# Patient Record
Sex: Female | Born: 1940 | Race: Black or African American | Hispanic: No | Smoking: Never smoker
Health system: Southern US, Community
[De-identification: ages and names within clinical notes are randomized; demographics above are authoritative.]

## PROBLEM LIST (undated history)

## (undated) DIAGNOSIS — I10 Essential (primary) hypertension: Secondary | ICD-10-CM

## (undated) DIAGNOSIS — L309 Dermatitis, unspecified: Secondary | ICD-10-CM

## (undated) DIAGNOSIS — R7303 Prediabetes: Secondary | ICD-10-CM

## (undated) DIAGNOSIS — H409 Unspecified glaucoma: Secondary | ICD-10-CM

## (undated) DIAGNOSIS — R202 Paresthesia of skin: Secondary | ICD-10-CM

## (undated) DIAGNOSIS — Z9289 Personal history of other medical treatment: Secondary | ICD-10-CM

## (undated) DIAGNOSIS — T4145XA Adverse effect of unspecified anesthetic, initial encounter: Secondary | ICD-10-CM

## (undated) DIAGNOSIS — M1990 Unspecified osteoarthritis, unspecified site: Secondary | ICD-10-CM

## (undated) DIAGNOSIS — R413 Other amnesia: Secondary | ICD-10-CM

## (undated) DIAGNOSIS — Z9109 Other allergy status, other than to drugs and biological substances: Secondary | ICD-10-CM

## (undated) DIAGNOSIS — J45909 Unspecified asthma, uncomplicated: Secondary | ICD-10-CM

## (undated) DIAGNOSIS — E785 Hyperlipidemia, unspecified: Secondary | ICD-10-CM

## (undated) DIAGNOSIS — T8859XA Other complications of anesthesia, initial encounter: Secondary | ICD-10-CM

## (undated) DIAGNOSIS — N6320 Unspecified lump in the left breast, unspecified quadrant: Principal | ICD-10-CM

## (undated) HISTORY — PX: BREAST CYST ASPIRATION: SHX578

## (undated) HISTORY — DX: Essential (primary) hypertension: I10

## (undated) HISTORY — PX: GANGLION CYST EXCISION: SHX1691

## (undated) HISTORY — PX: DILATION AND CURETTAGE OF UTERUS: SHX78

## (undated) HISTORY — DX: Unspecified lump in the left breast, unspecified quadrant: N63.20

## (undated) HISTORY — PX: BREAST BIOPSY: SHX20

## (undated) HISTORY — DX: Other amnesia: R41.3

## (undated) HISTORY — PX: CERVICAL LAMINECTOMY: SHX94

---

## 2000-01-11 ENCOUNTER — Ambulatory Visit (HOSPITAL_BASED_OUTPATIENT_CLINIC_OR_DEPARTMENT_OTHER): Admission: RE | Admit: 2000-01-11 | Discharge: 2000-01-11 | Payer: Self-pay | Admitting: Orthopedic Surgery

## 2000-04-19 ENCOUNTER — Encounter: Payer: Self-pay | Admitting: Emergency Medicine

## 2000-04-19 ENCOUNTER — Emergency Department (HOSPITAL_COMMUNITY): Admission: EM | Admit: 2000-04-19 | Discharge: 2000-04-19 | Payer: Self-pay | Admitting: Emergency Medicine

## 2000-05-15 ENCOUNTER — Encounter: Admission: RE | Admit: 2000-05-15 | Discharge: 2000-06-02 | Payer: Self-pay | Admitting: Orthopaedic Surgery

## 2000-06-15 ENCOUNTER — Encounter: Admission: RE | Admit: 2000-06-15 | Discharge: 2000-07-13 | Payer: Self-pay | Admitting: Orthopaedic Surgery

## 2005-03-21 HISTORY — PX: JOINT REPLACEMENT: SHX530

## 2005-03-23 ENCOUNTER — Inpatient Hospital Stay (HOSPITAL_COMMUNITY): Admission: RE | Admit: 2005-03-23 | Discharge: 2005-03-28 | Payer: Self-pay | Admitting: Orthopedic Surgery

## 2005-03-23 ENCOUNTER — Ambulatory Visit: Payer: Self-pay | Admitting: Physical Medicine & Rehabilitation

## 2005-03-28 ENCOUNTER — Inpatient Hospital Stay
Admission: RE | Admit: 2005-03-28 | Discharge: 2005-04-02 | Payer: Self-pay | Admitting: Physical Medicine & Rehabilitation

## 2005-05-17 ENCOUNTER — Encounter: Admission: RE | Admit: 2005-05-17 | Discharge: 2005-07-06 | Payer: Self-pay | Admitting: Orthopedic Surgery

## 2012-06-08 ENCOUNTER — Ambulatory Visit
Admission: RE | Admit: 2012-06-08 | Discharge: 2012-06-08 | Disposition: A | Discharge status: Home / Self Care | Payer: Self-pay | Source: Ambulatory Visit | Attending: Internal Medicine | Admitting: Internal Medicine

## 2012-06-08 ENCOUNTER — Other Ambulatory Visit: Payer: Self-pay | Admitting: Internal Medicine

## 2012-06-08 DIAGNOSIS — R05 Cough: Secondary | ICD-10-CM

## 2012-06-08 DIAGNOSIS — R059 Cough, unspecified: Secondary | ICD-10-CM

## 2014-10-27 ENCOUNTER — Other Ambulatory Visit (HOSPITAL_COMMUNITY): Payer: Self-pay | Admitting: Orthopaedic Surgery

## 2014-11-11 NOTE — Patient Instructions (Addendum)
YOUR PROCEDURE IS SCHEDULED ON : 11/21/14  REPORT TO [facility] MAIN ENTRANCE FOLLOW SIGNS TO EAST ELEVATOR - GO TO 3rd FLOOR CHECK IN AT 3 EAST NURSES STATION (SHORT STAY) AT: 10:30 AM  CALL THIS NUMBER IF YOU HAVE PROBLEMS THE MORNING OF SURGERY [phone]  REMEMBER:ONLY 1 PER PERSON MAY GO TO SHORT STAY WITH YOU TO GET READY THE MORNING OF YOUR SURGERY  DO NOT EAT FOOD OR DRINK LIQUIDS AFTER MIDNIGHT  TAKE THESE MEDICINES THE MORNING OF SURGERY: NIFEDIPINE / USE EYE DROPS AS NORMAL  YOU MAY NOT HAVE ANY METAL ON YOUR BODY INCLUDING HAIR PINS AND PIERCING'S. DO NOT WEAR JEWELRY, MAKEUP, LOTIONS, POWDERS OR PERFUMES. DO NOT WEAR NAIL POLISH. DO NOT SHAVE 48 HRS PRIOR TO SURGERY. MEN MAY SHAVE FACE AND NECK.  DO NOT [place]. [facility] IS NOT RESPONSIBLE FOR VALUABLES.  CONTACTS, DENTURES OR PARTIALS MAY NOT BE WORN TO SURGERY. LEAVE SUITCASE IN CAR. CAN BE BROUGHT TO ROOM AFTER SURGERY.  PATIENTS DISCHARGED THE DAY OF SURGERY WILL NOT BE ALLOWED TO DRIVE HOME.  PLEASE READ OVER THE FOLLOWING INSTRUCTION SHEETS _________________________________________________________________________________                                          [place] - PREPARING FOR SURGERY  Before surgery, you can play an important role.  Because skin is not sterile, your skin needs to be as free of germs as possible.  You can reduce the number of germs on your skin by washing with CHG (chlorahexidine gluconate) soap before surgery.  CHG is an antiseptic cleaner which kills germs and bonds with the skin to continue killing germs even after washing. Please DO NOT use if you have an allergy to CHG or antibacterial soaps.  If your skin becomes reddened/irritated stop using the CHG and inform your nurse when you arrive at Short Stay. Do not shave (including legs and underarms) for at least 48 hours prior to the first CHG shower.  You may shave your face. Please follow  these instructions carefully:   1.  Shower with CHG Soap the night before surgery and the  morning of Surgery.   2.  If you choose to wash your hair, wash your hair first as usual with your  normal  Shampoo.   3.  After you shampoo, rinse your hair and body thoroughly to remove the  shampoo.                                         4.  Use CHG as you would any other liquid soap.  You can apply chg directly  to the skin and wash . Gently wash with scrungie or clean wascloth    5.  Apply the CHG Soap to your body ONLY FROM THE NECK DOWN.   Do not use on open                           Wound or open sores. Avoid contact with eyes, ears mouth and genitals (private parts).                        Genitals (private parts) with your  normal soap.              6.  Wash thoroughly, paying special attention to the area where your surgery  will be performed.   7.  Thoroughly rinse your body with warm water from the neck down.   8.  DO NOT shower/wash with your normal soap after using and rinsing off  the CHG Soap .                9.  Pat yourself dry with a clean towel.             10.  Wear clean night clothes to bed after shower             11.  Place clean sheets on your bed the night of your first shower and do not  sleep with pets.  Day of Surgery : Do not apply any lotions/deodorants the morning of surgery.  Please wear clean clothes to the hospital/surgery center.  FAILURE TO FOLLOW THESE INSTRUCTIONS MAY RESULT IN THE CANCELLATION OF YOUR SURGERY    PATIENT SIGNATURE_________________________________  ______________________________________________________________________

## 2014-11-12 ENCOUNTER — Encounter (HOSPITAL_COMMUNITY): Payer: Self-pay

## 2014-11-12 ENCOUNTER — Encounter (HOSPITAL_COMMUNITY)
Admission: RE | Admit: 2014-11-12 | Discharge: 2014-11-12 | Disposition: A | Discharge status: Home / Self Care | Payer: Medicare Other | Source: Ambulatory Visit | Attending: Orthopaedic Surgery | Admitting: Orthopaedic Surgery

## 2014-11-12 ENCOUNTER — Encounter (HOSPITAL_COMMUNITY): Payer: Self-pay | Admitting: *Deleted

## 2014-11-12 ENCOUNTER — Other Ambulatory Visit: Payer: Self-pay

## 2014-11-12 DIAGNOSIS — J45909 Unspecified asthma, uncomplicated: Secondary | ICD-10-CM | POA: Diagnosis not present

## 2014-11-12 DIAGNOSIS — H409 Unspecified glaucoma: Secondary | ICD-10-CM | POA: Insufficient documentation

## 2014-11-12 DIAGNOSIS — E119 Type 2 diabetes mellitus without complications: Secondary | ICD-10-CM | POA: Diagnosis not present

## 2014-11-12 DIAGNOSIS — Z01812 Encounter for preprocedural laboratory examination: Secondary | ICD-10-CM | POA: Diagnosis present

## 2014-11-12 DIAGNOSIS — I10 Essential (primary) hypertension: Secondary | ICD-10-CM | POA: Insufficient documentation

## 2014-11-12 DIAGNOSIS — Z01810 Encounter for preprocedural cardiovascular examination: Secondary | ICD-10-CM | POA: Insufficient documentation

## 2014-11-12 DIAGNOSIS — L309 Dermatitis, unspecified: Secondary | ICD-10-CM | POA: Diagnosis not present

## 2014-11-12 DIAGNOSIS — E785 Hyperlipidemia, unspecified: Secondary | ICD-10-CM | POA: Diagnosis not present

## 2014-11-12 HISTORY — DX: Other complications of anesthesia, initial encounter: T88.59XA

## 2014-11-12 HISTORY — DX: Unspecified asthma, uncomplicated: J45.909

## 2014-11-12 HISTORY — DX: Personal history of other medical treatment: Z92.89

## 2014-11-12 HISTORY — DX: Unspecified osteoarthritis, unspecified site: M19.90

## 2014-11-12 HISTORY — DX: Paresthesia of skin: R20.2

## 2014-11-12 HISTORY — DX: Dermatitis, unspecified: L30.9

## 2014-11-12 HISTORY — DX: Hyperlipidemia, unspecified: E78.5

## 2014-11-12 HISTORY — DX: Adverse effect of unspecified anesthetic, initial encounter: T41.45XA

## 2014-11-12 HISTORY — DX: Other allergy status, other than to drugs and biological substances: Z91.09

## 2014-11-12 HISTORY — DX: Prediabetes: R73.03

## 2014-11-12 HISTORY — DX: Essential (primary) hypertension: I10

## 2014-11-12 LAB — BASIC METABOLIC PANEL
ANION GAP: 10 (ref 5–15)
BUN: 10 mg/dL (ref 6–20)
CALCIUM: 8.8 mg/dL — AB (ref 8.9–10.3)
CO2: 28 mmol/L (ref 22–32)
CREATININE: 0.61 mg/dL (ref 0.44–1.00)
Chloride: 104 mmol/L (ref 101–111)
GFR calc Af Amer: >60 mL/min (ref >60)
GLUCOSE: 109 mg/dL — AB (ref 65–99)
Potassium: 3.8 mmol/L (ref 3.5–5.1)
Sodium: 142 mmol/L (ref 135–145)

## 2014-11-12 LAB — APTT: APTT: 32 s (ref 24–37)

## 2014-11-12 LAB — CBC
HCT: 40.4 % (ref 36.0–46.0)
HEMOGLOBIN: 13.1 g/dL (ref 12.0–15.0)
MCH: 28.6 pg (ref 26.0–34.0)
MCHC: 32.4 g/dL (ref 30.0–36.0)
MCV: 88.2 fL (ref 78.0–100.0)
Platelets: 227 10*3/uL (ref 150–400)
RBC: 4.58 MIL/uL (ref 3.87–5.11)
RDW: 14.2 % (ref 11.5–15.5)
WBC: 4.4 10*3/uL (ref 4.0–10.5)

## 2014-11-12 LAB — ABO/RH: ABO/RH(D): O POS

## 2014-11-12 LAB — PROTIME-INR
INR: 1.23 (ref 0.00–1.49)
Prothrombin Time: 15.7 seconds — ABNORMAL HIGH (ref 11.6–15.2)

## 2014-11-12 LAB — SURGICAL PCR SCREEN
MRSA, PCR: NEGATIVE
STAPHYLOCOCCUS AUREUS: NEGATIVE

## 2014-11-13 ENCOUNTER — Encounter (HOSPITAL_COMMUNITY): Payer: Self-pay

## 2014-11-13 NOTE — Progress Notes (Signed)
EKG reviewed by Dr.Turk - no further action needed at this time

## 2014-11-21 ENCOUNTER — Inpatient Hospital Stay (HOSPITAL_COMMUNITY): Payer: Medicare Other

## 2014-11-21 ENCOUNTER — Encounter (HOSPITAL_COMMUNITY)
Admission: AD | Disposition: A | Discharge status: Skilled Nursing Facility | Payer: Self-pay | Source: Ambulatory Visit | Attending: Orthopaedic Surgery

## 2014-11-21 ENCOUNTER — Inpatient Hospital Stay (HOSPITAL_COMMUNITY): Payer: Medicare Other | Admitting: Anesthesiology

## 2014-11-21 ENCOUNTER — Inpatient Hospital Stay (HOSPITAL_COMMUNITY)
Admission: AD | Admit: 2014-11-21 | Discharge: 2014-11-25 | DRG: 470 | Disposition: A | Discharge status: Skilled Nursing Facility | Payer: Medicare Other | Source: Ambulatory Visit | Attending: Orthopaedic Surgery | Admitting: Orthopaedic Surgery

## 2014-11-21 ENCOUNTER — Encounter (HOSPITAL_COMMUNITY): Payer: Self-pay | Admitting: *Deleted

## 2014-11-21 DIAGNOSIS — E785 Hyperlipidemia, unspecified: Secondary | ICD-10-CM | POA: Diagnosis present

## 2014-11-21 DIAGNOSIS — Z419 Encounter for procedure for purposes other than remedying health state, unspecified: Secondary | ICD-10-CM

## 2014-11-21 DIAGNOSIS — Z01812 Encounter for preprocedural laboratory examination: Secondary | ICD-10-CM

## 2014-11-21 DIAGNOSIS — I10 Essential (primary) hypertension: Secondary | ICD-10-CM | POA: Diagnosis present

## 2014-11-21 DIAGNOSIS — Z96641 Presence of right artificial hip joint: Secondary | ICD-10-CM

## 2014-11-21 DIAGNOSIS — D62 Acute posthemorrhagic anemia: Secondary | ICD-10-CM | POA: Diagnosis not present

## 2014-11-21 DIAGNOSIS — M1611 Unilateral primary osteoarthritis, right hip: Principal | ICD-10-CM | POA: Diagnosis present

## 2014-11-21 DIAGNOSIS — Z96642 Presence of left artificial hip joint: Secondary | ICD-10-CM | POA: Diagnosis present

## 2014-11-21 DIAGNOSIS — M25551 Pain in right hip: Secondary | ICD-10-CM | POA: Diagnosis present

## 2014-11-21 DIAGNOSIS — R52 Pain, unspecified: Secondary | ICD-10-CM

## 2014-11-21 HISTORY — DX: Unspecified glaucoma: H40.9

## 2014-11-21 HISTORY — PX: TOTAL HIP ARTHROPLASTY: SHX124

## 2014-11-21 LAB — PROTIME-INR
INR: 1.17 (ref 0.00–1.49)
PROTHROMBIN TIME: 15.1 s (ref 11.6–15.2)

## 2014-11-21 LAB — GLUCOSE, CAPILLARY: GLUCOSE-CAPILLARY: 101 mg/dL — AB (ref 65–99)

## 2014-11-21 LAB — TYPE AND SCREEN
ABO/RH(D): O POS
ANTIBODY SCREEN: NEGATIVE

## 2014-11-21 SURGERY — ARTHROPLASTY, HIP, TOTAL, ANTERIOR APPROACH
Anesthesia: Spinal | Site: Hip | Laterality: Right

## 2014-11-21 MED ORDER — METOCLOPRAMIDE HCL 10 MG PO TABS: 5.0000 mg | ORAL_TABLET | Freq: Three times a day (TID) | ORAL | Status: DC | PRN

## 2014-11-21 MED ORDER — ALUM & MAG HYDROXIDE-SIMETH 200-200-20 MG/5ML PO SUSP: 30.0000 mL | ORAL | Status: DC | PRN

## 2014-11-21 MED ORDER — DORZOLAMIDE HCL-TIMOLOL MAL 2-0.5 % OP SOLN
1.0000 [drp] | Freq: Two times a day (BID) | OPHTHALMIC | Status: DC
  Administered 2014-11-21 – 2014-11-25 (×8): 1 [drp] via OPHTHALMIC
  Filled 2014-11-21: qty 10

## 2014-11-21 MED ORDER — NIFEDIPINE ER 30 MG PO TB24
30.0000 mg | ORAL_TABLET | Freq: Every morning | ORAL | Status: DC
  Administered 2014-11-22 – 2014-11-25 (×4): 30 mg via ORAL
  Filled 2014-11-21 (×5): qty 1

## 2014-11-21 MED ORDER — MENTHOL 3 MG MT LOZG: 1.0000 | LOZENGE | OROMUCOSAL | Status: DC | PRN

## 2014-11-21 MED ORDER — LIDOCAINE HCL (CARDIAC) 20 MG/ML IV SOLN
INTRAVENOUS | Status: AC
  Filled 2014-11-21: qty 5

## 2014-11-21 MED ORDER — FENTANYL CITRATE (PF) 100 MCG/2ML IJ SOLN
25.0000 ug | INTRAMUSCULAR | Status: DC | PRN
  Administered 2014-11-21 (×2): 25 ug via INTRAVENOUS

## 2014-11-21 MED ORDER — MOMETASONE FURO-FORMOTEROL FUM 100-5 MCG/ACT IN AERO
2.0000 | INHALATION_SPRAY | Freq: Two times a day (BID) | RESPIRATORY_TRACT | Status: DC
  Administered 2014-11-21 – 2014-11-25 (×8): 2 via RESPIRATORY_TRACT
  Filled 2014-11-21: qty 8.8

## 2014-11-21 MED ORDER — LACTATED RINGERS IV SOLN: INTRAVENOUS | Status: DC

## 2014-11-21 MED ORDER — SUCCINYLCHOLINE CHLORIDE 20 MG/ML IJ SOLN
INTRAMUSCULAR | Status: DC | PRN
  Administered 2014-11-21: 100 mg via INTRAVENOUS

## 2014-11-21 MED ORDER — ONDANSETRON HCL 4 MG PO TABS: 4.0000 mg | ORAL_TABLET | Freq: Four times a day (QID) | ORAL | Status: DC | PRN

## 2014-11-21 MED ORDER — OXYCODONE HCL 5 MG PO TABS
5.0000 mg | ORAL_TABLET | ORAL | Status: DC | PRN
  Administered 2014-11-21: 5 mg via ORAL
  Administered 2014-11-21: 10 mg via ORAL
  Administered 2014-11-22 (×2): 5 mg via ORAL
  Filled 2014-11-21 (×2): qty 2
  Filled 2014-11-21 (×3): qty 1

## 2014-11-21 MED ORDER — PHENYLEPHRINE HCL 10 MG/ML IJ SOLN
INTRAMUSCULAR | Status: DC | PRN
  Administered 2014-11-21: 80 ug via INTRAVENOUS
  Administered 2014-11-21: 120 ug via INTRAVENOUS
  Administered 2014-11-21 (×2): 40 ug via INTRAVENOUS
  Administered 2014-11-21: 80 ug via INTRAVENOUS

## 2014-11-21 MED ORDER — DIPHENHYDRAMINE HCL 12.5 MG/5ML PO ELIX: 12.5000 mg | ORAL_SOLUTION | ORAL | Status: DC | PRN

## 2014-11-21 MED ORDER — SODIUM CHLORIDE 0.9 % IV SOLN
INTRAVENOUS | Status: DC
  Administered 2014-11-21 – 2014-11-22 (×2): via INTRAVENOUS

## 2014-11-21 MED ORDER — FENTANYL CITRATE (PF) 100 MCG/2ML IJ SOLN
INTRAMUSCULAR | Status: DC | PRN
  Administered 2014-11-21 (×2): 50 ug via INTRAVENOUS

## 2014-11-21 MED ORDER — ONDANSETRON HCL 4 MG/2ML IJ SOLN: 4.0000 mg | Freq: Four times a day (QID) | INTRAMUSCULAR | Status: DC | PRN

## 2014-11-21 MED ORDER — DOCUSATE SODIUM 100 MG PO CAPS
100.0000 mg | ORAL_CAPSULE | Freq: Two times a day (BID) | ORAL | Status: DC
  Administered 2014-11-21 – 2014-11-25 (×7): 100 mg via ORAL
  Filled 2014-11-21 (×5): qty 1

## 2014-11-21 MED ORDER — ONDANSETRON HCL 4 MG/2ML IJ SOLN
INTRAMUSCULAR | Status: DC | PRN
  Administered 2014-11-21: 4 mg via INTRAVENOUS

## 2014-11-21 MED ORDER — PROPOFOL 10 MG/ML IV BOLUS
INTRAVENOUS | Status: AC
  Filled 2014-11-21: qty 20

## 2014-11-21 MED ORDER — ACETAMINOPHEN 650 MG RE SUPP: 650.0000 mg | Freq: Four times a day (QID) | RECTAL | Status: DC | PRN

## 2014-11-21 MED ORDER — PHENYLEPHRINE 40 MCG/ML (10ML) SYRINGE FOR IV PUSH (FOR BLOOD PRESSURE SUPPORT)
PREFILLED_SYRINGE | INTRAVENOUS | Status: AC
  Filled 2014-11-21: qty 10

## 2014-11-21 MED ORDER — FENTANYL CITRATE (PF) 100 MCG/2ML IJ SOLN
INTRAMUSCULAR | Status: AC
  Filled 2014-11-21: qty 2

## 2014-11-21 MED ORDER — GLYCOPYRROLATE 0.2 MG/ML IJ SOLN
INTRAMUSCULAR | Status: AC
  Filled 2014-11-21: qty 3

## 2014-11-21 MED ORDER — NEOSTIGMINE METHYLSULFATE 10 MG/10ML IV SOLN
INTRAVENOUS | Status: DC | PRN
  Administered 2014-11-21: 3 mg via INTRAVENOUS

## 2014-11-21 MED ORDER — LIDOCAINE HCL (CARDIAC) 20 MG/ML IV SOLN
INTRAVENOUS | Status: DC | PRN
  Administered 2014-11-21: 50 mg via INTRAVENOUS

## 2014-11-21 MED ORDER — METHOCARBAMOL 500 MG PO TABS
500.0000 mg | ORAL_TABLET | Freq: Four times a day (QID) | ORAL | Status: DC | PRN
  Administered 2014-11-22 – 2014-11-25 (×6): 500 mg via ORAL
  Filled 2014-11-21 (×6): qty 1

## 2014-11-21 MED ORDER — POTASSIUM CHLORIDE CRYS ER 10 MEQ PO TBCR
10.0000 meq | EXTENDED_RELEASE_TABLET | Freq: Every day | ORAL | Status: DC
  Administered 2014-11-21 – 2014-11-25 (×5): 10 meq via ORAL
  Filled 2014-11-21 (×6): qty 1

## 2014-11-21 MED ORDER — LACTATED RINGERS IV SOLN
INTRAVENOUS | Status: DC
  Administered 2014-11-21 (×2): via INTRAVENOUS
  Administered 2014-11-21: 1000 mL via INTRAVENOUS

## 2014-11-21 MED ORDER — ROCURONIUM BROMIDE 100 MG/10ML IV SOLN
INTRAVENOUS | Status: DC | PRN
Start: 2014-11-21 — End: 2014-11-21
  Administered 2014-11-21 (×2): 10 mg via INTRAVENOUS
  Administered 2014-11-21: 20 mg via INTRAVENOUS

## 2014-11-21 MED ORDER — ONDANSETRON HCL 4 MG/2ML IJ SOLN
INTRAMUSCULAR | Status: AC
  Filled 2014-11-21: qty 2

## 2014-11-21 MED ORDER — METOPROLOL TARTRATE 1 MG/ML IV SOLN
INTRAVENOUS | Status: DC | PRN
  Administered 2014-11-21: 2 mg via INTRAVENOUS

## 2014-11-21 MED ORDER — GLYCOPYRROLATE 0.2 MG/ML IJ SOLN
INTRAMUSCULAR | Status: DC | PRN
  Administered 2014-11-21: 0.3 mg via INTRAVENOUS

## 2014-11-21 MED ORDER — HYDROMORPHONE HCL 1 MG/ML IJ SOLN
1.0000 mg | INTRAMUSCULAR | Status: DC | PRN
  Administered 2014-11-21: 1 mg via INTRAVENOUS
  Administered 2014-11-21: 0.25 mg via INTRAVENOUS
  Filled 2014-11-21 (×2): qty 1

## 2014-11-21 MED ORDER — ACETAMINOPHEN 325 MG PO TABS
650.0000 mg | ORAL_TABLET | Freq: Four times a day (QID) | ORAL | Status: DC | PRN
  Administered 2014-11-22 – 2014-11-25 (×9): 650 mg via ORAL
  Filled 2014-11-21 (×9): qty 2

## 2014-11-21 MED ORDER — PHENOL 1.4 % MT LIQD: 1.0000 | OROMUCOSAL | Status: DC | PRN

## 2014-11-21 MED ORDER — MEPERIDINE HCL 50 MG/ML IJ SOLN: 6.2500 mg | INTRAMUSCULAR | Status: DC | PRN

## 2014-11-21 MED ORDER — CEFAZOLIN SODIUM-DEXTROSE 2-3 GM-% IV SOLR
2.0000 g | INTRAVENOUS | Status: AC
  Administered 2014-11-21: 2 g via INTRAVENOUS

## 2014-11-21 MED ORDER — PROPOFOL 10 MG/ML IV BOLUS
INTRAVENOUS | Status: DC | PRN
  Administered 2014-11-21: 20 mg via INTRAVENOUS
  Administered 2014-11-21: 100 mg via INTRAVENOUS
  Administered 2014-11-21: 20 mg via INTRAVENOUS
  Administered 2014-11-21: 50 mg via INTRAVENOUS

## 2014-11-21 MED ORDER — ASPIRIN EC 325 MG PO TBEC
325.0000 mg | DELAYED_RELEASE_TABLET | Freq: Two times a day (BID) | ORAL | Status: DC
  Administered 2014-11-22 – 2014-11-25 (×7): 325 mg via ORAL
  Filled 2014-11-21 (×10): qty 1

## 2014-11-21 MED ORDER — BUPIVACAINE HCL (PF) 0.75 % IJ SOLN
INTRAMUSCULAR | Status: DC | PRN
  Administered 2014-11-21: 2 mL via INTRATHECAL

## 2014-11-21 MED ORDER — METHOCARBAMOL 1000 MG/10ML IJ SOLN
500.0000 mg | Freq: Four times a day (QID) | INTRAVENOUS | Status: DC | PRN
  Administered 2014-11-21 (×2): 500 mg via INTRAVENOUS
  Filled 2014-11-21 (×4): qty 5

## 2014-11-21 MED ORDER — METOCLOPRAMIDE HCL 5 MG/ML IJ SOLN: 5.0000 mg | Freq: Three times a day (TID) | INTRAMUSCULAR | Status: DC | PRN

## 2014-11-21 MED ORDER — LEFLUNOMIDE 20 MG PO TABS
20.0000 mg | ORAL_TABLET | Freq: Every day | ORAL | Status: DC
  Administered 2014-11-21 – 2014-11-25 (×5): 20 mg via ORAL
  Filled 2014-11-21 (×6): qty 1

## 2014-11-21 MED ORDER — ROSUVASTATIN CALCIUM 20 MG PO TABS
20.0000 mg | ORAL_TABLET | Freq: Every day | ORAL | Status: DC
  Administered 2014-11-21 – 2014-11-25 (×5): 20 mg via ORAL
  Filled 2014-11-21 (×6): qty 1

## 2014-11-21 MED ORDER — SODIUM CHLORIDE 0.9 % IR SOLN
Status: DC | PRN
  Administered 2014-11-21: 1000 mL

## 2014-11-21 MED ORDER — TRANEXAMIC ACID 1000 MG/10ML IV SOLN
1000.0000 mg | INTRAVENOUS | Status: AC
  Administered 2014-11-21: 1000 mg via INTRAVENOUS
  Filled 2014-11-21: qty 10

## 2014-11-21 MED ORDER — ZOLPIDEM TARTRATE 5 MG PO TABS: 5.0000 mg | ORAL_TABLET | Freq: Every evening | ORAL | Status: DC | PRN

## 2014-11-21 MED ORDER — NEOSTIGMINE METHYLSULFATE 10 MG/10ML IV SOLN
INTRAVENOUS | Status: AC
  Filled 2014-11-21: qty 1

## 2014-11-21 MED ORDER — CEFAZOLIN SODIUM-DEXTROSE 2-3 GM-% IV SOLR
INTRAVENOUS | Status: AC
  Filled 2014-11-21: qty 50

## 2014-11-21 MED ORDER — PHENYLEPHRINE HCL 10 MG/ML IJ SOLN
INTRAMUSCULAR | Status: AC
  Filled 2014-11-21: qty 1

## 2014-11-21 MED ORDER — PROMETHAZINE HCL 25 MG/ML IJ SOLN: 6.2500 mg | INTRAMUSCULAR | Status: DC | PRN

## 2014-11-21 MED ORDER — DEXTROSE 5 % IV SOLN
10.0000 mg | INTRAVENOUS | Status: DC | PRN
  Administered 2014-11-21: 50 ug/min via INTRAVENOUS

## 2014-11-21 MED ORDER — CEFAZOLIN SODIUM 1-5 GM-% IV SOLN
1.0000 g | Freq: Four times a day (QID) | INTRAVENOUS | Status: AC
  Administered 2014-11-21 (×2): 1 g via INTRAVENOUS
  Filled 2014-11-21 (×2): qty 50

## 2014-11-21 MED ORDER — FENTANYL CITRATE (PF) 100 MCG/2ML IJ SOLN
INTRAMUSCULAR | Status: AC
  Filled 2014-11-21: qty 4

## 2014-11-21 MED ORDER — HYDROCHLOROTHIAZIDE 25 MG PO TABS
25.0000 mg | ORAL_TABLET | Freq: Every morning | ORAL | Status: DC
  Administered 2014-11-23 – 2014-11-25 (×3): 25 mg via ORAL
  Filled 2014-11-21 (×5): qty 1

## 2014-11-21 MED ORDER — PROPOFOL INFUSION 10 MG/ML OPTIME
INTRAVENOUS | Status: DC | PRN
  Administered 2014-11-21: 25 ug/kg/min via INTRAVENOUS

## 2014-11-21 MED ORDER — ROCURONIUM BROMIDE 100 MG/10ML IV SOLN
INTRAVENOUS | Status: AC
  Filled 2014-11-21: qty 1

## 2014-11-21 MED ORDER — LATANOPROST 0.005 % OP SOLN
1.0000 [drp] | Freq: Every day | OPHTHALMIC | Status: DC
  Administered 2014-11-21 – 2014-11-24 (×4): 1 [drp] via OPHTHALMIC
  Filled 2014-11-21: qty 2.5

## 2014-11-21 SURGICAL SUPPLY — 43 items
BAG ZIPLOCK 12X15 (MISCELLANEOUS) IMPLANT
BENZOIN TINCTURE PRP APPL 2/3 (GAUZE/BANDAGES/DRESSINGS) ×3 IMPLANT
BLADE SAW SGTL 18X1.27X75 (BLADE) ×2 IMPLANT
BLADE SAW SGTL 18X1.27X75MM (BLADE) ×1
CAPT HIP TOTAL 2 ×3 IMPLANT
CELLS DAT CNTRL 66122 CELL SVR (MISCELLANEOUS) ×1 IMPLANT
CLOSURE WOUND 1/2 X4 (GAUZE/BANDAGES/DRESSINGS) ×1
COVER PERINEAL POST (MISCELLANEOUS) ×3 IMPLANT
DRAPE C-ARM 42X120 X-RAY (DRAPES) ×3 IMPLANT
DRAPE STERI IOBAN 125X83 (DRAPES) ×3 IMPLANT
DRAPE U-SHAPE 47X51 STRL (DRAPES) ×9 IMPLANT
DRSG AQUACEL AG ADV 3.5X 6 (GAUZE/BANDAGES/DRESSINGS) ×3 IMPLANT
DRSG AQUACEL AG ADV 3.5X10 (GAUZE/BANDAGES/DRESSINGS) ×3 IMPLANT
DURAPREP 26ML APPLICATOR (WOUND CARE) ×3 IMPLANT
ELECT BLADE TIP CTD 4 INCH (ELECTRODE) ×3 IMPLANT
ELECT REM PT RETURN 9FT ADLT (ELECTROSURGICAL) ×3
ELECTRODE REM PT RTRN 9FT ADLT (ELECTROSURGICAL) ×1 IMPLANT
FACESHIELD WRAPAROUND (MASK) ×12 IMPLANT
GAUZE XEROFORM 1X8 LF (GAUZE/BANDAGES/DRESSINGS) IMPLANT
GLOVE BIO SURGEON STRL SZ7.5 (GLOVE) ×3 IMPLANT
GLOVE BIOGEL PI IND STRL 8 (GLOVE) ×2 IMPLANT
GLOVE BIOGEL PI INDICATOR 8 (GLOVE) ×4
GLOVE ECLIPSE 8.0 STRL XLNG CF (GLOVE) ×3 IMPLANT
GOWN STRL REUS W/TWL XL LVL3 (GOWN DISPOSABLE) ×6 IMPLANT
HANDPIECE INTERPULSE COAX TIP (DISPOSABLE) ×2
KIT BASIN OR (CUSTOM PROCEDURE TRAY) ×3 IMPLANT
PACK TOTAL JOINT (CUSTOM PROCEDURE TRAY) ×3 IMPLANT
PEN SKIN MARKING BROAD (MISCELLANEOUS) ×3 IMPLANT
RTRCTR WOUND ALEXIS 18CM MED (MISCELLANEOUS) ×3
SET HNDPC FAN SPRY TIP SCT (DISPOSABLE) ×1 IMPLANT
STAPLER VISISTAT 35W (STAPLE) IMPLANT
STRIP CLOSURE SKIN 1/2X4 (GAUZE/BANDAGES/DRESSINGS) ×2 IMPLANT
SUT ETHIBOND NAB CT1 #1 30IN (SUTURE) ×3 IMPLANT
SUT MNCRL AB 4-0 PS2 18 (SUTURE) ×6 IMPLANT
SUT VIC AB 0 CT1 36 (SUTURE) ×3 IMPLANT
SUT VIC AB 1 CT1 36 (SUTURE) ×3 IMPLANT
SUT VIC AB 2-0 CT1 27 (SUTURE) ×4
SUT VIC AB 2-0 CT1 TAPERPNT 27 (SUTURE) ×2 IMPLANT
TOWEL OR 17X26 10 PK STRL BLUE (TOWEL DISPOSABLE) ×3 IMPLANT
TOWEL OR NON WOVEN STRL DISP B (DISPOSABLE) ×3 IMPLANT
TRAY FOLEY W/METER SILVER 14FR (SET/KITS/TRAYS/PACK) ×3 IMPLANT
TRAY FOLEY W/METER SILVER 16FR (SET/KITS/TRAYS/PACK) IMPLANT
YANKAUER SUCT BULB TIP 10FT TU (MISCELLANEOUS) ×3 IMPLANT

## 2014-11-21 NOTE — Anesthesia Procedure Notes (Addendum)
Spinal Patient location during procedure: OR Start time: 11/21/2014 12:25 PM End time: 11/21/2014 12:29 PM Staffing Anesthesiologist: Suella Broad D Performed by: anesthesiologist  Preanesthetic Checklist Completed: patient identified, site marked, surgical consent, pre-op evaluation, timeout performed, IV checked, risks and benefits discussed and monitors and equipment checked Spinal Block Patient position: sitting Prep: Betadine Patient monitoring: heart rate, continuous pulse ox, blood pressure and cardiac monitor Approach: midline Location: L4-5 Injection technique: single-shot Needle Needle type: Whitacre and Introducer  Needle gauge: 24 G Needle length: 9 cm Additional Notes Negative paresthesia. Negative blood return. Positive free-flowing CSF. Expiration date of kit checked and confirmed. Patient tolerated procedure well, without complications.    Procedure Name: Intubation Date/Time: 11/21/2014 12:46 PM Performed by: Deliah Boston Pre-anesthesia Checklist: Patient identified, Emergency Drugs available, Suction available and Patient being monitored Patient Re-evaluated:Patient Re-evaluated prior to inductionOxygen Delivery Method: Circle System Utilized Preoxygenation: Pre-oxygenation with 100% oxygen Intubation Type: IV induction Ventilation: Mask ventilation without difficulty Laryngoscope Size: Mac and 3 Grade View: Grade I Tube type: Oral Tube size: 7.0 mm Number of attempts: 1 Airway Equipment and Method: Stylet and Oral airway Placement Confirmation: ETT inserted through vocal cords under direct vision,  positive ETCO2 and breath sounds checked- equal and bilateral Secured at: 20 cm Tube secured with: Tape Dental Injury: Teeth and Oropharynx as per pre-operative assessment

## 2014-11-21 NOTE — Brief Op Note (Signed)
11/21/2014  2:00 PM  PATIENT:  Teresa French  74 y.o. female  PRE-OPERATIVE DIAGNOSIS:  Severe osteoarthritis right hip  POST-OPERATIVE DIAGNOSIS:  Severe osteoarthritis right hip  PROCEDURE:  Procedure(s): RIGHT TOTAL HIP ARTHROPLASTY ANTERIOR APPROACH (Right)  SURGEON:  Surgeon(s) and Role:    * Mcarthur Rossetti, MD - Primary  PHYSICIAN ASSISTANT: Benita Stabile, PA-C  ANESTHESIA:   spinal and general  EBL:  Total I/O In: 1000 [I.V.:1000] Out: 350 [Urine:250; Blood:100]  BLOOD ADMINISTERED:none  DRAINS: none   LOCAL MEDICATIONS USED:  NONE  SPECIMEN:  No Specimen  DISPOSITION OF SPECIMEN:  N/A  COUNTS:  YES  TOURNIQUET:  * No tourniquets in log *  DICTATION: .Other Dictation: Dictation Number [id number]  PLAN OF CARE: Admit to inpatient   PATIENT DISPOSITION:  PACU - hemodynamically stable.   Delay start of Pharmacological VTE agent (>24hrs) due to surgical blood loss or risk of bleeding: no

## 2014-11-21 NOTE — Transfer of Care (Signed)
Immediate Anesthesia Transfer of Care Note  Patient: Teresa French  Procedure(s) Performed: Procedure(s): RIGHT TOTAL HIP ARTHROPLASTY ANTERIOR APPROACH (Right)  Patient Location: PACU  Anesthesia Type:General and Spinal  Level of Consciousness: Patient easily awoken, sedated, comfortable, cooperative, following commands, responds to stimulation.   Airway & Oxygen Therapy: Patient spontaneously breathing, ventilating well, oxygen via simple oxygen mask.  Post-op Assessment: Report given to PACU RN, vital signs reviewed and stable, moving all extremities.   Post vital signs: Reviewed and stable.  Complications: No apparent anesthesia complications

## 2014-11-21 NOTE — Anesthesia Preprocedure Evaluation (Addendum)
Anesthesia Evaluation  Patient identified by MRN, date of birth, ID band Patient awake    Reviewed: Allergy & Precautions, NPO status , Patient's Chart, lab work & pertinent test results  Airway Mallampati: III  TM Distance: >3 FB Neck ROM: Full    Dental  (+) Upper Dentures, Partial Lower   Pulmonary asthma ,  breath sounds clear to auscultation        Cardiovascular hypertension, Pt. on medications Rhythm:Regular Rate:Normal     Neuro/Psych negative neurological ROS  negative psych ROS   GI/Hepatic negative GI ROS, Neg liver ROS,   Endo/Other  negative endocrine ROS  Renal/GU negative Renal ROS  negative genitourinary   Musculoskeletal  (+) Arthritis -, Osteoarthritis,    Abdominal   Peds negative pediatric ROS (+)  Hematology negative hematology ROS (+)   Anesthesia Other Findings   Reproductive/Obstetrics                            Lab Results  Component Value Date   WBC 4.4 11/12/2014   HGB 13.1 11/12/2014   HCT 40.4 11/12/2014   MCV 88.2 11/12/2014   PLT 227 11/12/2014   Lab Results  Component Value Date   CREATININE 0.61 11/12/2014   BUN 10 11/12/2014   NA 142 11/12/2014   K 3.8 11/12/2014   CL 104 11/12/2014   CO2 28 11/12/2014   Lab Results  Component Value Date   INR 1.23 11/12/2014   11/12/14: EKG: normal sinus rhythm, RBBB.   Anesthesia Physical Anesthesia Plan  ASA: II  Anesthesia Plan: Spinal   Post-op Pain Management:    Induction: Intravenous  Airway Management Planned: Natural Airway and Simple Face Mask  Additional Equipment:   Intra-op Plan:   Post-operative Plan:   Informed Consent: I have reviewed the patients History and Physical, chart, labs and discussed the procedure including the risks, benefits and alternatives for the proposed anesthesia with the patient or authorized representative who has indicated his/her understanding and  acceptance.   Dental advisory given  Plan Discussed with: CRNA  Anesthesia Plan Comments:         Anesthesia Quick Evaluation

## 2014-11-21 NOTE — H&P (Signed)
TOTAL HIP ADMISSION H&P  Patient is admitted for right total hip arthroplasty.  Subjective:  Chief Complaint: right hip pain  HPI: Teresa French, 74 y.o. female, has a history of pain and functional disability in the right hip(s) due to arthritis and patient has failed non-surgical conservative treatments for greater than 12 weeks to include NSAID's and/or analgesics, flexibility and strengthening excercises, use of assistive devices, weight reduction as appropriate and activity modification.  Onset of symptoms was gradual starting 3 years ago with gradually worsening course since that time.The patient noted no past surgery on the right hip(s).  Patient currently rates pain in the right hip at 9 out of 10 with activity. Patient has night pain, worsening of pain with activity and weight bearing, pain that interfers with activities of daily living, pain with passive range of motion and crepitus. Patient has evidence of subchondral cysts, subchondral sclerosis, periarticular osteophytes and joint space narrowing by imaging studies. This condition presents safety issues increasing the risk of falls.  There is no current active infection.  Patient Active Problem List   Diagnosis Date Noted  . Osteoarthritis of right hip 11/21/2014   Past Medical History  Diagnosis Date  . Complication of anesthesia     "I SLEEP A VERY VERY LONG TIME"  . Hypertension   . Hyperlipidemia   . Environmental allergies   . Arthritis   . Tingling     OF FEET  . Eczema   . History of transfusion   . Borderline diabetes   . Glaucoma   . Asthma     Past Surgical History  Procedure Laterality Date  . Ganglion cyst excision      X3  . Breast biopsy      3-5 EACH BREAST  . Dilation and curettage of uterus    . Cervical laminectomy    . Joint replacement  2007    left hip    No prescriptions prior to admission   No Known Allergies  Social History  Substance Use Topics  . Smoking status: Never Smoker   .  Smokeless tobacco: Not on file  . Alcohol Use: No    No family history on file.   Review of Systems  Musculoskeletal: Positive for joint pain.  All other systems reviewed and are negative.   Objective:  Physical Exam  Constitutional: She appears well-developed and well-nourished.  HENT:  Head: Normocephalic and atraumatic.  Eyes: EOM are normal. Pupils are equal, round, and reactive to light.  Neck: Normal range of motion. Neck supple.  Cardiovascular: Normal rate and regular rhythm.   Respiratory: Effort normal and breath sounds normal.  GI: Soft. Bowel sounds are normal.  Musculoskeletal:       Right hip: She exhibits decreased range of motion, decreased strength, tenderness, bony tenderness and crepitus.  Neurological: She is alert.  Skin: Skin is warm and dry.  Psychiatric: She has a normal mood and affect.    Vital signs in last 24 hours:    Labs:   There is no height or weight on file to calculate BMI.   Imaging Review Plain radiographs demonstrate severe degenerative joint disease of the right hip(s). The bone quality appears to be good for age and reported activity level.  Assessment/Plan:  End stage arthritis, right hip(s)  The patient history, physical examination, clinical judgement of the provider and imaging studies are consistent with end stage degenerative joint disease of the right hip(s) and total hip arthroplasty is deemed medically necessary. The  treatment options including medical management, injection therapy, arthroscopy and arthroplasty were discussed at length. The risks and benefits of total hip arthroplasty were presented and reviewed. The risks due to aseptic loosening, infection, stiffness, dislocation/subluxation,  thromboembolic complications and other imponderables were discussed.  The patient acknowledged the explanation, agreed to proceed with the plan and consent was signed. Patient is being admitted for inpatient treatment for surgery,  pain control, PT, OT, prophylactic antibiotics, VTE prophylaxis, progressive ambulation and ADL's and discharge planning.The patient is planning to be discharged home with home health services

## 2014-11-21 NOTE — Anesthesia Postprocedure Evaluation (Signed)
  Anesthesia Post-op Note  Patient: Teresa French  Procedure(s) Performed: Procedure(s): RIGHT TOTAL HIP ARTHROPLASTY ANTERIOR APPROACH (Right)  Patient Location: PACU  Anesthesia Type:General  Level of Consciousness: awake and alert   Airway and Oxygen Therapy: Patient Spontanous Breathing and Patient connected to nasal cannula oxygen  Post-op Pain: minimal  Post-op Assessment: Post-op Vital signs reviewed and Patient's Cardiovascular Status Stable LLE Motor Response: Purposeful movement LLE Sensation: Full sensation RLE Motor Response: Purposeful movement RLE Sensation: Full sensation L Sensory Level: S1-Sole of foot, small toes R Sensory Level: S1-Sole of foot, small toes  Post-op Vital Signs: Reviewed and stable  Last Vitals:  Filed Vitals:   11/21/14 1757  BP: 147/87  Pulse: 83  Temp: 36.7 C  Resp: 17    Complications: No apparent anesthesia complications

## 2014-11-22 LAB — BASIC METABOLIC PANEL
ANION GAP: 7 (ref 5–15)
BUN: 7 mg/dL (ref 6–20)
CALCIUM: 7.8 mg/dL — AB (ref 8.9–10.3)
CO2: 25 mmol/L (ref 22–32)
Chloride: 102 mmol/L (ref 101–111)
Creatinine, Ser: 0.61 mg/dL (ref 0.44–1.00)
GFR calc non Af Amer: >60 mL/min (ref >60)
Glucose, Bld: 135 mg/dL — ABNORMAL HIGH (ref 65–99)
POTASSIUM: 2.8 mmol/L — AB (ref 3.5–5.1)
Sodium: 134 mmol/L — ABNORMAL LOW (ref 135–145)

## 2014-11-22 LAB — CBC
HEMATOCRIT: 34.1 % — AB (ref 36.0–46.0)
HEMOGLOBIN: 10.8 g/dL — AB (ref 12.0–15.0)
MCH: 27.7 pg (ref 26.0–34.0)
MCHC: 31.7 g/dL (ref 30.0–36.0)
MCV: 87.4 fL (ref 78.0–100.0)
Platelets: 156 10*3/uL (ref 150–400)
RBC: 3.9 MIL/uL (ref 3.87–5.11)
RDW: 14.3 % (ref 11.5–15.5)
WBC: 7.8 10*3/uL (ref 4.0–10.5)

## 2014-11-22 MED ORDER — TRAMADOL HCL 50 MG PO TABS
50.0000 mg | ORAL_TABLET | Freq: Four times a day (QID) | ORAL | Status: DC | PRN
  Administered 2014-11-23: 50 mg via ORAL
  Filled 2014-11-22: qty 1

## 2014-11-22 MED ORDER — HYDROCODONE-ACETAMINOPHEN 5-325 MG PO TABS: 1.0000 | ORAL_TABLET | ORAL | Status: DC | PRN

## 2014-11-22 NOTE — Progress Notes (Signed)
Subjective: 1 Day Post-Op Procedure(s) (LRB): RIGHT TOTAL HIP ARTHROPLASTY ANTERIOR APPROACH (Right) Patient reports pain as moderate.  Lethargic from the pain meds.  Acute blood loss anemia from surgery, but vitals stable.  PT thus far recommending SNF psot hospital stay.  Objective: Vital signs in last 24 hours: Temp:  [97.7 F (36.5 C)-100.6 F (38.1 C)] 97.9 F (36.6 C) (09/03 1400) Pulse Rate:  [61-98] 97 (09/03 1400) Resp:  [14-28] 20 (09/03 1400) BP: (88-169)/(45-114) 154/114 mmHg (09/03 1400) SpO2:  [93 %-100 %] 95 % (09/03 1400)  Intake/Output from previous day: 09/02 0701 - 09/03 0700 In: 3985 [P.O.:1000; I.V.:2770; IV Piggyback:215] Out: 3000 [Urine:2900; Blood:100] Intake/Output this shift: Total I/O In: 240 [P.O.:240] Out: 100 [Urine:100]   Recent Labs  11/22/14 0445  HGB 10.8*    Recent Labs  11/22/14 0445  WBC 7.8  RBC 3.90  HCT 34.1*  PLT 156    Recent Labs  11/22/14 0445  NA 134*  K 2.8*  CL 102  CO2 25  BUN 7  CREATININE 0.61  GLUCOSE 135*  CALCIUM 7.8*    Recent Labs  11/21/14 1105  INR 1.17    Sensation intact distally Intact pulses distally Dorsiflexion/Plantar flexion intact Incision: dressing C/D/I Compartment soft  Assessment/Plan: 1 Day Post-Op Procedure(s) (LRB): RIGHT TOTAL HIP ARTHROPLASTY ANTERIOR APPROACH (Right) Up with therapy  [place]ila [place] Y 11/22/2014, 2:29 PM

## 2014-11-22 NOTE — Op Note (Signed)
NAME:  Teresa French, Teresa French              ACCOUNT NO.:  [id number]  MEDICAL RECORD NO.:  [id number]  LOCATION:  62                         FACILITY:  [facility]  PHYSICIAN:  Lind Guest. Ninfa Linden, M.D.DATE OF BIRTH:  30-Sep-1940  DATE OF PROCEDURE:  11/21/2014 DATE OF DISCHARGE:                              OPERATIVE REPORT   PREOPERATIVE DIAGNOSIS:  Primary osteoarthritis and degenerative joint disease, right hip.  POSTOPERATIVE DIAGNOSIS:  Primary osteoarthritis and degenerative joint disease, right hip.  PROCEDURE:  Right total hip arthroplasty through direct anterior approach.  IMPLANTS:  DePuy Sector Gription acetabular component size 52, size 36+ 0 neutral polyethylene liner, size 11 Corail femoral component with standard offset, size 36+ 1.5 ceramic hip ball.  SURGEON:  Lind Guest. Ninfa Linden, MD.  ASSISTANT:  Teresa Emery, PA-C  ANESTHESIA: 1. Attempted spinal. 2. General.  ANTIBIOTICS:  2 g IV Ancef.  BLOOD LOSS:  100 mL.  COMPLICATIONS:  None.  INDICATIONS:  Teresa French is a 74 year old female with history of a right hip osteoarthritis.  She underwent a left total hip arthroplasty through a posterior approach several years ago by my partner, who then referred to me for an anterior hip on her right side.  She has severe osteoarthritis of her right hip.  She has x-rays that show medialized femoral head with sclerotic changes, periarticular osteophytes, and significant joint space narrowing.  Her pain is daily and her mobility is limited, it detrimentally impacts her quality of life.  At this point, she does wish to proceed with a total hip arthroplasty on the right side through direct anterior approach.  She understands our goals are decreased pain, improved mobility, and overall improved quality of life.  She understands also the risk of acute blood loss anemia, nerve and vessel injury, fracture, infection, dislocation, DVT.  PROCEDURE DESCRIPTION:  After informed  consent was obtained, appropriate right hip was marked.  She was brought to the operating room where spinal anesthesia was attempted while she was on her stretcher.  She was laid in the supine position, but she had a lot of problems with [place] getting a Foley in and we finally were able to get the Foley in but had a lot of problems still lifting her leg and feeling like she was having pain, so we had to convert her to general anesthesia.  Traction boots were then placed on both of her feet.  She was placed supine on the Hana fracture table with perineal post in place and both legs in inline skeletal traction devices, but no traction applied.  Her right operative hip was then prepped and draped with DuraPrep and sterile drapes.  Time- out was called.  She was identified as correct patient, correct right hip.  Of note, her left hip and leg were significantly longer than on the right side as well.  We then made an incision inferior and posterior to the anterosuperior iliac spine and carried this obliquely down the leg.  We dissected down to the tensor fascia lata muscle and the tensor fascia was then divided longitudinally, so we could proceed with direct anterior approach to the hip.  We identified and cauterized the lateral femoral circumflex vessels and then  opened up the hip capsule in L type format finding a large joint effusion.  We placed Cobra retractors within the hip capsule and made a femoral neck cut with the oscillating saw proximal to the lesser trochanter.  We completed this on osteotome. We placed a corkscrew guide in the femoral head and removed the femoral head in its entirety and found to be completely devoid of cartilage.  We removed remnants of acetabular labrum as well as osteophytes around the acetabulum, put a Hohmann medially and released the transverse acetabular ligament.  Then, began reaming under direct visualization from a size 42 up to a size 52 with all reamers  under direct visualization and the last 2 reamers under direct fluoroscopy, so we could obtain our depth of reaming, our inclination, and anteversion. Once we were pleased with this, we placed a real DePuy Sector Gription acetabular component size 52, and a 36+ 0 neutral polyethylene liner for that size acetabular component.  Attention was then turned to the femur. With the leg externally rotated to 100 degrees, extended and adducted, we placed a Mueller retractor medially and Hohmann retractor behind the greater trochanter.  I used a box cutting osteotome to enter femoral canal and a rongeur to lateralize and then began broaching from a size 8 broach up to a size 11, this actually corresponded with her femoral component on her opposite side.  With the size 11, we trialed a standard neck and a 36+ 1.5 hip ball and reduced this in the acetabulum.  We were pleased with range of motion, stability, offset, and leg length.  She was quite tight.  We then dislocated the hip and removed the trial components.  We placed the real size 11 Corail femoral component with standard offset and the real 36+ 1.5 ceramic hip ball and reduced this into the acetabulum.  We were pleased again with stability.  We then irrigated the soft tissue with normal saline solution using pulsatile lavage, closed the joint capsule with interrupted #1 Ethibond suture followed by running #1 Vicryl in the tensor fascia, 0 Vicryl in the deep tissue, 2-0 Vicryl in subcutaneous tissue, 4-0 Monocryl subcuticular stitch, and Steri-Strips on the skin, and an Aquacel dressing was applied.  She was then taken off the Hana table, awakened, extubated, taken to the recovery room in stable condition.  All final counts were correct.  There were no complications noted.  Of note, Teresa Emery, PA- C assisted in the entire case.  His assistance was crucial for facilitating all aspects of this case.     Lind Guest. Ninfa Linden,  M.D.     CYB/MEDQ  D:  11/21/2014  T:  11/21/2014  Job:  [id number]

## 2014-11-22 NOTE — Progress Notes (Signed)
Physical Therapy Treatment Patient Details Name: Teresa French MRN: [id number] DOB: Jul 15, 1940 Today's Date: 11/22/2014    History of Present Illness [name]    PT Comments    Remains lethargic, R hip and knee are tight in flexed position, very painful to attempt to extend the hip and knee.  RN aware of lethargy.  Follow Up Recommendations  SNF;Supervision/Assistance - 24 hour     Equipment Recommendations  Rolling walker with 5" wheels    Recommendations for Other Services       Precautions / Restrictions Precautions Precautions: Fall Precaution Comments: pt very drowsy today    Mobility  Bed Mobility   Bed Mobility: Supine to Sit     Supine to sit: Total assist;+2 for physical assistance;+2 for safety/equipment;HOB elevated Sit to supine: Total assist;+2 for physical assistance;+2 for safety/equipment   General bed mobility comments: assist wuith trunk and legs, bed pad used to turn around and get to bed edge and back into bed.  Transfers Overall transfer level: Needs assistance Equipment used: Rolling walker (2 wheeled) Transfers: Sit to/from Stand Sit to Stand: Max assist;+2 physical assistance;+2 safety/equipment Stand pivot transfers: Max assist;+2 physical assistance;+2 safety/equipment       General transfer comment: multimodal cues for technique, very slow to respond, stands with knees flexed, poorly able to take steps for pivot to recliner this session. Assisted to [facility] and back to bed.  Ambulation/Gait                 Stairs            Wheelchair Mobility    Modified Rankin (Stroke Patients Only)       Balance                                    Cognition Arousal/Alertness: Lethargic;Suspect due to medications Behavior During Therapy: Flat affect Overall Cognitive Status: Difficult to assess                      Exercises      General Comments        Pertinent Vitals/Pain Pain Assessment:  Faces Faces Pain Scale: Hurts even more Pain Location: R thigh Pain Descriptors / Indicators: Grimacing;Discomfort;Crying Pain Intervention(s): Limited activity within patient's tolerance;Monitored during session;Premedicated before session;Repositioned;Ice applied    Home Living                      Prior Function            PT Goals (current goals can now be found in the care plan section) Progress towards PT goals: Not progressing toward goals - comment    Frequency  7X/week    PT Plan Current plan remains appropriate    Co-evaluation             End of Session Equipment Utilized During Treatment: Gait belt Activity Tolerance: Patient limited by lethargy;Patient limited by pain Patient left: in bed;with call bell/phone within reach;with bed alarm set     Time: 5573-2202 PT Time Calculation (min) (ACUTE ONLY): 30 min  Charges:  $Therapeutic Activity: 23-37 mins                    G Codes:      Claretha Cooper 11/22/2014, 5:28 PM

## 2014-11-22 NOTE — Progress Notes (Signed)
OT Cancellation Note  Patient Details Name: Teresa French MRN: [id number] DOB: 07-Jul-1940   Cancelled Treatment:    Reason Eval/Treat Not Completed: Fatigue/lethargy limiting ability to participate Spoke with PT who felt pt not ready for OT this day.  Carlyon Shadow, [place] 11/22/2014, 9:33 AM

## 2014-11-22 NOTE — Progress Notes (Signed)
Physical Therapy Treatment Patient Details Name: Teresa French MRN: [id number] DOB: [birth year] Today's Date: 11/22/2014    History of Present Illness [name]    PT Comments    Remains too lethargic to participate. Appears to have lots of pain of R thigh.  Follow Up Recommendations  SNF;Supervision/Assistance - 24 hour     Equipment Recommendations  Rolling walker with 5" wheels    Recommendations for Other Services       Precautions / Restrictions Precautions Precautions: Fall Precaution Comments: pt very drowsy today    Mobility  Bed Mobility   Bed Mobility: Sit to Supine       Sit to supine: Total assist;+2 for physical assistance;+2 for safety/equipment   General bed mobility comments: assist wuith trunk and legs, bed pad used to turn around and get into bed, pt. did not assist.  Transfers Overall transfer level: Needs assistance Equipment used: Rolling walker (2 wheeled) Transfers: Sit to/from Stand Sit to Stand: Max assist;+2 safety/equipment;+2 physical assistance;From elevated surface Stand pivot transfers: +2 physical assistance;+2 safety/equipment;Mod assist       General transfer comment: multimodal cues for hand and R leg position, hand over hand cues to reach back, cues for sequence to take small steps  to turn and back up to bed..  Ambulation/Gait                 Stairs            Wheelchair Mobility    Modified Rankin (Stroke Patients Only)       Balance                                    Cognition Arousal/Alertness: Lethargic;Suspect due to medications Behavior During Therapy: Flat affect Overall Cognitive Status: Difficult to assess                      Exercises      General Comments        Pertinent Vitals/Pain Pain Assessment: Faces Faces Pain Scale: Hurts even more Pain Location: R thigh Pain Descriptors / Indicators: Grimacing;Discomfort;Crying Pain Intervention(s): Limited  activity within patient's tolerance;Monitored during session;Premedicated before session;Repositioned;Ice applied    Home Living                      Prior Function            PT Goals (current goals can now be found in the care plan section) Progress towards PT goals: Not progressing toward goals - comment (too lethargic)    Frequency  7X/week    PT Plan Current plan remains appropriate    Co-evaluation             End of Session Equipment Utilized During Treatment: Gait belt Activity Tolerance: Patient limited by lethargy;Patient limited by pain Patient left: with family/visitor present     Time: 1325-1350 PT Time Calculation (min) (ACUTE ONLY): 25 min  Charges:  $Therapeutic Activity: 23-37 mins                    G Codes:      Teresa French 11/22/2014, 5:25 PM

## 2014-11-22 NOTE — Discharge Instructions (Signed)

## 2014-11-22 NOTE — Evaluation (Signed)
Physical Therapy Evaluation Patient Details Name: Teresa French MRN: [id number] DOB: 04/25/1940 Today's Date: 11/22/2014   History of Present Illness  [name]  Clinical Impression  Patient is VERY lethargic, 2 persons to get OOB and aroused enough to  Take a few steps to recliner. Patient French benefit from PT to address problems listed in note below.    Follow Up Recommendations SNF;Supervision/Assistance - 24 hour (unless improves enough and has  24/7 caregivers.)    Equipment Recommendations  Rolling walker with 5" wheels    Recommendations for Other Services       Precautions / Restrictions Precautions Precautions: Fall Precaution Comments: pt very drowsy today Restrictions Weight Bearing Restrictions: No      Mobility  Bed Mobility Overal bed mobility: Needs Assistance;+ 2 for safety/equipment;+2 for physical assistance Bed Mobility: Supine to Sit     Supine to sit: Total assist;+2 for physical assistance;+2 for safety/equipment;HOB elevated     General bed mobility comments: bed pad used to slide patient  around and legs over, assist  for trunk to upright.  Transfers Overall transfer level: Needs assistance Equipment used: Rolling walker (2 wheeled) Transfers: Sit to/from [facility] Sit to Stand: Max assist;+2 safety/equipment;+2 physical assistance;From elevated surface Stand pivot transfers: +2 physical assistance;+2 safety/equipment;Mod assist       General transfer comment: multimodal cues for hand and R leg position, hand over hand cues to reach back, cues for sequence to take small steps  to turn and back up to recliner.  Ambulation/Gait             General Gait Details: only small steps to recliner due to lethargy.  Stairs            Wheelchair Mobility    Modified Rankin (Stroke Patients Only)       Balance Overall balance assessment: Needs assistance Sitting-balance support: Feet supported;Bilateral upper  extremity supported Sitting balance-Leahy Scale: Poor     Standing balance support: Bilateral upper extremity supported;During functional activity Standing balance-Leahy Scale: Poor Standing balance comment: due to lethargy                             Pertinent Vitals/Pain Pain Assessment: Faces Faces Pain Scale: Hurts even more Pain Location: R hip Pain Descriptors / Indicators: Grimacing;Guarding Pain Intervention(s): Limited activity within patient's tolerance;Monitored during session;Premedicated before session;Ice applied    Home Living Family/patient expects to be discharged to:: Private residence Living Arrangements: Children Available Help at Discharge: Family Type of Home: House Home Access: Stairs to enter Entrance Stairs-Rails: Right;Left;Can reach both Entrance Stairs-Number of Steps: 2 Home Layout: Two level;Able to live on main level with bedroom/bathroom Home Equipment: None Additional Comments: patient is so drowsy, unsure of reliable information    Prior Function Level of Independence: Independent               Hand Dominance        Extremity/Trunk Assessment               Lower Extremity Assessment: RLE deficits/detail RLE Deficits / Details: bares about 50% weight on the leg for transfer, did advance small steps    Cervical / Trunk Assessment: Normal  Communication   Communication:  (is so drowsy, barely audible speech)  Cognition Arousal/Alertness: Lethargic;Suspect due to medications Behavior During Therapy: Flat affect Overall Cognitive Status: Difficult to assess  General Comments      Exercises        Assessment/Plan    PT Assessment Patient needs continued PT services  PT Diagnosis Difficulty walking;Acute pain;Altered mental status   PT Problem List Decreased strength;Decreased activity tolerance;Decreased range of motion;Decreased balance;Decreased mobility;Decreased  cognition;Decreased knowledge of use of DME;Decreased safety awareness;Decreased knowledge of precautions;Pain  PT Treatment Interventions DME instruction;Gait training;Stair training;Functional mobility training;Therapeutic activities;Patient/family education   PT Goals (Current goals can be found in the Care Plan section) Acute Rehab PT Goals Patient Stated Goal: agreed to get OOB PT Goal Formulation: With patient Time For Goal Achievement: 11/29/14 Potential to Achieve Goals: Good    Frequency 7X/week   Barriers to discharge   uncertain of caregivers at home, states "daughter", no family present.    Co-evaluation               End of Session Equipment Utilized During Treatment: Gait belt Activity Tolerance: Patient limited by lethargy Patient left: in chair;with call bell/phone within reach;with chair alarm set;with nursing/sitter in room Nurse Communication: Mobility status         Time: 2924-4628 PT Time Calculation (min) (ACUTE ONLY): 23 min   Charges:   PT Evaluation $Initial PT Evaluation Tier I: 1 Procedure PT Treatments $Therapeutic Activity: 8-22 mins   PT G Codes:        Claretha Cooper 11/22/2014, 10:04 AM

## 2014-11-23 LAB — CBC
HCT: 32.3 % — ABNORMAL LOW (ref 36.0–46.0)
Hemoglobin: 10.6 g/dL — ABNORMAL LOW (ref 12.0–15.0)
MCH: 28.2 pg (ref 26.0–34.0)
MCHC: 32.8 g/dL (ref 30.0–36.0)
MCV: 85.9 fL (ref 78.0–100.0)
Platelets: 155 10*3/uL (ref 150–400)
RBC: 3.76 MIL/uL — ABNORMAL LOW (ref 3.87–5.11)
RDW: 14.2 % (ref 11.5–15.5)
WBC: 9.6 10*3/uL (ref 4.0–10.5)

## 2014-11-23 MED ORDER — POLYETHYLENE GLYCOL 3350 17 G PO PACK
17.0000 g | PACK | Freq: Every day | ORAL | Status: DC | PRN
  Administered 2014-11-23: 17 g via ORAL
  Filled 2014-11-23: qty 1

## 2014-11-23 NOTE — Progress Notes (Signed)
Utilization Review Completed.Fany Cavanaugh T9/06/2014  

## 2014-11-23 NOTE — Progress Notes (Signed)
Patient ID: Teresa French, female   DOB: 1940-06-27, 74 y.o.   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 Patient is progressing well anticipate discharge to home on Monday.

## 2014-11-23 NOTE — Progress Notes (Signed)
MD notified about patient's increasing temperature, unrelieved by tylenol.   Sharol Given, MD informed about last 3 temperatures, 101.8, 101.5, and 102.3. MD notified about lung sounds, as well as urinary frequency over night.  MD also informed that staff have been helping patient move around, as well as encouraging, and educating patient about incentive spirometer (IS), and coughing and deep breathing.  MD said to encourage patient moving around, and IS. No further orders given.

## 2014-11-23 NOTE — Evaluation (Signed)
Occupational Therapy Evaluation Patient Details Name: Teresa French MRN: [id number] DOB: 1940-05-22 Today's Date: 11/23/2014    History of Present Illness [name]   Clinical Impression   This 74 year old female was admitted for the above surgery. She will benefit from skilled OT to increase safety and independence with adls.  Pt currently needs max to total A for LB adls and up to mod A to stand.  Goals in acute are for min A level    Follow Up Recommendations  SNF (unless pt has 24/7 assistance)    Equipment Recommendations  3 in 1 bedside comode    Recommendations for Other Services       Precautions / Restrictions Precautions Precautions: Fall Restrictions Weight Bearing Restrictions: No      Mobility Bed Mobility         Supine to sit: Mod assist;+2 for physical assistance;HOB elevated     General bed mobility comments: assist for LEs and trunk; utilized bed pad to scoot forward in bed  Transfers   Equipment used: Rolling walker (2 wheeled) Transfers: Sit to/from Stand Sit to Stand: Min assist;Mod assist Stand pivot transfers: Min assist       General transfer comment: Mod A to stand from bed; min from 3:1; multimodal cues to stand up tall; cues for sequence and to turn RLE forward as she tended to externally rotate    Balance                                            ADL Overall ADL's : Needs assistance/impaired     Grooming: Wash/dry hands;Set up;Sitting   Upper Body Bathing: Supervision/ safety;Sitting   Lower Body Bathing: Maximal assistance;Sit to/from stand   Upper Body Dressing : Set up;Sitting   Lower Body Dressing: Maximal assistance;Sit to/from stand   Toilet Transfer: Minimal assistance;Moderate assistance;BSC;RW   Toileting- Clothing Manipulation and Hygiene: Total assistance;Sit to/from stand         General ADL Comments: SPT to [facility] then took a few steps to recliner.  Mod A to stand from elevated bed and  min A from 3:1 commode     Vision     Perception     Praxis      Pertinent Vitals/Pain Pain Assessment: 0-10 Pain Score: 5  Pain Location: R thigh Pain Descriptors / Indicators: Aching Pain Intervention(s): Limited activity within patient's tolerance;Monitored during session;Premedicated before session;Repositioned     Hand Dominance     Extremity/Trunk Assessment Upper Extremity Assessment Upper Extremity Assessment: Generalized weakness           Communication Communication Communication: No difficulties (very soft spoken)   Cognition Arousal/Alertness: Awake/alert Behavior During Therapy: WFL for tasks assessed/performed Overall Cognitive Status: No family/caregiver present to determine baseline cognitive functioning (processing slow initially; improved)                     General Comments       Exercises       Shoulder Instructions      Home Living Family/patient expects to be discharged to:: Unsure                       Bathroom Toilet: Standard     Home Equipment: None          Prior Functioning/Environment Level of Independence: Independent  OT Diagnosis: Generalized weakness   OT Problem List: Decreased strength;Decreased activity tolerance;Decreased knowledge of use of DME or AE;Pain   OT Treatment/Interventions: Self-care/ADL training;DME and/or AE instruction;Patient/family education    OT Goals(Current goals can be found in the care plan section) Acute Rehab OT Goals Patient Stated Goal: none stated OT Goal Formulation: With patient Time For Goal Achievement: 11/30/14 Potential to Achieve Goals: Good ADL Goals Pt Will Perform Lower Body Bathing: with min assist;sit to/from stand Pt Will Perform Lower Body Dressing: with adaptive equipment;sit to/from stand;with min assist (pants) Pt Will Transfer to Toilet: with min assist;ambulating;bedside commode Pt Will Perform Toileting - Clothing Manipulation  and hygiene: with min assist;sit to/from stand  OT Frequency: Min 2X/week   Barriers to D/C:            Co-evaluation              End of Session    Activity Tolerance: Patient tolerated treatment well Patient left: in chair;with call bell/phone within reach;with chair alarm set   Time: 1761-6073 OT Time Calculation (min): 22 min Charges:  OT General Charges $OT Visit: 1 Procedure OT Evaluation $Initial OT Evaluation Tier I: 1 Procedure G-Codes:    Brettany Sydney 2014/12/01, 9:16 AM  Lesle Chris, OTR/L [phone] 2014/12/01

## 2014-11-23 NOTE — Care Management Note (Signed)
Case Management Note  Patient Details  Name: Teresa French MRN: [id number] Date of Birth: 06/03/1940  Subjective/Objective:       right total hip arthroplasty             Action/Plan: Home Health preoperatively arranged with [facility]. Weekday NCM will follow up on 11/24/2014 for DME for home.   Expected Discharge Date:  11/24/2014               Expected Discharge Plan:  [place]  In-House Referral:     Discharge planning Services  CM Consult  Post Acute Care Choice:  Home Health Choice offered to:      [facility] Arranged:  PT HH Agency:  [facility]  Status of Service:     Medicare Important Message Given:    Date Medicare IM Given:    Medicare IM give by:    Date Additional Medicare IM Given:    Additional Medicare Important Message give by:     If discussed at [place] of Stay Meetings, dates discussed:    Additional Comments:  Erenest Rasher, RN 11/23/2014, 6:36 PM

## 2014-11-23 NOTE — Clinical Social Work Note (Signed)
If pt decides to go home with PT services she will be going to her daughter's house in [place].  The address is: [address] [place], [place] [address].  She stated that it is [employer].  Dede Query, LCSW [place] Worker - Weekend Coverage cell #: [phone]

## 2014-11-23 NOTE — Progress Notes (Signed)
Physical Therapy Treatment Patient Details Name: Teresa French MRN: [id number] DOB: 1940-11-20 Today's Date: 11/23/2014    History of Present Illness [name]    PT Comments    POD # 2 am session.  Pt OOB in recliner.  Required + 2 assist to stand.  Amb limited distance with much effort to advance gait distance.  Performed TE's while in recliner followed by ICE.   Follow Up Recommendations  SNF     Equipment Recommendations       Recommendations for Other Services       Precautions / Restrictions Precautions Precautions: Fall Restrictions Weight Bearing Restrictions: No    Mobility  Bed Mobility               General bed mobility comments: Pt OOB in recliner  Transfers Overall transfer level: Needs assistance Equipment used: Rolling walker (2 wheeled) Transfers: Sit to/from Stand Sit to Stand: Min assist;Mod assist;+2 safety/equipment         General transfer comment: Mod assist to raise with 75% VC's on proper hand placement and to increase self push up.  Hand over hand placement with stand to sit to assist to control decend.  slow moving  Ambulation/Gait Ambulation/Gait assistance: Min assist;+2 safety/equipment Ambulation Distance (Feet): 8 Feet Assistive device: Rolling walker (2 wheeled) Gait Pattern/deviations: Step-to pattern;Decreased stance time - right Gait velocity: decreased   General Gait Details: required + 2 assist and assist to advance R LE.  very slow gait.     Stairs            Wheelchair Mobility    Modified Rankin (Stroke Patients Only)       Balance                                    Cognition Arousal/Alertness: Awake/alert Behavior During Therapy: Flat affect                        Exercises   Total Hip Replacement TE's 10 reps ankle pumps 10 reps knee presses 10 reps heel slides 10 reps SAQ's 10 reps ABD Followed by ICE     General Comments        Pertinent Vitals/Pain Pain  Assessment: 0-10 Pain Score: 5  Pain Location: R hip Pain Descriptors / Indicators: Tender;Sore Pain Intervention(s): Monitored during session;Repositioned;Ice applied    Home Living                      Prior Function            PT Goals (current goals can now be found in the care plan section) Progress towards PT goals: Progressing toward goals    Frequency  7X/week    PT Plan Current plan remains appropriate    Co-evaluation             End of Session Equipment Utilized During Treatment: Gait belt Activity Tolerance: Patient limited by fatigue;No increased pain Patient left: with call bell/phone within reach     Time: 0945-1010 PT Time Calculation (min) (ACUTE ONLY): 25 min  Charges:  $Gait Training: 8-22 mins $Therapeutic Exercise: 8-22 mins                    G Codes:      Rica Koyanagi  PTA [facility]  Acute  Rehab Pager  [phone]  

## 2014-11-23 NOTE — Clinical Social Work Note (Signed)
Clinical Social Work Assessment  Patient Details  Name: Teresa French MRN: [id number] Date of Birth: May 16, 1940  Date of referral:  11/23/14               Reason for consult:  Facility Placement                Permission sought to share information with:  Chartered certified accountant granted to share information::  Yes, Verbal Permission Granted  Name::        Agency::     Relationship::     Contact Information:     Housing/Transportation Living arrangements for the past 2 months:  Single Family Home Source of Information:  Adult Children (daughter michelle) Patient Interpreter Needed:  None Criminal Activity/Legal Involvement Pertinent to Current Situation/Hospitalization:    Significant Relationships:  Adult Children Lives with:  Self Do you feel safe going back to the place where you live?    Need for family participation in patient care:  Yes (Comment)  Care giving concerns:  [name] is concerned with pt's pain medications causing her to sleep and not be able to work with PT   Social Worker assessment / plan:  CSW met with pt at bedside but she could not keep her eyes open to talk.  Pt did say yes to allowing CSW to call her daughter.  CSW called and spoke with pt's daughter who is a former NP.  CSW provided explanation of role and SNF protocol.  CSW encouraged pt's daughter to explore thoughts and feelings related to pt rehab.  CSW prompted pt's daughter to discuss pt history and needs. CSW will sent pt information to SNF's in [place] as pt's daughter lives in [place] and wants pt close if she goes to SNF  Employment status:  Retired Forensic scientist:  Managed Care PT Recommendations:  [address] [place] / Referral to community resources:     Patient/Family's Response to care:  Pt could not remain awake.  [name] discussed pt living alone in [place].  She discussed wanting pt to come to her home in [place] at discharge  if possible and have in home PT services.  Pt has not been able to stay awake while on her pain meds so pt's daughter asked MD to change them yesterday hoping pt will be able to participate in therapy more.  [name] ok to fax pt out in [place] because she lives in [place] and wants to be close.  [name] stated that if possible she will take pt home with PT services  Patient/Family's Understanding of and Emotional Response to Diagnosis, Current Treatment, and Prognosis:  Pt asleep.  [name] understands pt's diagnosis and needs. Stated that pt has a history with Dr. Ninfa Linden   Emotional Assessment Appearance:  Appears stated age Attitude/Demeanor/Rapport:  Sedated Affect (typically observed):  Unable to Assess (pt could not stay awakee) Orientation:  Oriented to Self, Oriented to Place, Oriented to  Time, Oriented to Situation Alcohol / Substance use:    Psych involvement (Current and /or in the community):     Discharge Needs  Concerns to be addressed:    Readmission within the last 30 days:    Current discharge risk:    Barriers to Discharge:  No Barriers Identified   Carlean Jews, LCSW 11/23/2014, 9:20 AM

## 2014-11-24 LAB — URINALYSIS, ROUTINE W REFLEX MICROSCOPIC
Bilirubin Urine: NEGATIVE
Glucose, UA: NEGATIVE mg/dL
Hgb urine dipstick: NEGATIVE
KETONES UR: NEGATIVE mg/dL
LEUKOCYTES UA: NEGATIVE
NITRITE: NEGATIVE
PH: 6.5 (ref 5.0–8.0)
Protein, ur: 30 mg/dL — AB
SPECIFIC GRAVITY, URINE: 1.021 (ref 1.005–1.030)
Urobilinogen, UA: 0.2 mg/dL (ref 0.0–1.0)

## 2014-11-24 LAB — CBC
HEMATOCRIT: 30.1 % — AB (ref 36.0–46.0)
HEMOGLOBIN: 10.2 g/dL — AB (ref 12.0–15.0)
MCH: 28.9 pg (ref 26.0–34.0)
MCHC: 33.9 g/dL (ref 30.0–36.0)
MCV: 85.3 fL (ref 78.0–100.0)
PLATELETS: 146 10*3/uL — AB (ref 150–400)
RBC: 3.53 MIL/uL — AB (ref 3.87–5.11)
RDW: 14.4 % (ref 11.5–15.5)
WBC: 9.3 10*3/uL (ref 4.0–10.5)

## 2014-11-24 LAB — URINE MICROSCOPIC-ADD ON

## 2014-11-24 MED ORDER — TRAMADOL HCL 50 MG PO TABS: 50.0000 mg | ORAL_TABLET | Freq: Four times a day (QID) | ORAL | Status: DC | PRN

## 2014-11-24 MED ORDER — ASPIRIN 325 MG PO TBEC: 325.0000 mg | DELAYED_RELEASE_TABLET | Freq: Two times a day (BID) | ORAL | Status: DC

## 2014-11-24 MED ORDER — TIZANIDINE HCL 4 MG PO TABS: 4.0000 mg | ORAL_TABLET | Freq: Four times a day (QID) | ORAL | Status: DC | PRN

## 2014-11-24 NOTE — Progress Notes (Addendum)
CSW continuing to follow.   CSW followed up with pt and pt daughter, [name] at bedside regarding disposition plan.   Per MD note, this morning pt declining SNF and wanted to return home, but needed to be confirmed with pt daughter.   CSW introduced self to pt and pt daughter upon arrival. CSW inquired about disposition plan. Pt daughter stated that she was hopeful that PT would return to pt room for afternoon treatment in order to make a decision regarding disposition.  CSW notified RN who paged PT and PT came to provide treatment to pt. After PT treatment, pt and pt daughter, [name] are agreeable to rehab at [facility] for one week. CSW discussed the SNF bed offers with pt and pt daughter. Pt and pt daughter choose bed at [facility] as long as facility has private room available.  CSW contacted [facility] and confirmed private room availability for tomorrow.   CSW notified pt and pt daughter at bedside and both are in agreement to plan for [facility] tomorrow.  CSW to continue to follow to provide support and assist with pt disposition needs.   Alison Murray, MSW, LCSW Clinical Social Work Coverage for [employer], [place]

## 2014-11-24 NOTE — Progress Notes (Signed)
Occupational Therapy Treatment Patient Details Name: Teresa French MRN: [id number] DOB: Nov 07, 1940 Today's Date: 11/24/2014    History of present illness [name]   OT comments  Pt needed less A this OT visit.  Will meet with daughter later in the day when she arrives as DC plan now home  Follow Up Recommendations  Home health OT;Supervision/Assistance - 24 hour    Equipment Recommendations  3 in 1 bedside comode    Recommendations for Other Services      Precautions / Restrictions Precautions Precautions: Fall Restrictions Weight Bearing Restrictions: No       Mobility Bed Mobility Overal bed mobility: Needs Assistance Bed Mobility: Supine to Sit     Supine to sit: Mod assist        Transfers Overall transfer level: Needs assistance Equipment used: Rolling walker (2 wheeled) Transfers: Sit to/from Stand Sit to Stand: Min assist Stand pivot transfers: Min assist            Balance                                   ADL                           Toilet Transfer: Minimal assistance;BSC;Comfort height toilet;Ambulation;RW;Cueing for safety;Cueing for sequencing   Toileting- Clothing Manipulation and Hygiene: Sit to/from stand;Moderate assistance;Cueing for safety;Cueing for sequencing                Vision                     Perception     Praxis      Cognition   Behavior During Therapy: Flat affect                         Extremity/Trunk Assessment               Exercises     Shoulder Instructions       General Comments      Pertinent Vitals/ Pain       Pain Score: 4  Pain Location: r hip Pain Descriptors / Indicators: Sore Pain Intervention(s): Limited activity within patient's tolerance;Monitored during session  Home Living                                          Prior Functioning/Environment              Frequency Min 2X/week     Progress Toward  Goals  OT Goals(current goals can now be found in the care plan section)  Progress towards OT goals: Progressing toward goals     Plan Discharge plan needs to be updated    Co-evaluation                 End of Session     Activity Tolerance Patient tolerated treatment well   Patient Left in chair   Nurse Communication Mobility status        Time: 1010-1024 OT Time Calculation (min): 14 min  Charges: OT General Charges $OT Visit: 1 Procedure OT Treatments $Self Care/Home Management : 8-22 mins  Reginae Wolfrey D 11/24/2014, 10:38 AM

## 2014-11-24 NOTE — Progress Notes (Signed)
Patient ID: Teresa French, female   DOB: 1940/07/19, 74 y.o.   MRN: [id number] Patient would like to go home instead of SNF.  Will have to make sure this is ok with her daughter.  If the plan is home, will wait another day until discharge for extra therapy prior to home.  Plan to discharge tomorrow.  U/A negative for UTI.

## 2014-11-24 NOTE — Progress Notes (Signed)
Physical Therapy Treatment Patient Details Name: Teresa French MRN: [id number] DOB: [birth year] Today's Date: 11/24/2014    History of Present Illness [name]    PT Comments    POD # 3 am session.  Pt progressing slowly. Assisted out of recliner to amb a greater distance.  Very slow gait but steady.  Performed THR TE's followed by ICE  Pt declines SNF rec and plans to D/C to her daughter's house [name]).  Pt stated she has no steps to enter and has a walker.    Follow Up Recommendations  Home health PT (pt declining SNF rec and plans to go to her daughter's house with 24/7 family assist)     Equipment Recommendations  Rolling walker with 5" wheels (pt stated she has one but will need to confirm with family)    Recommendations for Other Services       Precautions / Restrictions Precautions Precautions: Fall Restrictions Weight Bearing Restrictions: No    Mobility  Bed Mobility Overal bed mobility: Needs Assistance Bed Mobility: Supine to Sit     Supine to sit: Mod assist     General bed mobility comments: Pt OOB in recliner  Transfers Overall transfer level: Needs assistance Equipment used: Rolling walker (2 wheeled) Transfers: Sit to/from Stand Sit to Stand: Min assist Stand pivot transfers: Min assist       General transfer comment: Mod assist to raise with 75% VC's on proper hand placement and to increase self push up.  Hand over hand placement with stand to sit to assist to control decend.  slow moving  Ambulation/Gait Ambulation/Gait assistance: Min assist Ambulation Distance (Feet): 25 Feet Assistive device: Rolling walker (2 wheeled) Gait Pattern/deviations: Step-to pattern;Decreased stance time - right;Trunk flexed     General Gait Details: increased time.  Tolerated increased distance.     Stairs            Wheelchair Mobility    Modified Rankin (Stroke Patients Only)       Balance                                     Cognition Arousal/Alertness: Awake/alert Behavior During Therapy: Flat affect                        Exercises   Total Hip Replacement TE's 10 reps ankle pumps 10 reps knee presses 10 reps heel slides 10 reps SAQ's 10 reps ABD Followed by ICE     General Comments        Pertinent Vitals/Pain Pain Assessment: 0-10 Pain Score: 5  Pain Location: R hip Pain Descriptors / Indicators: Sore;Tightness Pain Intervention(s): Monitored during session;Premedicated before session;Repositioned;Ice applied    Home Living                      Prior Function            PT Goals (current goals can now be found in the care plan section) Progress towards PT goals: Progressing toward goals    Frequency  7X/week    PT Plan      Co-evaluation             End of Session Equipment Utilized During Treatment: Gait belt Activity Tolerance: Patient tolerated treatment well Patient left: in chair     Time: 1045-1110 PT Time Calculation (min) (ACUTE ONLY): 25 min  Charges:  $Gait Training: 8-22 mins $Therapeutic Activity: 8-22 mins                    G Codes:      Rica Koyanagi  PTA [facility]  Acute  Rehab Pager      [phone]

## 2014-11-24 NOTE — Clinical Social Work Placement (Signed)
   CLINICAL SOCIAL WORK PLACEMENT  NOTE  Date:  11/24/2014  Patient Details  Name: Teresa French MRN: [id number] Date of Birth: Jul 19, 1940  Clinical Social Work is seeking post-discharge placement for this patient at the [place] level of care (*CSW will initial, date and re-position this form in  chart as items are completed):  Yes   Patient/family provided with [place] Work Department's list of facilities offering this level of care within the geographic area requested by the patient (or if unable, by the patient's family).  Yes   Patient/family informed of their freedom to choose among providers that offer the needed level of care, that participate in Medicare, Medicaid or managed care program needed by the patient, have an available bed and are willing to accept the patient.  Yes   Patient/family informed of [facility]'s ownership interest in [facility] and [facility], as well as of the fact that they are under no obligation to receive care at these facilities.  PASRR submitted to EDS on 11/23/14     PASRR number received on 11/23/14     Existing PASRR number confirmed on       FL2 transmitted to all facilities in geographic area requested by pt/family on 11/23/14     FL2 transmitted to all facilities within larger geographic area on       Patient informed that his/her managed care company has contracts with or will negotiate with certain facilities, including the following:        Yes   Patient/family informed of bed offers received.  Patient chooses bed at [facility]     Physician recommends and patient chooses bed at      Patient to be transferred to   on  .  Patient to be transferred to facility by       Patient family notified on   of transfer.  Name of family member notified:        PHYSICIAN Please sign FL2     Additional Comment:    _______________________________________________ Ladell Pier,  LCSW 11/24/2014, 3:24 PM

## 2014-11-24 NOTE — Progress Notes (Signed)
Physical Therapy Treatment Patient Details Name: Teresa French MRN: [id number] DOB: 06-27-1940 Today's Date: 12/09/2014    History of Present Illness [name]    PT Comments    Spoke with dtr regarding pt progress, D/C plan etc; dtr very concerned about her mother's progress, incr lethargy and that she does not have 24hr assist at home; feel pt would benefit from  SNF post acute, SW present and discussed with dtr and pt, both agreeable at this time; will see in am  Follow Up Recommendations  Supervision/Assistance - 24 hour;SNF     Equipment Recommendations  Rolling walker with 5" wheels    Recommendations for Other Services       Precautions / Restrictions Precautions Precautions: Fall Restrictions Weight Bearing Restrictions: No    Mobility  Bed Mobility               General bed mobility comments: Pt OOB in recliner  Transfers Overall transfer level: Needs assistance Equipment used: Rolling walker (2 wheeled) Transfers: Sit to/from Stand Sit to Stand: Min assist         General transfer comment: multimodal cues for hand placement adn safe technique  Ambulation/Gait Ambulation/Gait assistance: Min assist Ambulation Distance (Feet): 28 Feet Assistive device: Rolling walker (2 wheeled) Gait Pattern/deviations: Step-to pattern;Antalgic Gait velocity: decreased   General Gait Details: assist for RW direction, intermittent assist to advance RLE d/t incr pain with activity, cues for sequence adn safety   Stairs            Wheelchair Mobility    Modified Rankin (Stroke Patients Only)       Balance                                    Cognition Arousal/Alertness: Awake/alert Behavior During Therapy: Flat affect Overall Cognitive Status: Within Functional Limits for tasks assessed                      Exercises Total Joint Exercises Ankle Circles/Pumps: AROM;Both;10 reps    General Comments        Pertinent  Vitals/Pain Pain Assessment: 0-10 Pain Score: 8  Pain Location: r hip Pain Descriptors / Indicators: Cramping;Tightness;Sore Pain Intervention(s): Limited activity within patient's tolerance;Monitored during session;Repositioned;Ice applied    Home Living                      Prior Function            PT Goals (current goals can now be found in the care plan section) Acute Rehab PT Goals Patient Stated Goal: none stated PT Goal Formulation: With patient Time For Goal Achievement: 11/29/14 Potential to Achieve Goals: Good Progress towards PT goals: Progressing toward goals    Frequency  7X/week    PT Plan Discharge plan needs to be updated    Co-evaluation             End of Session Equipment Utilized During Treatment: Gait belt Activity Tolerance: Patient tolerated treatment well Patient left: in chair;with call bell/phone within reach;with family/visitor present;with chair alarm set     Time: 1829-9371 PT Time Calculation (min) (ACUTE ONLY): 26 min  Charges:  $Gait Training: 8-22 mins $Therapeutic Activity: 8-22 mins                    G Codes:      Carri Spillers 12/09/14, 4:53  PM   

## 2014-11-25 ENCOUNTER — Encounter (HOSPITAL_COMMUNITY): Payer: Self-pay | Admitting: Orthopaedic Surgery

## 2014-11-25 MED ORDER — BISACODYL 10 MG RE SUPP
10.0000 mg | Freq: Once | RECTAL | Status: AC
  Administered 2014-11-25: 10 mg via RECTAL
  Filled 2014-11-25: qty 1

## 2014-11-25 NOTE — Clinical Social Work Placement (Signed)
   CLINICAL SOCIAL WORK PLACEMENT  NOTE  Date:  11/25/2014  Patient Details  Name: Teresa French MRN: [id number] Date of Birth: [birth year]  Clinical Social Work is seeking post-discharge placement for this patient at the [place] level of care (*CSW will initial, date and re-position this form in  chart as items are completed):  Yes   Patient/family provided with [place] Work Department's list of facilities offering this level of care within the geographic area requested by the patient (or if unable, by the patient's family).  Yes   Patient/family informed of their freedom to choose among providers that offer the needed level of care, that participate in Medicare, Medicaid or managed care program needed by the patient, have an available bed and are willing to accept the patient.  Yes   Patient/family informed of [place] [facility]'s ownership interest in [facility] and [facility], as well as of the fact that they are under no obligation to receive care at these facilities.  PASRR submitted to EDS on 11/23/14     PASRR number received on 11/23/14     Existing PASRR number confirmed on       FL2 transmitted to all facilities in geographic area requested by pt/family on 11/23/14     FL2 transmitted to all facilities within larger geographic area on       Patient informed that his/her managed care company has contracts with or will negotiate with certain facilities, including the following:        Yes   Patient/family informed of bed offers received.  Patient chooses bed at [facility]     Physician recommends and patient chooses bed at      Patient to be transferred to [facility] on 11/25/14.  Patient to be transferred to facility by family     Patient family notified on 11/25/14 of transfer.  Name of family member notified:  dtr     PHYSICIAN Please sign FL2     Additional Comment:     _______________________________________________ Boone Master, [place] 11/25/2014, 1:30 PM

## 2014-11-25 NOTE — Progress Notes (Signed)
Clinical Social Work  CSW faxed DC summary to [facility] who completed paperwork and is agreeable to accept. CSW prepared DC packet with FL2, DC summary and hard scripts included. RN to call report. Patient and dtr aware and agreeable to DC. Dtr to provide transportation. RN to give DC packet to patient when medically ready to leave.  CSW is signing off but available if needed.  [place], [place] [phone]

## 2014-11-25 NOTE — Progress Notes (Signed)
Occupational Therapy Treatment Patient Details Name: Garnetta Fedrick MRN: [id number] DOB: [birth year] Today's Date: 11/25/2014    History of present illness [name]   OT comments  DC plan now SNF as pt did not have 24/7 A at home  Follow Up Recommendations    SNF   Equipment Recommendations  None recommended by OT       Precautions / Restrictions Precautions Precautions: Fall       Mobility Bed Mobility   Bed Mobility: Supine to Sit     Supine to sit: Supervision;HOB elevated     General bed mobility comments: increased time  Transfers Overall transfer level: Needs assistance Equipment used: Rolling walker (2 wheeled) Transfers: Sit to/from Stand Sit to Stand: Min assist Stand pivot transfers: Min assist       General transfer comment: increased time and VC        ADL                           Toilet Transfer: Minimal assistance;Comfort height toilet;Ambulation;RW   Toileting- Clothing Manipulation and Hygiene: Minimal assistance;Sit to/from stand;Cueing for sequencing;Cueing for safety         General ADL Comments: increased time to perform all activity. DC plan now SNF as pt does not have 24/7 A      Vision                            Cognition   Behavior During Therapy: Flat affect Overall Cognitive Status: Within Functional Limits for tasks assessed                       Extremity/Trunk Assessment                     General Comments  increased time for all tasks    Pertinent Vitals/ Pain       Pain Score: 4  Pain Location: r hip Pain Descriptors / Indicators: Sore Pain Intervention(s): Monitored during session         Frequency Min 2X/week     Progress Toward Goals  OT Goals(current goals can now be found in the care plan section)  Progress towards OT goals: Progressing toward goals     Plan Discharge plan needs to be updated       End of Session Equipment Utilized During Treatment:  Rolling walker   Activity Tolerance Patient tolerated treatment well   Patient Left with call bell/phone within reach;in bed;with nursing/sitter in room   Nurse Communication          Time: 3212-2482 OT Time Calculation (min): 30 min  Charges: OT General Charges $OT Visit: 1 Procedure OT Treatments $Self Care/Home Management : 23-37 mins  Kendricks Reap, Thereasa Parkin 11/25/2014, 8:41 AM

## 2014-11-25 NOTE — Progress Notes (Signed)
Physical Therapy Treatment Patient Details Name: Teresa French MRN: [id number] DOB: 1940-04-17 Today's Date: 11/25/2014    History of Present Illness [name]    PT Comments    POD # 4 pt dressed and in recliner.  Assisted with amb a greater distance in hallway.  Returned to room and performed THR TE's followed by ICE.   Follow Up Recommendations  SNF     Equipment Recommendations       Recommendations for Other Services       Precautions / Restrictions Precautions Precautions: Fall Restrictions Weight Bearing Restrictions: No Other Position/Activity Restrictions: WBAT    Mobility  Bed Mobility               General bed mobility comments: Pt OOB in recliner  Transfers Overall transfer level: Needs assistance   Transfers: Sit to/from Stand Sit to Stand: Min assist         General transfer comment: increased time and VC  Ambulation/Gait Ambulation/Gait assistance: Min assist Ambulation Distance (Feet): 34 Feet Assistive device: Rolling walker (2 wheeled) Gait Pattern/deviations: Step-to pattern;Decreased stance time - right     General Gait Details: assist for RW direction, intermittent assist to advance RLE d/t incr pain with activity, cues for sequence adn safety   Stairs            Wheelchair Mobility    Modified Rankin (Stroke Patients Only)       Balance                                    Cognition Arousal/Alertness: Awake/alert Behavior During Therapy: WFL for tasks assessed/performed Overall Cognitive Status: Within Functional Limits for tasks assessed                      Exercises   Total Hip Replacement TE's 10 reps ankle pumps 10 reps knee presses 10 reps heel slides 10 reps SAQ's 10 reps ABD Followed by ICE     General Comments        Pertinent Vitals/Pain Pain Assessment: 0-10 Pain Score: 3  Pain Location: R hip Pain Descriptors / Indicators: Sore;Tightness Pain Intervention(s):  Monitored during session;Repositioned;Ice applied    Home Living                      Prior Function            PT Goals (current goals can now be found in the care plan section) Progress towards PT goals: Progressing toward goals    Frequency  7X/week    PT Plan      Co-evaluation             End of Session Equipment Utilized During Treatment: Gait belt Activity Tolerance: Patient tolerated treatment well Patient left: in chair;with call bell/phone within reach;with family/visitor present;with chair alarm set     Time: 1740-8144 PT Time Calculation (min) (ACUTE ONLY): 25 min  Charges:  $Gait Training: 8-22 mins $Therapeutic Exercise: 8-22 mins                    G Codes:      .Teresa French  PTA [facility]  Acute  Rehab

## 2014-11-25 NOTE — Progress Notes (Signed)
Patient ID: Teresa French, female   DOB: 10-30-1940, 74 y.o.   MRN: [id number] No acute changes.  Slow going with mobility and therapy.  Would benefits from short-term SNF placement.  Vitals stable.  Right hip stable.  Can discharge today.

## 2014-11-25 NOTE — Discharge Summary (Signed)
Patient ID: Teresa French MRN: [id number] DOB/AGE: Aug 05, 1940 74 y.o.  Admit date: 11/21/2014 Discharge date: 11/25/2014  Admission Diagnoses:  Principal Problem:   Osteoarthritis of right hip Active Problems:   Status post total replacement of right hip   Discharge Diagnoses:  Same  Past Medical History  Diagnosis Date  . Complication of anesthesia     "I SLEEP A VERY VERY LONG TIME"  . Hypertension   . Hyperlipidemia   . Environmental allergies   . Arthritis   . Tingling     OF FEET  . Eczema   . History of transfusion   . Borderline diabetes   . Glaucoma   . Asthma     Surgeries: Procedure(s): RIGHT TOTAL HIP ARTHROPLASTY ANTERIOR APPROACH on 11/21/2014   Consultants:    Discharged Condition: Improved  Hospital Course: Teresa French is an 74 y.o. female who was admitted 11/21/2014 for operative treatment ofOsteoarthritis of right hip. Patient has severe unremitting pain that affects sleep, daily activities, and work/hobbies. After pre-op clearance the patient was taken to the operating room on 11/21/2014 and underwent  Procedure(s): RIGHT TOTAL HIP ARTHROPLASTY ANTERIOR APPROACH.    Patient was given perioperative antibiotics: Anti-infectives    Start     Dose/Rate Route Frequency Ordered Stop   11/21/14 1800  ceFAZolin (ANCEF) IVPB 1 g/50 mL premix     1 g 100 mL/hr over 30 Minutes Intravenous Every 6 hours 11/21/14 1609 11/22/14 0025   11/21/14 1027  ceFAZolin (ANCEF) IVPB 2 g/50 mL premix     2 g 100 mL/hr over 30 Minutes Intravenous On call to O.R. 11/21/14 1027 11/21/14 1221       Patient was given sequential compression devices, early ambulation, and chemoprophylaxis to prevent DVT.  Patient benefited maximally from hospital stay and there were no complications.    Recent vital signs: Patient Vitals for the past 24 hrs:  BP Temp Temp src Pulse Resp SpO2  11/25/14 0521 (!) 145/57 mmHg 99.1 F (37.3 C) Oral 92 16 95 %  11/24/14 2105 (!) 111/49 mmHg 99.2  F (37.3 C) Oral (!) 105 16 97 %  11/24/14 2018 - - - - - 94 %  11/24/14 1400 (!) 144/58 mmHg 97.3 F (36.3 C) Oral 93 18 97 %  11/24/14 0913 - - - - - 94 %     Recent laboratory studies:  Recent Labs  11/23/14 0555 11/24/14 0453  WBC 9.6 9.3  HGB 10.6* 10.2*  HCT 32.3* 30.1*  PLT 155 146*     Discharge Medications:     Medication List    STOP taking these medications        celecoxib 200 MG capsule  Commonly known as:  CELEBREX      TAKE these medications        aspirin 325 MG EC tablet  Take 1 tablet (325 mg total) by mouth 2 (two) times daily after a meal.     CALCIUM + D PO  Take 1 tablet by mouth daily.     dorzolamide-timolol 22.3-6.8 MG/ML ophthalmic solution  Commonly known as:  COSOPT  Place 1 drop into both eyes 2 (two) times daily.     Fluticasone-Salmeterol 100-50 MCG/DOSE Aepb  Commonly known as:  ADVAIR  Inhale 1 puff into the lungs daily. ONCE DAILY     hydrochlorothiazide 25 MG tablet  Commonly known as:  HYDRODIURIL  Take 25 mg by mouth every morning.     ibandronate 150 MG tablet  Commonly  known as:  BONIVA  Take 150 mg by mouth every 30 (thirty) days. Take in the morning with a full glass of water, on an empty stomach, and do not take anything else by mouth or lie down for the next 30 min.     latanoprost 0.005 % ophthalmic solution  Commonly known as:  XALATAN  Place 1 drop into both eyes at bedtime.     leflunomide 20 MG tablet  Commonly known as:  ARAVA  Take 20 mg by mouth daily.     NIFEdipine 30 MG 24 hr tablet  Commonly known as:  PROCARDIA-XL/ADALAT-CC/NIFEDICAL-XL  Take 30 mg by mouth every morning.     potassium chloride 10 MEQ tablet  Commonly known as:  K-DUR,KLOR-CON  Take 10 mEq by mouth daily.     rosuvastatin 20 MG tablet  Commonly known as:  CRESTOR  Take 20 mg by mouth daily.     traMADol 50 MG tablet  Commonly known as:  ULTRAM  Take 1-2 tablets (50-100 mg total) by mouth every 6 (six) hours as needed  for moderate pain.        Diagnostic Studies: Dg C-arm 61-120 Min-no Report  11/21/2014   CLINICAL DATA: surgery   C-ARM 61-120 MINUTES  Fluoroscopy was utilized by the requesting physician.  No radiographic  interpretation.    Dg Hip Port Unilat With Pelvis 1v Right  11/21/2014   CLINICAL DATA:  Status post anterior approach right hip replacement today.  EXAM: DG HIP (WITH OR WITHOUT PELVIS) 1V PORT RIGHT  COMPARISON:  Intraoperative fluoroscopic spot views earlier today.  FINDINGS: Right total hip arthroplasty is in place. The device is located and no fracture is identified. Gas in the soft tissues from surgery noted. Old left hip replacement is also noted.  IMPRESSION: Right total hip without evidence of complication.   Electronically Signed   By: Inge Rise M.D.   On: 11/21/2014 15:14   Dg Hip Operative Unilat With Pelvis Right  11/21/2014   CLINICAL DATA:  Right total hip replacement.  EXAM: OPERATIVE RIGHT HIP (WITH PELVIS IF PERFORMED) 2 VIEWS  TECHNIQUE: Fluoroscopic spot image(s) were submitted for interpretation post-operatively.  COMPARISON:  None  FINDINGS: Reported fluoroscopy time 0.4 minutes.  2 images are submitted, showing bilateral hip arthroplasty. Views of the right hip are performed in frontal projection and show no dislocation. No acute fracture identified. Postoperative gas identified the soft tissues around the hip.  IMPRESSION: Status post bilateral hip arthroplasty. Recent postoperative changes on the right.   Electronically Signed   By: Nolon Nations M.D.   On: 11/21/2014 14:09    Disposition: to skilled nursing facility      Discharge Instructions    Discharge patient    Complete by:  As directed      Full weight bearing    Complete by:  As directed            Follow-up Information    Follow up with Teresa Rossetti, MD In 2 weeks.   Specialty:  Orthopedic Surgery   Contact information:   [place]  [address] [phone]       Follow up with [facility].   Why:  Home Health Physical Therapy   Contact information:   [address] [place] [address] [phone]       Follow up with Teresa Rossetti, MD In 2 weeks.   Specialty:  Orthopedic Surgery   Contact information:  [place] [address] [phone]        Signed: Mcarthur French 11/25/2014, 7:23 AM   !

## 2014-11-26 ENCOUNTER — Encounter: Payer: Self-pay | Admitting: Adult Health

## 2014-11-26 ENCOUNTER — Non-Acute Institutional Stay (SKILLED_NURSING_FACILITY): Payer: Medicare Other | Admitting: Adult Health

## 2014-11-26 DIAGNOSIS — H409 Unspecified glaucoma: Secondary | ICD-10-CM

## 2014-11-26 DIAGNOSIS — D62 Acute posthemorrhagic anemia: Secondary | ICD-10-CM

## 2014-11-26 DIAGNOSIS — M810 Age-related osteoporosis without current pathological fracture: Secondary | ICD-10-CM

## 2014-11-26 DIAGNOSIS — M1611 Unilateral primary osteoarthritis, right hip: Secondary | ICD-10-CM | POA: Diagnosis not present

## 2014-11-26 DIAGNOSIS — I10 Essential (primary) hypertension: Secondary | ICD-10-CM

## 2014-11-26 DIAGNOSIS — Z96649 Presence of unspecified artificial hip joint: Secondary | ICD-10-CM | POA: Diagnosis not present

## 2014-11-26 DIAGNOSIS — K5900 Constipation, unspecified: Secondary | ICD-10-CM | POA: Diagnosis not present

## 2014-11-26 DIAGNOSIS — J4520 Mild intermittent asthma, uncomplicated: Secondary | ICD-10-CM

## 2014-11-26 DIAGNOSIS — E876 Hypokalemia: Secondary | ICD-10-CM | POA: Diagnosis not present

## 2014-11-26 DIAGNOSIS — E785 Hyperlipidemia, unspecified: Secondary | ICD-10-CM

## 2014-11-26 DIAGNOSIS — M1990 Unspecified osteoarthritis, unspecified site: Secondary | ICD-10-CM | POA: Diagnosis not present

## 2014-11-26 DIAGNOSIS — Z96641 Presence of right artificial hip joint: Secondary | ICD-10-CM

## 2014-11-26 NOTE — Progress Notes (Addendum)
Patient ID: Teresa French, female   DOB: 1941/03/14, 74 y.o.   MRN: [id number]    DATE:  11/26/2014 MRN:  [id number]  BIRTHDAY: Aug 25, 1940  Facility:  Nursing Home Location:  [facility] Room Number: 1201-P  LEVEL OF CARE:  SNF (31)  Contact Information    Name Relation Home Work [place] Daughter [phone]  [phone]       Chief Complaint  Patient presents with  . Hospitalization Follow-up    Osteoarthritis S/P right total hip arthroplasty glaucoma, asthma, hypertension, osteoporosis, arthritis, hypokalemia, hyperlipidemia, anemia and constipation    HISTORY OF PRESENT ILLNESS:  This is a 74 year old female who was been admitted to [facility] on 11/25/14 from [facility]. She has PMH of hypertension, environmental allergies, borderline diabetes mellitus, glaucoma and asthma. She has osteoarthritis of right hip for which she had right total hip arthroplasty on 11/21/14.  She has verbalized that tramadol makes her confused and refuses to take them.  She has been admitted for a short-term rehabilitation.  PAST MEDICAL HISTORY:  Past Medical History  Diagnosis Date  . Complication of anesthesia     "I SLEEP A VERY VERY LONG TIME"  . Hypertension   . Hyperlipidemia   . Environmental allergies   . Arthritis   . Tingling     OF FEET  . Eczema   . History of transfusion   . Borderline diabetes   . Glaucoma   . Asthma      CURRENT MEDICATIONS: Reviewed  Patient's Medications  New Prescriptions   No medications on file  Previous Medications   ASPIRIN EC 325 MG EC TABLET    Take 1 tablet (325 mg total) by mouth 2 (two) times daily after a meal.   CALCIUM CITRATE-VITAMIN D (CALCIUM + D PO)    Take 1 tablet by mouth daily.   DORZOLAMIDE-TIMOLOL (COSOPT) 22.3-6.8 MG/ML OPHTHALMIC SOLUTION    Place 1 drop into both eyes 2 (two) times daily.   FLUTICASONE-SALMETEROL (ADVAIR) 100-50 MCG/DOSE AEPB    Inhale 1 puff  into the lungs daily. ONCE DAILY   HYDROCHLOROTHIAZIDE (HYDRODIURIL) 25 MG TABLET    Take 25 mg by mouth every morning.   IBANDRONATE (BONIVA) 150 MG TABLET    Take 150 mg by mouth every 30 (thirty) days. Take in the morning with a full glass of water, on an empty stomach, and do not take anything else by mouth or lie down for the next 30 min.   LATANOPROST (XALATAN) 0.005 % OPHTHALMIC SOLUTION    Place 1 drop into both eyes at bedtime.   LEFLUNOMIDE (ARAVA) 20 MG TABLET    Take 20 mg by mouth daily.   NIFEDIPINE (PROCARDIA-XL/ADALAT-CC/NIFEDICAL-XL) 30 MG 24 HR TABLET    Take 30 mg by mouth every morning.   POTASSIUM CHLORIDE (K-DUR,KLOR-CON) 10 MEQ TABLET    Take 10 mEq by mouth daily.   ROSUVASTATIN (CRESTOR) 20 MG TABLET    Take 20 mg by mouth daily.   SENNOSIDES-DOCUSATE SODIUM (SENOKOT-S) 8.6-50 MG TABLET    Take 1 tablet by mouth 2 (two) times daily.  Modified Medications   No medications on file  Discontinued Medications   TRAMADOL (ULTRAM) 50 MG TABLET    Take 1-2 tablets (50-100 mg total) by mouth every 6 (six) hours as needed for moderate pain.     No Known Allergies   REVIEW OF SYSTEMS:  GENERAL: no change in appetite, no fatigue, no weight  changes, no fever, chills or weakness EYES: Denies change in vision, dry eyes, eye pain, itching or discharge EARS: Denies change in hearing, ringing in ears, or earache NOSE: Denies nasal congestion or epistaxis MOUTH and THROAT: Denies oral discomfort, gingival pain or bleeding, pain from teeth or hoarseness   RESPIRATORY: no cough, SOB, DOE, wheezing, hemoptysis CARDIAC: no chest pain, edema or palpitations GI: no abdominal pain, diarrhea, constipation, heart burn, nausea or vomiting GU: Denies dysuria, frequency, hematuria, incontinence, or discharge  PSYCHIATRIC: Denies feeling of depression or anxiety. No report of hallucinations, insomnia, paranoia, or agitation ENDOCRINE: Denies polyphagia, polyuria, polydipsia, heat or cold  intolerance HEME/LYMPH: Denies excessive bruising, petechia, enlarged lymph nodes, or bleeding problems IMMUNOLOGIC: Denies history of frequent infections, AIDS, or use of immunosuppressive agents    PHYSICAL EXAMINATION  GENERAL APPEARANCE: Well nourished. In no acute distress. Normal body habitus SKIN:  Right hip surgical site has Aquacel dressing , dry and no erythema  HEAD: Normal in size and contour. No evidence of trauma EYES: Lids open and close normally. No blepharitis, entropion or ectropion. PERRL. Conjunctivae are clear and sclerae are white. Lenses are without opacity EARS: Pinnae are normal. Patient hears normal voice tunes of the examiner MOUTH and THROAT: Lips are without lesions. Oral mucosa is moist and without lesions. Tongue is normal in shape, size, and color and without lesions NECK: supple, trachea midline, no neck masses, no thyroid tenderness, no thyromegaly LYMPHATICS: no LAN in the neck, no supraclavicular LAN RESPIRATORY: breathing is even & unlabored, BS CTAB CARDIAC: RRR, no murmur,no extra heart sounds, no edema GI: abdomen soft, normal BS, no masses, no tenderness, no hepatomegaly, no splenomegaly EXTREMITIES:  Able to move 4 extremities PSYCHIATRIC: Alert and oriented X 3. Affect and behavior are appropriate  LABS/RADIOLOGY: Labs reviewed: Basic Metabolic Panel:  Recent Labs  11/12/14 1055 11/22/14 0445  NA 142 134*  K 3.8 2.8*  CL 104 102  CO2 28 25  GLUCOSE 109* 135*  BUN 10 7  CREATININE 0.61 0.61  CALCIUM 8.8* 7.8*   CBC:  Recent Labs  11/22/14 0445 11/23/14 0555 11/24/14 0453  WBC 7.8 9.6 9.3  HGB 10.8* 10.6* 10.2*  HCT 34.1* 32.3* 30.1*  MCV 87.4 85.9 85.3  PLT 156 155 146*   CBG:  Recent Labs  11/21/14 1109  GLUCAP 101*     Dg C-arm 61-120 Min-no Report  11/21/2014   CLINICAL DATA: surgery   C-ARM 61-120 MINUTES  Fluoroscopy was utilized by the requesting physician.  No radiographic  interpretation.    Dg Hip Port  Unilat With Pelvis 1v Right  11/21/2014   CLINICAL DATA:  Status post anterior approach right hip replacement today.  EXAM: DG HIP (WITH OR WITHOUT PELVIS) 1V PORT RIGHT  COMPARISON:  Intraoperative fluoroscopic spot views earlier today.  FINDINGS: Right total hip arthroplasty is in place. The device is located and no fracture is identified. Gas in the soft tissues from surgery noted. Old left hip replacement is also noted.  IMPRESSION: Right total hip without evidence of complication.   Electronically Signed   By: Inge Rise M.D.   On: 11/21/2014 15:14   Dg Hip Operative Unilat With Pelvis Right  11/21/2014   CLINICAL DATA:  Right total hip replacement.  EXAM: OPERATIVE RIGHT HIP (WITH PELVIS IF PERFORMED) 2 VIEWS  TECHNIQUE: Fluoroscopic spot image(s) were submitted for interpretation post-operatively.  COMPARISON:  None  FINDINGS: Reported fluoroscopy time 0.4 minutes.  2 images are submitted, showing bilateral hip  arthroplasty. Views of the right hip are performed in frontal projection and show no dislocation. No acute fracture identified. Postoperative gas identified the soft tissues around the hip.  IMPRESSION: Status post bilateral hip arthroplasty. Recent postoperative changes on the right.   Electronically Signed   By: Nolon Nations M.D.   On: 11/21/2014 14:09    ASSESSMENT/PLAN:  Osteoarthritis S/P right total hip arthroplasty - for rehabilitation; discontinue tramadol and start Tylenol 500 mg 2 tabs = 1000 mg by mouth every 4 hours when necessary for pain; RLE full  weightbearing; follow-up with Dr. Jean Rosenthal, orthopedic surgeon in 2 weeks  Glaucoma - continue Cosopt 22.3-6.8 mg/ML 1 drop to both eyes twice a day and Xalatan 0.005% 1 drop to both eyes at bedtime  Asthma - continue Advair 100-50 mcg/dose AEPB 1 puff into the lungs daily  Hypertension - well controlled; continue HCTZ 25 mg 1 tab by mouth every morning and nifedipine 30 mg 24 hour 1 tab by mouth every  morning  Osteoporosis - continue Boniva 150 mg 1 tab by mouth every 30 days  Arthritis - continue Arava 20 mg 1 tab by mouth daily  Hypokalemia - K 2.8; continue K-Dur 10 MEQ one tab by mouth daily; check CMP  Hyperlipidemia - continue Crestor 20 mg 1 tab by mouth daily  Anemia, acute blood loss - hemoglobin 10.2; check CBC  Constipation - start senna S1 tab by mouth twice a day    Goals of care:  Short-term rehabilitation    [facility], [facility] [phone]

## 2014-11-28 ENCOUNTER — Non-Acute Institutional Stay (SKILLED_NURSING_FACILITY): Payer: Medicare Other | Admitting: Internal Medicine

## 2014-11-28 DIAGNOSIS — J45901 Unspecified asthma with (acute) exacerbation: Secondary | ICD-10-CM | POA: Diagnosis not present

## 2014-11-28 DIAGNOSIS — M810 Age-related osteoporosis without current pathological fracture: Secondary | ICD-10-CM | POA: Diagnosis not present

## 2014-11-28 DIAGNOSIS — E785 Hyperlipidemia, unspecified: Secondary | ICD-10-CM | POA: Diagnosis not present

## 2014-11-28 DIAGNOSIS — I10 Essential (primary) hypertension: Secondary | ICD-10-CM

## 2014-11-28 DIAGNOSIS — M1611 Unilateral primary osteoarthritis, right hip: Secondary | ICD-10-CM

## 2014-11-28 DIAGNOSIS — D62 Acute posthemorrhagic anemia: Secondary | ICD-10-CM | POA: Diagnosis not present

## 2014-11-28 DIAGNOSIS — K5900 Constipation, unspecified: Secondary | ICD-10-CM

## 2014-11-28 DIAGNOSIS — M62838 Other muscle spasm: Secondary | ICD-10-CM

## 2014-11-28 DIAGNOSIS — E876 Hypokalemia: Secondary | ICD-10-CM | POA: Diagnosis not present

## 2014-11-28 NOTE — Progress Notes (Signed)
Patient ID: Teresa French, female   DOB: [birth year], 74 y.o.   MRN: [id number]      [facility]   PCP: AMAEFULE,CELESTINE I, MD  Code Status: full code  No Known Allergies  Chief Complaint  Patient presents with  . New Admit To SNF     HPI:  74 y.o. patient is here for short term rehabilitation post hospital admission from 11/21/14-11/25/14 with right hip OA. She underwent right total hip arthroplasty. She is seen in her room today. Her pain is under control. Her daughter is present in the room and would like her mother to have only tylenol for pain. She has muscle tightness.   Review of Systems:  Constitutional: Negative for fever, chills, diaphoresis.  HENT: Negative for headache, congestion, nasal discharge   Eyes: Negative for eye pain, blurred vision, double vision and discharge.  Respiratory: Negative for cough, shortness of breath and wheezing.   Cardiovascular: Negative for chest pain, palpitations, leg swelling.  Gastrointestinal: Negative for heartburn, nausea, vomiting, abdominal pain. Had bowel movement yesterday Genitourinary: Negative for dysuria  Musculoskeletal: Negative for back pain, falls Skin: Negative for itching, rash.  Neurological: Negative for dizziness, tingling, focal weakness Psychiatric/Behavioral: Negative for depression   Past Medical History  Diagnosis Date  . Complication of anesthesia     "I SLEEP A VERY VERY LONG TIME"  . Hypertension   . Hyperlipidemia   . Environmental allergies   . Arthritis   . Tingling     OF FEET  . Eczema   . History of transfusion   . Borderline diabetes   . Glaucoma   . Asthma    Past Surgical History  Procedure Laterality Date  . Ganglion cyst excision      X3  . Breast biopsy      3-5 EACH BREAST  . Dilation and curettage of uterus    . Cervical laminectomy    . Joint replacement  2007    left hip  . Total hip arthroplasty Right 11/21/2014    Procedure: RIGHT TOTAL  HIP ARTHROPLASTY ANTERIOR APPROACH;  Surgeon: Mcarthur Rossetti, MD;  Location: [facility];  Service: Orthopedics;  Laterality: Right;   Social History:   reports that she has never smoked. She does not have any smokeless tobacco history on file. She reports that she does not drink alcohol or use illicit drugs.  No family history on file.  Medications:   Medication List       This list is accurate as of: 11/28/14 11:56 AM.  Always use your most recent med list.               aspirin 325 MG EC tablet  Take 1 tablet (325 mg total) by mouth 2 (two) times daily after a meal.     CALCIUM + D PO  Take 1 tablet by mouth daily.     dorzolamide-timolol 22.3-6.8 MG/ML ophthalmic solution  Commonly known as:  COSOPT  Place 1 drop into both eyes 2 (two) times daily.     Fluticasone-Salmeterol 100-50 MCG/DOSE Aepb  Commonly known as:  ADVAIR  Inhale 1 puff into the lungs daily. ONCE DAILY     hydrochlorothiazide 25 MG tablet  Commonly known as:  HYDRODIURIL  Take 25 mg by mouth every morning.     ibandronate 150 MG tablet  Commonly known as:  BONIVA  Take 150 mg by mouth every 30 (thirty) days. Take in the morning with a full glass  of water, on an empty stomach, and do not take anything else by mouth or lie down for the next 30 min.     latanoprost 0.005 % ophthalmic solution  Commonly known as:  XALATAN  Place 1 drop into both eyes at bedtime.     leflunomide 20 MG tablet  Commonly known as:  ARAVA  Take 20 mg by mouth daily.     NIFEdipine 30 MG 24 hr tablet  Commonly known as:  PROCARDIA-XL/ADALAT-CC/NIFEDICAL-XL  Take 30 mg by mouth every morning.     potassium chloride 10 MEQ tablet  Commonly known as:  K-DUR,KLOR-CON  Take 10 mEq by mouth daily.     rosuvastatin 20 MG tablet  Commonly known as:  CRESTOR  Take 20 mg by mouth daily.     sennosides-docusate sodium 8.6-50 MG tablet  Commonly known as:  SENOKOT-S  Take 1 tablet by mouth 2 (two) times daily.          Physical Exam Filed Vitals:   11/28/14 1150  BP: 129/74  Pulse: 88  Temp: 98.5 F (36.9 C)  Resp: 16  SpO2: 96%    General- elderly female, well built, in no acute distress Head- normocephalic, atraumatic Throat- moist mucus membrane Eyes- PERRLA, EOMI, no pallor, no icterus, no discharge, normal conjunctiva, normal sclera Neck- no cervical lymphadenopathy Cardiovascular- normal s1,s2, no murmurs, palpable dorsalis pedis and radial pulses, trace right leg edema Respiratory- bilateral clear to auscultation, no wheeze, no rhonchi, no crackles, no use of accessory muscles Abdomen- bowel sounds present, soft, non tender Musculoskeletal- able to move all 4 extremities, right leg ROM limited and muscle spasm + Neurological- no focal deficit, alert and oriented to person, place and time Skin- warm and dry, right hip surgical incision with aquacel dressing Psychiatry- normal mood and affect    Labs reviewed: Basic Metabolic Panel:  Recent Labs  11/12/14 1055 11/22/14 0445  NA 142 134*  K 3.8 2.8*  CL 104 102  CO2 28 25  GLUCOSE 109* 135*  BUN 10 7  CREATININE 0.61 0.61  CALCIUM 8.8* 7.8*   CBC:  Recent Labs  11/22/14 0445 11/23/14 0555 11/24/14 0453  WBC 7.8 9.6 9.3  HGB 10.8* 10.6* 10.2*  HCT 34.1* 32.3* 30.1*  MCV 87.4 85.9 85.3  PLT 156 155 146*   CBG:  Recent Labs  11/21/14 1109  GLUCAP 101*     Assessment/Plan  Right hip Osteoarthritis  S/P right total hip arthroplasty. Will have her work with physical therapy and occupational therapy team to help with gait training and muscle strengthening exercises.fall precautions. Skin care. Encourage to be out of bed. continue tylenol 1000 mg q4h prn pain. Has f/u with orthopedics. Continue aspirin 325 mg bid for dvt prophylaxis. RLE WBAT.   Blood loss anemia Post op, monitor h&h  Muscle spasm Add robaxin 500 mg q8h prn spasm and monitor  Hypokalemia Continue kcl supplement, check  bmp  HTN Stable bo, continue hctz 25 mg qd with nifedipine 30 mg qd, monitor bp  Osteoporosis Continue boniva once a month. D/c po fosamax which is a formulary substitute  Asthma Stable. continue Advair and monitor  Hyperlipidemia  continue Crestor 20 mg daily  Constipation  Stable, wants her senna s changed to daily as needed from bid, changes made   Goals of care: short term rehabilitation   Labs/tests ordered: cbc, bmp  Family/ staff Communication: reviewed care plan with patient and nursing supervisor    Blanchie Serve, [place]  [facility] [phone] (Monday-Friday 8 am - 5 pm) [phone] (afterhours)

## 2014-12-11 ENCOUNTER — Non-Acute Institutional Stay (SKILLED_NURSING_FACILITY): Payer: Medicare Other | Admitting: Adult Health

## 2014-12-11 ENCOUNTER — Encounter: Payer: Self-pay | Admitting: Adult Health

## 2014-12-11 DIAGNOSIS — E785 Hyperlipidemia, unspecified: Secondary | ICD-10-CM

## 2014-12-11 DIAGNOSIS — K5900 Constipation, unspecified: Secondary | ICD-10-CM

## 2014-12-11 DIAGNOSIS — I10 Essential (primary) hypertension: Secondary | ICD-10-CM | POA: Diagnosis not present

## 2014-12-11 DIAGNOSIS — J45901 Unspecified asthma with (acute) exacerbation: Secondary | ICD-10-CM

## 2014-12-11 DIAGNOSIS — M1611 Unilateral primary osteoarthritis, right hip: Secondary | ICD-10-CM | POA: Diagnosis not present

## 2014-12-11 DIAGNOSIS — D62 Acute posthemorrhagic anemia: Secondary | ICD-10-CM | POA: Diagnosis not present

## 2014-12-11 DIAGNOSIS — E876 Hypokalemia: Secondary | ICD-10-CM | POA: Diagnosis not present

## 2014-12-11 DIAGNOSIS — M810 Age-related osteoporosis without current pathological fracture: Secondary | ICD-10-CM

## 2014-12-11 NOTE — Progress Notes (Signed)
Patient ID: Teresa French, female   DOB: 07-18-1940, 74 y.o.   MRN: [id number]    DATE:  12/11/2014 MRN:  [id number]  BIRTHDAY: 1941-02-11  Facility:  [facility]urs[facility] Home Location:  [place] Room Number: 1201-P  LEVEL OF CARE:  SNF (31)  Contact Information    Name Relation Home Work Abernathy Daughter [phone]  [phone]       Chief Complaint  Patient presents with  . Discharge Note    Osteoarthritis S/P right total hip arthroplasty glaucoma, asthma, hypertension, osteoporosis, arthritis, hypokalemia, hyperlipidemia, anemia and constipation    HISTORY OF PRESENT ILLNESS:  This is a 74 year old female who is for discharge home with Home health PT and OT. She has been admitted to [facility] on 11/25/14 from [facility]. She has PMH of hypertension, environmental allergies, borderline diabetes mellitus, glaucoma and asthma. She has osteoarthritis of right hip for which she had right total hip arthroplasty on 11/21/14.  Patient was admitted to this facility for short-term rehabilitation after the patient's recent hospitalization.  Patient has completed SNF rehabilitation and therapy has cleared the patient for discharge.  PAST MEDICAL HISTORY:  Past Medical History  Diagnosis Date  . Complication of anesthesia     "I SLEEP A VERY VERY LONG TIME"  . Hypertension   . Hyperlipidemia   . Environmental allergies   . Arthritis   . Tingling     OF FEET  . Eczema   . History of transfusion   . Borderline diabetes   . Glaucoma   . Asthma      CURRENT MEDICATIONS: Reviewed  Patient's Medications  New Prescriptions   No medications on file  Previous Medications   ACETAMINOPHEN (TYLENOL) 500 MG TABLET    Take by mouth every 6 (six) hours as needed. Take 2 tabs = 1,000 mg PO Q 6 H PRN   ASPIRIN EC 325 MG EC TABLET    Take 1 tablet (325 mg total) by mouth 2 (two) times daily after a meal.   CALCIUM CITRATE-VITAMIN D  (CALCIUM + D PO)    Take 1 tablet by mouth daily.   CELECOXIB (CELEBREX) 100 MG CAPSULE    Take 100 mg by mouth 2 (two) times daily.   DORZOLAMIDE-TIMOLOL (COSOPT) 22.3-6.8 MG/ML OPHTHALMIC SOLUTION    Place 1 drop into both eyes 2 (two) times daily.   FLUTICASONE-SALMETEROL (ADVAIR) 100-50 MCG/DOSE AEPB    Inhale 1 puff into the lungs daily. ONCE DAILY   HYDROCHLOROTHIAZIDE (HYDRODIURIL) 25 MG TABLET    Take 25 mg by mouth every morning.   IBANDRONATE (BONIVA) 150 MG TABLET    Take 150 mg by mouth every 30 (thirty) days. Take in the morning with a full glass of water, on an empty stomach, and do not take anything else by mouth or lie down for the next 30 min.   LATANOPROST (XALATAN) 0.005 % OPHTHALMIC SOLUTION    Place 1 drop into both eyes at bedtime.   LEFLUNOMIDE (ARAVA) 20 MG TABLET    Take 20 mg by mouth daily.   METHOCARBAMOL (ROBAXIN) 500 MG TABLET    Take 500 mg by mouth every 8 (eight) hours as needed for muscle spasms. And also Robaxin 500 mg 1 tab PO Q D, Robaxin 1/2 tab= 250mg  PO Q HS   NIFEDIPINE (PROCARDIA-XL/ADALAT-CC/NIFEDICAL-XL) 30 MG 24 HR TABLET    Take 30 mg by mouth every morning.   POTASSIUM CHLORIDE (KLOR-CON) 20 MEQ  PACKET    Take 20 mEq by mouth daily.   ROSUVASTATIN (CRESTOR) 20 MG TABLET    Take 20 mg by mouth daily.   SENNOSIDES-DOCUSATE SODIUM (SENOKOT-S) 8.6-50 MG TABLET    Take 1 tablet by mouth daily as needed for constipation.   TRAMADOL (ULTRAM) 50 MG TABLET    Take 50 mg by mouth every 6 (six) hours as needed. Take 1-2 tabs PO Q 6H PRN  Modified Medications   No medications on file  Discontinued Medications   POTASSIUM CHLORIDE (K-DUR,KLOR-CON) 10 MEQ TABLET    Take 10 mEq by mouth daily.   SENNOSIDES-DOCUSATE SODIUM (SENOKOT-S) 8.6-50 MG TABLET    Take 1 tablet by mouth 2 (two) times daily.     No Known Allergies   REVIEW OF SYSTEMS:  GENERAL: no change in appetite, no fatigue, no weight changes, no fever, chills or weakness EYES: Denies change in  vision, dry eyes, eye pain, itching or discharge EARS: Denies change in hearing, ringing in ears, or earache NOSE: Denies nasal congestion or epistaxis MOUTH and THROAT: Denies oral discomfort, gingival pain or bleeding, pain from teeth or hoarseness   RESPIRATORY: no cough, SOB, DOE, wheezing, hemoptysis CARDIAC: no chest pain, edema or palpitations GI: no abdominal pain, diarrhea, constipation, heart burn, nausea or vomiting GU: Denies dysuria, frequency, hematuria, incontinence, or discharge  PSYCHIATRIC: Denies feeling of depression or anxiety. No report of hallucinations, insomnia, paranoia, or agitation ENDOCRINE: Denies polyphagia, polyuria, polydipsia, heat or cold intolerance HEME/LYMPH: Denies excessive bruising, petechia, enlarged lymph nodes, or bleeding problems IMMUNOLOGIC: Denies history of frequent infections, AIDS, or use of immunosuppressive agents    PHYSICAL EXAMINATION  GENERAL APPEARANCE: Well nourished. In no acute distress. Normal body habitus SKIN:  Right hip surgical site is healed, dry and no redness HEAD: Normal in size and contour. No evidence of trauma EYES: Lids open and close normally. No blepharitis, entropion or ectropion. PERRL. Conjunctivae are clear and sclerae are white. Lenses are without opacity EARS: Pinnae are normal. Patient hears normal voice tunes of the examiner MOUTH and THROAT: Lips are without lesions. Oral mucosa is moist and without lesions. Tongue is normal in shape, size, and color and without lesions NECK: supple, trachea midline, no neck masses, no thyroid tenderness, no thyromegaly LYMPHATICS: no LAN in the neck, no supraclavicular LAN RESPIRATORY: breathing is even & unlabored, BS CTAB CARDIAC: RRR, no murmur,no extra heart sounds, no edema GI: abdomen soft, normal BS, no masses, no tenderness, no hepatomegaly, no splenomegaly EXTREMITIES:  Able to move 4 extremities PSYCHIATRIC: Alert and oriented X 3. Affect and behavior are  appropriate  LABS/RADIOLOGY: Labs reviewed:  11/28/14  WBC 5.0 hemoglobin 10.2 hematocrit 32.9 MCV 90.4 platelet 456 sodium 142 potassium 3.7  glucose  90 BUN 12 creatinine 0.60 calcium 8.8 11/27/14  WBC 4.6 hemoglobin 9.5 hematocrit 29.3 MCV 86.7 platelet 225 sodium 142 potassium 3.4 glucose 99 BUN 10 creatinine 0.57 calcium 8.4 Basic Metabolic Panel:  Recent Labs  11/12/14 1055 11/22/14 0445  NA 142 134*  K 3.8 2.8*  CL 104 102  CO2 28 25  GLUCOSE 109* 135*  BUN 10 7  CREATININE 0.61 0.61  CALCIUM 8.8* 7.8*   CBC:  Recent Labs  11/22/14 0445 11/23/14 0555 11/24/14 0453  WBC 7.8 9.6 9.3  HGB 10.8* 10.6* 10.2*  HCT 34.1* 32.3* 30.1*  MCV 87.4 85.9 85.3  PLT 156 155 146*   CBG:  Recent Labs  11/21/14 1109  GLUCAP 101*  Dg C-arm 61-120 Min-no Report  11/21/2014   CLINICAL DATA: surgery   C-ARM 61-120 MINUTES  Fluoroscopy was utilized by the requesting physician.  No radiographic  interpretation.    Dg Hip Port Unilat With Pelvis 1v Right  11/21/2014   CLINICAL DATA:  Status post anterior approach right hip replacement today.  EXAM: DG HIP (WITH OR WITHOUT PELVIS) 1V PORT RIGHT  COMPARISON:  Intraoperative fluoroscopic spot views earlier today.  FINDINGS: Right total hip arthroplasty is in place. The device is located and no fracture is identified. Gas in the soft tissues from surgery noted. Old left hip replacement is also noted.  IMPRESSION: Right total hip without evidence of complication.   Electronically Signed   By: Inge Rise M.D.   On: 11/21/2014 15:14   Dg Hip Operative Unilat With Pelvis Right  11/21/2014   CLINICAL DATA:  Right total hip replacement.  EXAM: OPERATIVE RIGHT HIP (WITH PELVIS IF PERFORMED) 2 VIEWS  TECHNIQUE: Fluoroscopic spot image(s) were submitted for interpretation post-operatively.  COMPARISON:  None  FINDINGS: Reported fluoroscopy time 0.4 minutes.  2 images are submitted, showing bilateral hip arthroplasty. Views of the right hip  are performed in frontal projection and show no dislocation. No acute fracture identified. Postoperative gas identified the soft tissues around the hip.  IMPRESSION: Status post bilateral hip arthroplasty. Recent postoperative changes on the right.   Electronically Signed   By: Nolon Nations M.D.   On: 11/21/2014 14:09    ASSESSMENT/PLAN:  Osteoarthritis S/P right total hip arthroplasty - for home health PT and OT; continue Robaxin 500 mg 1 tab by mouth every morning and 1/2 tab = 250 mg Q HS and 500 mg Q 6 hours PRN for pain; Tylenol 500 mg 2 tabs = 1000 mg by mouth every 6 hours when necessary and Tramadol 500 mg 1 -2 tabs PO Q 6 hours PRN for pain; RLE full  weightbearing; follow-up with Dr. Jean Rosenthal, orthopedic surgeon   Glaucoma - continue Cosopt 22.3-6.8 mg/ML 1 drop to both eyes twice a day and Xalatan 0.005% 1 drop to both eyes at bedtime  Asthma - continue Advair 100-50 mcg/dose AEPB 1 puff into the lungs daily  Hypertension - well controlled; continue HCTZ 25 mg 1 tab by mouth every morning and nifedipine 30 mg 24 hour 1 tab by mouth every morning  Osteoporosis - continue Boniva 150 mg 1 tab by mouth every 30 days  Arthritis - continue Arava 20 mg 1 tab by mouth daily and Celebrex 100 mg 1 capsule PO BID  Hypokalemia - K 3.7; continue K-Dur 20 MEQ 1 tab by mouth daily  Hyperlipidemia - continue Crestor 20 mg 1 tab by mouth daily  Anemia, acute blood loss - hemoglobin 10.2; improving  Constipation - start senna S1 tab by mouth daily PRN     I have filled out patient's discharge paperwork and written prescriptions.  Patient will receive home health PT and OT.  Total discharge time: Less than 30 minutes  Discharge time involved coordination of the discharge process with Education officer, museum, nursing staff and therapy department. Medical justification for home health services  verified.     [facility], NP [employer] [phone]

## 2015-10-29 ENCOUNTER — Ambulatory Visit (INDEPENDENT_AMBULATORY_CARE_PROVIDER_SITE_OTHER): Payer: Medicare Other | Admitting: Internal Medicine

## 2015-10-29 ENCOUNTER — Encounter: Payer: Self-pay | Admitting: Internal Medicine

## 2015-10-29 ENCOUNTER — Other Ambulatory Visit: Payer: Self-pay | Admitting: Internal Medicine

## 2015-10-29 DIAGNOSIS — M810 Age-related osteoporosis without current pathological fracture: Secondary | ICD-10-CM | POA: Diagnosis not present

## 2015-10-29 DIAGNOSIS — A319 Mycobacterial infection, unspecified: Secondary | ICD-10-CM

## 2015-10-29 DIAGNOSIS — M069 Rheumatoid arthritis, unspecified: Secondary | ICD-10-CM

## 2015-10-29 DIAGNOSIS — M1990 Unspecified osteoarthritis, unspecified site: Secondary | ICD-10-CM | POA: Insufficient documentation

## 2015-10-29 DIAGNOSIS — M1610 Unilateral primary osteoarthritis, unspecified hip: Secondary | ICD-10-CM | POA: Diagnosis not present

## 2015-10-29 DIAGNOSIS — Z8619 Personal history of other infectious and parasitic diseases: Secondary | ICD-10-CM | POA: Insufficient documentation

## 2015-10-29 DIAGNOSIS — A310 Pulmonary mycobacterial infection: Secondary | ICD-10-CM | POA: Insufficient documentation

## 2015-10-29 LAB — CBC
HEMATOCRIT: 42.3 % (ref 35.0–45.0)
Hemoglobin: 13.9 g/dL (ref 11.7–15.5)
MCH: 29 pg (ref 27.0–33.0)
MCHC: 32.9 g/dL (ref 32.0–36.0)
MCV: 88.3 fL (ref 80.0–100.0)
MPV: 10.6 fL (ref 7.5–12.5)
PLATELETS: 222 10*3/uL (ref 140–400)
RBC: 4.79 MIL/uL (ref 3.80–5.10)
RDW: 14.3 % (ref 11.0–15.0)
WBC: 4.9 10*3/uL (ref 3.8–10.8)

## 2015-10-29 LAB — COMPREHENSIVE METABOLIC PANEL
ALK PHOS: 64 U/L (ref 33–130)
ALT: 12 U/L (ref 6–29)
AST: 21 U/L (ref 10–35)
Albumin: 4.2 g/dL (ref 3.6–5.1)
BUN: 8 mg/dL (ref 7–25)
CO2: 29 mmol/L (ref 20–31)
Calcium: 9.5 mg/dL (ref 8.6–10.4)
Chloride: 102 mmol/L (ref 98–110)
Creat: 0.68 mg/dL (ref 0.60–0.93)
GLUCOSE: 91 mg/dL (ref 65–99)
POTASSIUM: 3.2 mmol/L — AB (ref 3.5–5.3)
Sodium: 140 mmol/L (ref 135–146)
TOTAL PROTEIN: 7.3 g/dL (ref 6.1–8.1)
Total Bilirubin: 0.6 mg/dL (ref 0.2–1.2)

## 2015-10-29 NOTE — Progress Notes (Signed)
[facility] for Infectious Disease  Reason for Consult: Smoldering mycobacterial pneumonia Referring Physician: [name]  Patient Active Problem List   Diagnosis Date Noted  . 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 Rheumatoid arthritis ([place]) 10/29/2015  . DJD (degenerative joint disease) 10/29/2015  . Osteoporosis 10/29/2015  . Osteoarthritis of right hip 11/21/2014  . 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    Patient's Medications  New Prescriptions   No medications on file  Previous Medications   ASPIRIN EC 81 MG TABLET    Take 81 mg by mouth daily.   BRIMONIDINE (ALPHAGAN) 0.2 % OPHTHALMIC SOLUTION       CALCIUM CITRATE-VITAMIN D (CALCIUM + D PO)    Take 1 tablet by mouth daily.   CELECOXIB (CELEBREX) 100 MG CAPSULE    Take 100 mg by mouth daily as needed (1-2 tablets per day as needed).    DORZOLAMIDE-TIMOLOL (COSOPT) 22.3-6.8 MG/ML OPHTHALMIC SOLUTION    Place 1 drop into both eyes 2 (two) times daily.   FLUTICASONE-SALMETEROL (ADVAIR) 100-50 MCG/DOSE AEPB    Inhale 1 puff into the lungs daily. ONCE DAILY   HYDROCHLOROTHIAZIDE (HYDRODIURIL) 25 MG TABLET    Take 25 mg by mouth every morning.   IBANDRONATE (BONIVA) 150 MG TABLET    Take 150 mg by mouth every 30 (thirty) days. Take in the morning with a full glass of water, on an empty stomach, and do not take anything else by mouth or lie down for the next 30 min.   LATANOPROST (XALATAN) 0.005 % OPHTHALMIC SOLUTION    Place 1 drop into both eyes at bedtime.   LEFLUNOMIDE (ARAVA) 20 MG TABLET    Take 20 mg by mouth daily.   NIFEDIPINE (PROCARDIA-XL/ADALAT-CC/NIFEDICAL-XL) 30 MG 24 HR TABLET    Take 30 mg by mouth every morning.   POTASSIUM CHLORIDE (KLOR-CON) 20 MEQ PACKET    Take 20 mEq by mouth daily.   ROSUVASTATIN (CRESTOR) 20 MG TABLET    Take 20 mg by mouth daily.  Modified Medications   No medications on file  Discontinued Medications   ACETAMINOPHEN (TYLENOL) 500 MG TABLET    Take by mouth every 6 (six) hours as needed. Take 2 tabs = 1,000 mg PO Q 6 H PRN   ASPIRIN EC 325 MG EC TABLET    Take 1 tablet (325 mg total) by mouth 2 (two) times daily after a meal.   METHOCARBAMOL (ROBAXIN) 500 MG TABLET    Take 500 mg by mouth every 8 (eight) hours as needed for muscle spasms. And also Robaxin 500 mg 1 tab PO Q D, Robaxin 1/2 tab= 250mg  PO Q HS   SENNOSIDES-DOCUSATE SODIUM (SENOKOT-S) 8.6-50 MG TABLET    Take 1 tablet by mouth daily as needed for constipation.   TRAMADOL (ULTRAM) 50 MG TABLET    Take 50 mg by mouth every 6 (six) hours as needed. Take 1-2 tabs PO Q 6H PRN    Recommendations: 1. Await final results of sputum AFB culture obtained on 10/14/2015 2. Obtain CD images of her CT scan done on 09/02/2015 3. Repeat sputum AFB stain and culture today 4. CBC and complete metabolic panel 5. Follow-up here in 3-4 weeks   Assessment: She has smoldering mycobacterial pneumonia. This is unlikely to be tuberculosis. 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 I will await final culture and antibiotic susceptibility results before starting antibiotic therapy.  I will need to review her CT scan to determine if she needs daily antibiotic therapy or can be treated with a Monday, Wednesday, Friday regimen. I did tell her that she is likely to need to take at least 3 antibiotics for at least 12 months to reliably cure this but that her infection should get better with treatment. She will follow-up with me in 3-4 weeks.   HPI: Teresa French is a 75 y.o. female  with rheumatoid arthritis who had the abrupt onset of cough productive of yellow sputum, wheezing, and dyspnea on exertion in April 2016. The problem has slowly been getting worse ever since. Her cough is most notable when she lays down at night and will sometimes keep her awake. She has been put on Advair which she takes every day but she continues to wheeze on a daily basis.  She has a rescue inhaler that she keeps in her purse but she rarely uses it. She recently tried walking 1 mile to a new senior center near where she lives. She states that she would start wheezing and gets short of breath as soon as she started walking. She would have to sit down and rest for 45 minutes before she was able to walk back home. She has had anorexia and an 18 pound unintentional weight loss. As a result of her persistent cough she has developed a very soft, hoarse voice. She had a CT scan of her chest on 09/02/2015 which showed "patchy nodular infiltrates bilaterally with small areas of cavitation."  He sputum AFB culture was obtained on 10/04/2015 and the smear revealed 3+ AFB. She has no known history of TB exposure. She used to work at [employer] and believe she probably had PPDs placed thereby cannot remember. A recent QuantiFERON gold TB assay was negative. She has not had any fever, chills or sweats.  Review of Systems: Review of Systems  Constitutional: Positive for malaise/fatigue and weight loss. Negative for chills, diaphoresis and fever.  HENT: Negative for sore throat.   Respiratory: Positive for cough, sputum production, shortness of breath and wheezing. Negative for hemoptysis.   Cardiovascular: Positive for chest pain.       She recently had some right-sided chest pain that lasted about 3 weeks then resolved spontaneously.  Gastrointestinal: Negative for abdominal pain, diarrhea, nausea and vomiting.  Genitourinary: Negative for dysuria and frequency.  Musculoskeletal: Positive for joint pain. Negative for myalgias.  Skin: Negative for rash.  Neurological: Negative for dizziness, focal weakness and headaches.      Past Medical History:  Diagnosis Date  . Arthritis   . Asthma   . Borderline diabetes   . Complication of anesthesia    "I SLEEP A VERY VERY LONG TIME"  . Eczema   . Environmental allergies   . Glaucoma   . History of transfusion   .  Hyperlipidemia   . Hypertension   . Tingling    OF FEET    Social History  Substance Use Topics  . Smoking status: Never Smoker  . Smokeless tobacco: Not on file  . Alcohol use No    No family history on file. No Known Allergies  OBJECTIVE: Vitals:   10/29/15 1451  BP: (!) 156/75  Pulse: 90  Temp: 98.1 F (36.7 C)  TempSrc: Oral  Weight: 151 lb (68.5 kg)  Height: 5\' 4"  (1.626 m)   Body mass index is 25.92 kg/m.   Physical Exam  Constitutional: She is oriented to person, place, and  time.  She is very pleasant and in no distress. She speaks in a very hoarse, whispered voice. She is accompanied by her daughter, Teresa French, who is a Designer, jewellery here in [place].  HENT:  Mouth/Throat: No oropharyngeal exudate.  Eyes: Conjunctivae are normal.  Cardiovascular: Normal rate and regular rhythm.   No murmur heard. Pulmonary/Chest: Effort normal. No respiratory distress. She has wheezes. She has no rales.  Abdominal: Soft. She exhibits no mass. There is no tenderness.  Neurological: She is alert and oriented to person, place, and time.  Skin: No rash noted.  Psychiatric: Mood and affect normal.    Microbiology: No results found for this or any previous visit (from the past 240 hour(s)).  Michel Bickers, MD [facility] [phone] pager   [phone] cell 10/29/2015, 3:40 PM

## 2015-10-29 NOTE — Assessment & Plan Note (Signed)
She has smoldering mycobacterial pneumonia. This is unlikely to be tuberculosis. 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 I will await final culture and antibiotic susceptibility results before starting antibiotic therapy. I will need to review her CT scan to determine if she needs daily antibiotic therapy or can be treated with a Monday, Wednesday, Friday regimen. I did tell her that she is likely to need to take at least 3 antibiotics for at least 12 months to reliably cure this but that her infection should get better with treatment. She will follow-up with me in 3-4 weeks.

## 2015-10-30 ENCOUNTER — Other Ambulatory Visit: Payer: Medicare Other

## 2015-10-30 ENCOUNTER — Telehealth: Payer: Self-pay | Admitting: *Deleted

## 2015-10-30 DIAGNOSIS — R739 Hyperglycemia, unspecified: Secondary | ICD-10-CM

## 2015-10-30 LAB — HEMOGLOBIN A1C
HEMOGLOBIN A1C: 5.6 % (ref <5.7)
MEAN PLASMA GLUCOSE: 114 mg/dL

## 2015-10-30 NOTE — Addendum Note (Signed)
Addended by: Laverle Patter on: 10/30/2015 12:52 PM   Modules accepted: Orders

## 2015-10-30 NOTE — Telephone Encounter (Signed)
Signed record release request faxed to [facility]. Requested that the CT report be faxed to [phone] and the CD of the CT be mailed to the office. Landis Gandy, RN

## 2015-11-02 ENCOUNTER — Other Ambulatory Visit: Payer: Medicare Other

## 2015-11-06 ENCOUNTER — Telehealth: Payer: Self-pay | Admitting: *Deleted

## 2015-11-06 LAB — AFB CULTURE WITH SMEAR (NOT AT ARMC)

## 2015-11-06 NOTE — Telephone Encounter (Signed)
Call from [facility] lab stating patient's sputum sample was lost in transit to [place]. Myrtis Hopping

## 2015-11-17 ENCOUNTER — Encounter: Payer: Self-pay | Admitting: *Deleted

## 2015-11-17 DIAGNOSIS — R7303 Prediabetes: Secondary | ICD-10-CM | POA: Insufficient documentation

## 2015-11-24 ENCOUNTER — Encounter: Payer: Self-pay | Admitting: Internal Medicine

## 2015-11-24 ENCOUNTER — Ambulatory Visit (INDEPENDENT_AMBULATORY_CARE_PROVIDER_SITE_OTHER): Payer: Medicare Other | Admitting: Internal Medicine

## 2015-11-24 DIAGNOSIS — A319 Mycobacterial infection, unspecified: Secondary | ICD-10-CM

## 2015-11-24 DIAGNOSIS — A310 Pulmonary mycobacterial infection: Secondary | ICD-10-CM

## 2015-11-24 DIAGNOSIS — Z23 Encounter for immunization: Secondary | ICD-10-CM

## 2015-11-24 NOTE — Assessment & Plan Note (Addendum)
We placed 2 phone calls today to Dr. Jason Fila office in [place]. The sputum specimen collected on 09/16/2015 was 3+ AFB positive on smear. The nurse could not locate an AFB culture. I did obtain a CD copy of her chest CT and it did show scattered nodular infiltrates, 1 with a small area of cavitation. 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 The sputum specimen that was collected here at the time of her first visit 1 month ago was lost in transit to [place]. I will collect another specimen today. Teresa French will follow-up with me in 4 weeks. There will be some potential drug drug interactions between antibiotic therapy and her current medications that will need to be monitored.

## 2015-11-24 NOTE — Progress Notes (Signed)
[facility] for Infectious Disease  Patient Active Problem List   Diagnosis Date Noted  . 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 Borderline diabetes   . Rheumatoid arthritis ([place]) 10/29/2015  . DJD (degenerative joint disease) 10/29/2015  . Osteoporosis 10/29/2015  . Osteoarthritis of right hip 11/21/2014  . 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   ASPIRIN EC 81 MG TABLET    Take 81 mg by mouth daily.   BRIMONIDINE (ALPHAGAN) 0.2 % OPHTHALMIC SOLUTION       CALCIUM CITRATE-VITAMIN D (CALCIUM + D PO)    Take 1 tablet by mouth daily.   CELECOXIB (CELEBREX) 100 MG CAPSULE    Take 100 mg by mouth daily as needed (1-2 tablets per day as needed).    DORZOLAMIDE-TIMOLOL (COSOPT) 22.3-6.8 MG/ML OPHTHALMIC SOLUTION    Place 1 drop into both eyes 2 (two) times daily.   FLUTICASONE-SALMETEROL (ADVAIR) 100-50 MCG/DOSE AEPB    Inhale 1 puff into the lungs daily. ONCE DAILY   HYDROCHLOROTHIAZIDE (HYDRODIURIL) 25 MG TABLET    Take 25 mg by mouth every morning.   IBANDRONATE (BONIVA) 150 MG TABLET    Take 150 mg by mouth every 30 (thirty) days. Take in the morning with a full glass of water, on an empty stomach, and do not take anything else by mouth or lie down for the next 30 min.   LATANOPROST (XALATAN) 0.005 % OPHTHALMIC SOLUTION    Place 1 drop into both eyes at bedtime.   LEFLUNOMIDE (ARAVA) 20 MG TABLET    Take 20 mg by mouth daily.   NIFEDIPINE (PROCARDIA-XL/ADALAT-CC/NIFEDICAL-XL) 30 MG 24 HR TABLET    Take 30 mg by mouth every morning.   POTASSIUM CHLORIDE (KLOR-CON) 20 MEQ PACKET    Take 20 mEq by mouth daily.   ROSUVASTATIN (CRESTOR) 20 MG TABLET    Take 20 mg by  mouth daily.  Modified Medications   No medications on file  Discontinued Medications   No medications on file    Subjective: Teresa French is in for her routine follow-up. She is accompanied by her daughter, Teresa French. She states that she is feeling a little bit worse since her last visit. She is still coughing quite a bit at night and bringing up clear sputum. She feels like her wheezing and shortness of breath are worse. She has stopped trying to walk to the senior center on [address] because of her symptoms. She has not had any fever, chills or sweats.  Review of Systems: Review of Systems  Constitutional: Positive for malaise/fatigue. Negative for chills, diaphoresis, fever and weight loss.  HENT: Negative for sore throat.   Respiratory: Positive for cough, sputum production, shortness of breath and wheezing.   Cardiovascular: Negative for chest pain.  Gastrointestinal: Negative for abdominal pain, diarrhea, nausea and vomiting.  Genitourinary: Negative for dysuria.  Musculoskeletal: Positive for joint pain. Negative for myalgias.  Skin: Negative for rash.  Neurological: Negative for headaches.    Past Medical History:  Diagnosis Date  . Arthritis   . Asthma   . Borderline diabetes   . Complication of anesthesia    "I  SLEEP A VERY VERY LONG TIME"  . Eczema   . Environmental allergies   . Glaucoma   . History of transfusion   . Hyperlipidemia   . Hypertension   . Tingling    OF FEET    Social History  Substance Use Topics  . Smoking status: Never Smoker  . Smokeless tobacco: Not on file  . Alcohol use No    No family history on file.  No Known Allergies  Objective: Vitals:   11/24/15 1007 11/24/15 1015  BP: (!) 179/100 (!) 163/93  Pulse: 92 88  Temp: 98.3 F (36.8 C)   TempSrc: Oral   SpO2: 96%   Weight: 157 lb 12 oz (71.6 kg)   Height: 5\' 4"  (1.626 m)    Body mass index is 27.08 kg/m.  Physical Exam  Constitutional: She is oriented to person, place,  and time.  She is in no distress. She speaks in a somewhat hoarse, whispered voice.  HENT:  Mouth/Throat: No oropharyngeal exudate.  Eyes: Conjunctivae are normal.  Cardiovascular: Normal rate and regular rhythm.   No murmur heard. Pulmonary/Chest: Effort normal. She has no wheezes. She has rales.  Abdominal: Soft. There is no tenderness.  Neurological: She is alert and oriented to person, place, and time.  Psychiatric: Mood and affect normal.    Lab Results    Problem List Items Addressed This Visit      High   Atypical mycobacterial infection of lung    We placed 2 phone calls today to Dr. Jason Fila office in [place]. The sputum specimen collected on 09/16/2015 was 3+ AFB positive on smear. The nurse could not locate an AFB culture result. I asked her to check with their lab to verify this. 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 The sputum specimen that was collected here at the time of her first visit 1 month ago was lost in transit to [place]. I will collect another specimen today. I will call her once I hear from Dr. Jason Fila office about the AFB culture. [name] will follow-up with me in 4 weeks. There will be some potential drug drug interactions between antibiotic therapy and her current medications that will need to be monitored.      Relevant Orders   AFB Culture & Smear    Other Visit Diagnoses   None.      Michel Bickers, MD [facility] for [facility] [phone] pager   [phone] cell 11/24/2015, 11:00 AM

## 2015-12-04 ENCOUNTER — Telehealth: Payer: Self-pay | Admitting: Internal Medicine

## 2015-12-04 ENCOUNTER — Other Ambulatory Visit: Payer: Self-pay | Admitting: Internal Medicine

## 2015-12-04 MED ORDER — AZITHROMYCIN 500 MG PO TABS: 500.0000 mg | ORAL_TABLET | ORAL | 11 refills | Status: DC

## 2015-12-04 MED ORDER — RIFAMPIN 300 MG PO CAPS: 300.0000 mg | ORAL_CAPSULE | ORAL | 11 refills | Status: DC

## 2015-12-04 MED ORDER — ETHAMBUTOL HCL 400 MG PO TABS: 1600.0000 mg | ORAL_TABLET | ORAL | 11 refills | Status: DC

## 2015-12-04 NOTE — Telephone Encounter (Signed)
Ms. Asel brought in a sputum sample and it is one plus positive for AFB on smear. Cultures are pending. The sputum sample done several months ago at home was also AFB positive on smear but no cultures were submitted. 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 I recommend starting:  Azithromycin 500 mg by mouth every Monday, Wednesday, Friday Ethambutol 1600 mg by mouth every Monday, Wednesday, Friday Rifampin 600 mg by mouth every Monday, Wednesday, Friday  I asked that she get an EKG done by her primary care provider at home before starting her medications to check her QT interval since azithromycin can impact this. This could be faxed to me at [phone]. I also warned her about letting me know if she has any changes in her hearing or vision.  I recommend that she stop leflunomide (Arava) for at least the next month. Theoretically it can suppress the immune system and make infections like Mycobacterium avium worse. Also it will interact with rifampin and accelerating conversion to the active metabolite which could give her increased side effects. I spoke to her rheumatologist, Dr. Reggy Eye, about this last week.  I have conveyed all of this information to her daughter, [name]. Ms. Llera has a follow-up appointment with me on 01/05/2016 at 3 PM.

## 2015-12-08 ENCOUNTER — Telehealth: Payer: Self-pay

## 2015-12-08 NOTE — Telephone Encounter (Signed)
[facility] lab calling report: Mycobacteria culture with flurochrome smear   Result:  Rare ( 1+) acid -fast bacilli seen using flurochrome method acid-fast bacilli present in liquid culture media .  Requested copy to be faxed to office.   Results called to Dr Megan Salon who was aware of result.   Dr Megan Salon  would like patient to have an EKG prior to starting medications.   Patient stated  her primary care physician has refused to perform EKG and suggested she have it done at our office.   I spoke with Dr Melodie Bouillon and he stated patient should have EKG done in [place] office since she has to come here for the medication.  I will contact patient's daughter for alternate facility to have EKG performed.   Laverle Patter, RN

## 2015-12-14 ENCOUNTER — Telehealth: Payer: Self-pay | Admitting: *Deleted

## 2015-12-14 NOTE — Telephone Encounter (Signed)
[facility] Sputum Culture, 1+ AFB culture, +MAC.  Dr. Megan Salon, please see preliminary report in EPIC from today, 12/14/15.  Telephone call attempted, MD on the phone.

## 2015-12-14 NOTE — Telephone Encounter (Signed)
Rare (1 +) acid-fast bacilli seen using the            fluorochrome method.   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

## 2015-12-17 ENCOUNTER — Telehealth: Payer: Self-pay | Admitting: *Deleted

## 2015-12-17 NOTE — Telephone Encounter (Signed)
[facility] calling to report critical lab value - culture smear positive for MAC. Smear sent for sensitivities per protocol. Landis Gandy, RN

## 2015-12-24 ENCOUNTER — Encounter: Payer: Self-pay | Admitting: Internal Medicine

## 2015-12-24 ENCOUNTER — Ambulatory Visit (INDEPENDENT_AMBULATORY_CARE_PROVIDER_SITE_OTHER): Payer: Medicare Other | Admitting: Internal Medicine

## 2015-12-24 DIAGNOSIS — A310 Pulmonary mycobacterial infection: Secondary | ICD-10-CM

## 2015-12-24 NOTE — Progress Notes (Signed)
[facility] for Infectious Disease  Patient Active Problem List   Diagnosis Date Noted  . 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 Borderline diabetes   . Rheumatoid arthritis ([place]) 10/29/2015  . DJD (degenerative joint disease) 10/29/2015  . Osteoporosis 10/29/2015  . Osteoarthritis of right hip 11/21/2014  . 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   ASPIRIN EC 81 MG TABLET    Take 81 mg by mouth daily.   AZITHROMYCIN (ZITHROMAX) 500 MG TABLET    Take 1 tablet (500 mg total) by mouth every Monday, Wednesday, and Friday.   BRIMONIDINE (ALPHAGAN) 0.2 % OPHTHALMIC SOLUTION       CALCIUM CITRATE-VITAMIN D (CALCIUM + D PO)    Take 1 tablet by mouth daily.   CELECOXIB (CELEBREX) 100 MG CAPSULE    Take 100 mg by mouth daily as needed (1-2 tablets per day as needed).    DORZOLAMIDE-TIMOLOL (COSOPT) 22.3-6.8 MG/ML OPHTHALMIC SOLUTION    Place 1 drop into both eyes 2 (two) times daily.   ETHAMBUTOL (MYAMBUTOL) 400 MG TABLET    Take 4 tablets (1,600 mg total) by mouth every Monday, Wednesday, and Friday.   FLUTICASONE-SALMETEROL (ADVAIR) 100-50 MCG/DOSE AEPB    Inhale 1 puff into the lungs daily. ONCE DAILY   HYDROCHLOROTHIAZIDE (HYDRODIURIL) 50 MG TABLET    Take 50 mg by mouth daily.   IBANDRONATE (BONIVA) 150 MG TABLET    Take 150 mg by mouth every 30 (thirty) days. Take in the morning with a full glass of water, on an empty stomach, and do not take anything else by mouth or lie down for the next 30 min.   LATANOPROST (XALATAN) 0.005 % OPHTHALMIC SOLUTION    Place 1 drop into both eyes at bedtime.   NIFEDIPINE (PROCARDIA-XL/ADALAT-CC/NIFEDICAL-XL) 30 MG 24 HR TABLET     Take 60 mg by mouth every morning.    POTASSIUM CHLORIDE (KLOR-CON) 20 MEQ PACKET    Take 20 mEq by mouth daily.   RIFAMPIN (RIFADIN) 300 MG CAPSULE    Take 1 capsule (300 mg total) by mouth every Monday, Wednesday, and Friday.   ROSUVASTATIN (CRESTOR) 20 MG TABLET    Take 20 mg by mouth daily.  Modified Medications   No medications on file  Discontinued Medications   HYDROCHLOROTHIAZIDE (HYDRODIURIL) 25 MG TABLET    Take 25 mg by mouth every morning.    Subjective: Ms. Edgeworth is in for her routine follow-up visit. She is accompanied by her daughter, [name]. Ms. Ruppenthal started on azithromycin and ethambutol and rifampin around 12/09/2015. Subsequently her sputum cultures did grow Mycobacterium avium. She is tolerating her antibiotics well and she is feeling much better. Her dyspnea on exertion is much better and she is wheezing much less. She is coughing much less and bringing up less sputum. She has not had any fever, chills or sweats. Her anorexia is unchanged. She is not having any nausea, vomiting or abdominal pain. She did have some diarrhea right after she started her antibiotics but that has gotten much better. She did stop her leflunomide after her last visit. She has not noticed any  flare of her rheumatoid arthritis symptoms.  Review of Systems: Review of Systems  Constitutional: Negative for chills, diaphoresis, fever, malaise/fatigue and weight loss.  HENT: Negative for sore throat.   Respiratory: Negative for cough, sputum production and shortness of breath.   Cardiovascular: Negative for chest pain.  Gastrointestinal: Positive for diarrhea. Negative for abdominal pain, heartburn, nausea and vomiting.  Musculoskeletal: Negative for joint pain and myalgias.  Skin: Negative for rash.  Neurological: Negative for headaches.    Past Medical History:  Diagnosis Date  . Arthritis   . Asthma   . Borderline diabetes   . Complication of anesthesia    "I SLEEP A VERY VERY  LONG TIME"  . Eczema   . Environmental allergies   . Glaucoma   . History of transfusion   . Hyperlipidemia   . Hypertension   . Tingling    OF FEET    Social History  Substance Use Topics  . Smoking status: Never Smoker  . Smokeless tobacco: Not on file  . Alcohol use No    No family history on file.  No Known Allergies  Objective: Vitals:   12/24/15 1546  BP: (!) 166/100  Pulse: (!) 105  Temp: 98.3 F (36.8 C)  TempSrc: Oral  SpO2: 98%  Weight: 159 lb 12 oz (72.5 kg)   Body mass index is 27.42 kg/m.  Physical Exam  Constitutional: She is oriented to person, place, and time.  She is smiling and in good spirits. She looks much improved. Her hoarseness has resolved.  HENT:  Mouth/Throat: No oropharyngeal exudate.  She has one small white spot on her hard palate just to the left of midline under her dentures. It is nontender.  Cardiovascular: Normal rate and regular rhythm.   No murmur heard. Pulmonary/Chest: Effort normal. She has no wheezes. She has rales.  Abdominal: Soft. There is no tenderness.  Musculoskeletal: Normal range of motion. She exhibits no edema or tenderness.  Neurological: She is alert and oriented to person, place, and time.  Skin: No rash noted.  Psychiatric: Mood and affect normal.      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 She is tolerating her antibiotic regimen well. So far she has had no evidence of a flare of her rheumatoid arthritis since stopping leflunomide. She will continue her current antibiotic regimen and follow-up with me in 2 months. Will obtain a repeat sputum culture at that time if she is able to produce sputum.      Relevant Orders   AFB Culture & Smear    Other Visit Diagnoses   None.      Michel Bickers, MD [facility] [phone] pager   [phone] cell 12/24/2015, 5:01 PM

## 2015-12-24 NOTE — Assessment & Plan Note (Signed)
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 She is tolerating her antibiotic regimen well. So far she has had no evidence of a flare of her rheumatoid arthritis since stopping leflunomide. She will continue her current antibiotic regimen and follow-up with me in 2 months. Will obtain a repeat sputum culture at that time if she is able to produce sputum.

## 2015-12-25 ENCOUNTER — Telehealth: Payer: Self-pay | Admitting: *Deleted

## 2015-12-25 NOTE — Telephone Encounter (Signed)
Shavone with [facility] called to advise that the AFB culture submitted 11/24/15 has new results. The AFB probe is positive for MAC and there is another mycobacterium organism that is unidentified and being sent for further testing. The report is in the system . I advised will send to the doctor so that he can review.

## 2015-12-30 ENCOUNTER — Encounter: Payer: Self-pay | Admitting: Internal Medicine

## 2016-01-05 ENCOUNTER — Ambulatory Visit: Payer: Medicare Other | Admitting: Internal Medicine

## 2016-01-07 LAB — AFB CULTURE WITH SMEAR (NOT AT ARMC)

## 2016-01-08 ENCOUNTER — Other Ambulatory Visit: Payer: Medicare Other

## 2016-01-08 DIAGNOSIS — A310 Pulmonary mycobacterial infection: Secondary | ICD-10-CM

## 2016-02-19 ENCOUNTER — Telehealth: Payer: Self-pay | Admitting: *Deleted

## 2016-02-19 NOTE — Telephone Encounter (Signed)
Call from [facility] lab with a critical sputum showing AFB and Micro Gordonae. Results given to [facility] pharmacist and note routed to Dr. Megan Salon and Rubin Payor. Myrtis Hopping CMA

## 2016-02-22 NOTE — Telephone Encounter (Signed)
She is improving on therapy for Mycobacterium avium. 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 I will not make any changes in therapy.

## 2016-03-08 ENCOUNTER — Ambulatory Visit (INDEPENDENT_AMBULATORY_CARE_PROVIDER_SITE_OTHER): Payer: Medicare Other | Admitting: Internal Medicine

## 2016-03-08 ENCOUNTER — Ambulatory Visit (INDEPENDENT_AMBULATORY_CARE_PROVIDER_SITE_OTHER): Payer: Medicare Other | Admitting: Physician Assistant

## 2016-03-08 DIAGNOSIS — A310 Pulmonary mycobacterial infection: Secondary | ICD-10-CM | POA: Diagnosis not present

## 2016-03-08 DIAGNOSIS — G629 Polyneuropathy, unspecified: Secondary | ICD-10-CM | POA: Insufficient documentation

## 2016-03-08 DIAGNOSIS — M069 Rheumatoid arthritis, unspecified: Secondary | ICD-10-CM | POA: Diagnosis not present

## 2016-03-08 DIAGNOSIS — Z96641 Presence of right artificial hip joint: Secondary | ICD-10-CM

## 2016-03-08 DIAGNOSIS — G6289 Other specified polyneuropathies: Secondary | ICD-10-CM | POA: Diagnosis not present

## 2016-03-08 LAB — CBC
HEMATOCRIT: 38.6 % (ref 35.0–45.0)
HEMOGLOBIN: 12.5 g/dL (ref 11.7–15.5)
MCH: 28.8 pg (ref 27.0–33.0)
MCHC: 32.4 g/dL (ref 32.0–36.0)
MCV: 88.9 fL (ref 80.0–100.0)
MPV: 9.9 fL (ref 7.5–12.5)
Platelets: 252 10*3/uL (ref 140–400)
RBC: 4.34 MIL/uL (ref 3.80–5.10)
RDW: 14.7 % (ref 11.0–15.0)
WBC: 4.1 10*3/uL (ref 3.8–10.8)

## 2016-03-08 MED ORDER — CELECOXIB 100 MG PO CAPS: 100.0000 mg | ORAL_CAPSULE | Freq: Every day | ORAL | 3 refills | Status: DC | PRN

## 2016-03-08 NOTE — Assessment & Plan Note (Signed)
She has had an excellent clinical response to 3 drug therapy for Mycobacterium avium pneumonia. She will stay on her current regimen. I will obtain blood work today and see her back in 2 months.

## 2016-03-08 NOTE — Assessment & Plan Note (Signed)
Her nocturnal numbness and burning new sound like peripheral neuropathy symptoms. She may get some relief with Celebrex. If not it may be worth trying pregabalin or amitriptyline at bedtime. I do not think this has any relationship to her Mycobacterium avium infection.

## 2016-03-08 NOTE — Progress Notes (Signed)
[facility] for Infectious Disease  Patient Active Problem List   Diagnosis Date Noted  . 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 Peripheral neuropathy ([place]) 03/08/2016  . Borderline diabetes   . Rheumatoid arthritis ([place]) 10/29/2015  . DJD (degenerative joint disease) 10/29/2015  . Osteoporosis 10/29/2015  . Osteoarthritis of right hip 11/21/2014  . 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   ASPIRIN EC 81 MG TABLET    Take 81 mg by mouth daily.   AZITHROMYCIN (ZITHROMAX) 500 MG TABLET    Take 1 tablet (500 mg total) by mouth every Monday, Wednesday, and Friday.   BRIMONIDINE (ALPHAGAN) 0.2 % OPHTHALMIC SOLUTION       CALCIUM CITRATE-VITAMIN D (CALCIUM + D PO)    Take 1 tablet by mouth daily.   CELECOXIB (CELEBREX) 100 MG CAPSULE    Take 1 capsule (100 mg total) by mouth daily as needed (1-2 tablets per day as needed).   DORZOLAMIDE-TIMOLOL (COSOPT) 22.3-6.8 MG/ML OPHTHALMIC SOLUTION    Place 1 drop into both eyes 2 (two) times daily.   ETHAMBUTOL (MYAMBUTOL) 400 MG TABLET    Take 4 tablets (1,600 mg total) by mouth every Monday, Wednesday, and Friday.   FLUTICASONE-SALMETEROL (ADVAIR) 100-50 MCG/DOSE AEPB    Inhale 1 puff into the lungs daily. ONCE DAILY   HYDROCHLOROTHIAZIDE (HYDRODIURIL) 50 MG TABLET    Take 50 mg by mouth daily.   IBANDRONATE (BONIVA) 150 MG TABLET    Take 150 mg by mouth every 30 (thirty) days. Take in the morning with a full glass of water, on an empty stomach, and do not take anything else by mouth or lie down for the next 30 min.   LATANOPROST (XALATAN) 0.005 % OPHTHALMIC SOLUTION    Place 1 drop into both eyes at bedtime.   NIFEDIPINE (PROCARDIA-XL/ADALAT-CC/NIFEDICAL-XL) 30 MG 24 HR TABLET    Take 60 mg by mouth every morning.    POTASSIUM CHLORIDE (KLOR-CON) 20 MEQ PACKET    Take 20 mEq by mouth daily.   RIFAMPIN (RIFADIN) 300 MG CAPSULE    Take 1 capsule (300 mg total) by mouth every Monday, Wednesday, and Friday.   ROSUVASTATIN (CRESTOR) 20 MG TABLET    Take 20 mg by mouth daily.  Modified Medications   No medications on file  Discontinued Medications   No medications on file    Subjective: Teresa French is in for her routine follow-up visit. She is now been on azithromycin, ethambutol and rifampin for 3 months for her Mycobacterium avium pneumonia. She will occasionally have mild nausea without vomiting and occasional diarrhea. This generally occurs when she takes her dose on Monday morning after being off over the weekend. Overall she feels that she is tolerating the treatment fairly well. She is feeling much better with regards to her respiratory symptoms. Her cough has improved greatly and she is not bringing up any phlegm. She is not having dyspnea on exertion. She has not had any wheezing recently and has not needed to use her inhalers.  She is having more problem with pain, stiffness and swelling of her joints, especially in her hands.  She has been off of Celebrex for several months but had a new prescription called in today and plans to restart it. She has been off of her [facility] since starting therapy for Mycobacterium avium.  She tells me that she has had problems with burning and numbness in her feet for the past year. She says that it began around the same time that she started having problems with coughing, shortness of breath and wheezing. She wonders if it is related to her Mycobacterium avium infection. The numbness and burning bothers her most at night when she is trying to sleep. She sleeps with socks on and occasionally will use a heating pad to help. She had tried gabapentin but could not tolerate the  side effects.  Review of Systems: Review of Systems  Constitutional: Positive for malaise/fatigue. Negative for chills, diaphoresis, fever and weight loss.  HENT: Negative for hearing loss and sore throat.   Eyes:       There has been no change in her vision  Respiratory: Positive for cough. Negative for sputum production, shortness of breath and wheezing.   Cardiovascular: Negative for chest pain.  Gastrointestinal: Positive for diarrhea and nausea. Negative for abdominal pain, heartburn and vomiting.  Genitourinary: Negative for dysuria and frequency.  Musculoskeletal: Positive for joint pain and myalgias.  Skin: Negative for rash.  Neurological: Positive for sensory change. Negative for dizziness and headaches.       As noted in history of present illness.  Psychiatric/Behavioral: Negative for depression. The patient is not nervous/anxious.     Past Medical History:  Diagnosis Date  . Arthritis   . Asthma   . Borderline diabetes   . Complication of anesthesia    "I SLEEP A VERY VERY LONG TIME"  . Eczema   . Environmental allergies   . Glaucoma   . History of transfusion   . Hyperlipidemia   . Hypertension   . Tingling    OF FEET    Social History  Substance Use Topics  . Smoking status: Never Smoker  . Smokeless tobacco: Not on file  . Alcohol use No    No family history on file.  No Known Allergies  Objective: Vitals:   03/08/16 1552  BP: 127/69  Pulse: (!) 107  Temp: 98.3 F (36.8 C)  TempSrc: Oral  Weight: 167 lb (75.8 kg)   Body mass index is 28.67 kg/m.  Physical Exam  Constitutional: She is oriented to person, place, and time.  She is smiling and in good spirits. She's gained 16 pounds in the past few months  HENT:  Mouth/Throat: No oropharyngeal exudate.  Eyes: Conjunctivae are normal.  Cardiovascular: Normal rate and regular rhythm.   No murmur heard. Pulmonary/Chest: Effort normal and breath sounds normal. She has no wheezes. She has no  rales.  Abdominal: Soft. She exhibits no mass. There is no tenderness.  Musculoskeletal:  She has some diffuse swelling of her PIP joints bilaterally and swelling and tenderness of her right wrist.  She has strong dorsalis pedis pulses bilaterally.  Neurological: She is alert and oriented to person, place, and time. Gait normal.  Skin: No rash noted.  Psychiatric: Mood and affect normal.    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 She will stay on her current regimen.  I will obtain blood work today and see her back in 2 months.      Relevant Orders   CBC   Comprehensive metabolic panel     Unprioritized   Peripheral neuropathy ([facility])    Her nocturnal numbness and burning new sound like peripheral neuropathy symptoms. She may get some relief with Celebrex. If not it may be worth trying pregabalin or amitriptyline at bedtime. I do not think this has any relationship to her Mycobacterium avium infection.      Rheumatoid arthritis ([place])    It appears that her rheumatoid arthritis is beginning to flare up after being off of Celebrex and [place] recently. Her Jolee Ewing was stopped when her Mycobacterium avium pneumonia was diagnosed. I believe it is safe and reasonable to restart it now that she has been on Mycobacterium avium therapy for 3 months with a good clinical response.          Michel Bickers, MD [facility] for Infectious [facility] [phone] pager   [phone] cell 03/08/2016, 4:40 PM

## 2016-03-08 NOTE — Progress Notes (Signed)
Office Visit Note   Patient: Teresa French           Date of Birth: [birth year]           MRN: [id number] Visit Date: 03/08/2016              Requested by: Louellen Molder, MD [place], [place] [address] PCP: AMAEFULE,CELESTINE I, MD   Assessment & Plan: Visit Diagnoses:  1. Status post total replacement of right hip     Plan: We will see her back on a when necessary basis if she has any questions or concerns  Follow-Up Instructions: Return if symptoms worsen or fail to improve.   Orders:  No orders of the defined types were placed in this encounter.  No orders of the defined types were placed in this encounter.     Procedures: No procedures performed   Clinical Data: No additional findings.   Subjective: Chief Complaint  Patient presents with  . Right Hip - Follow-up    [name] is here for 6 month follow up on Total right hip arthroplasty on 11/21/2014.  States that she feels like there is some arthritis in the hip and the area in sensative.    HPI Patient states that her rheumatoid arthritis is bothering her unfortunately she to come off of her rheumatologic complications due to an atypical Mycobacterium infection WHICH is treated for by Dr. Megan Salon. She said currently having daily multiple arthralgias and is requesting Celebrex to help with these Review of Systems   Objective: Vital Signs: There were no vitals taken for this visit.  Physical Exam  Constitutional: She is oriented to person, place, and time. She appears well-developed and well-nourished. No distress.  Pulmonary/Chest: Effort normal.  Neurological: She is alert and oriented to person, place, and time.  Skin: Skin is warm and dry.  Psychiatric: She has a normal mood and affect.    Ortho Exam Good range of motion with both hips without pain. Specialty Comments:  No specialty comments available.  Imaging: No results found.   PMFS History: Patient Active Problem  List   Diagnosis Date Noted  . Borderline diabetes   . Rheumatoid arthritis ([place]) 10/29/2015  . DJD (degenerative joint disease) 10/29/2015  . Osteoporosis 10/29/2015  . 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 Osteoarthritis of right hip 11/21/2014  . 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 Arthritis   . Asthma   . Borderline diabetes   . Complication of anesthesia    "I SLEEP A VERY VERY LONG TIME"  . Eczema   . Environmental allergies   . Glaucoma   . History of transfusion   . Hyperlipidemia   . Hypertension   . Tingling    OF FEET    No family history on file.  Past Surgical History:  Procedure Laterality Date  . BREAST BIOPSY     3-5 EACH BREAST  . CERVICAL LAMINECTOMY    . DILATION AND CURETTAGE OF UTERUS    . GANGLION CYST EXCISION     X3  . JOINT REPLACEMENT  2007   left hip  . TOTAL HIP ARTHROPLASTY Right 11/21/2014   Procedure: RIGHT TOTAL HIP ARTHROPLASTY ANTERIOR APPROACH;  Surgeon: Mcarthur Rossetti, MD;  Location: [facility];  Service: Orthopedics;  Laterality: Right;   Social History   Occupational History  . Not on file.   Social History Main Topics  .  Smoking status: Never Smoker  . Smokeless tobacco: Not on file  . Alcohol use No  . Drug use: No  . Sexual activity: Not on file

## 2016-03-08 NOTE — Assessment & Plan Note (Signed)
It appears that her rheumatoid arthritis is beginning to flare up after being off of Celebrex and [place] recently. Her Jolee Ewing was stopped when her Mycobacterium avium pneumonia was diagnosed. I believe it is safe and reasonable to restart it now that she has been on Mycobacterium avium therapy for 3 months with a good clinical response.

## 2016-03-09 LAB — COMPREHENSIVE METABOLIC PANEL
ALBUMIN: 3.9 g/dL (ref 3.6–5.1)
ALK PHOS: 55 U/L (ref 33–130)
ALT: 10 U/L (ref 6–29)
AST: 18 U/L (ref 10–35)
BILIRUBIN TOTAL: 0.3 mg/dL (ref 0.2–1.2)
BUN: 11 mg/dL (ref 7–25)
CALCIUM: 9.3 mg/dL (ref 8.6–10.4)
CO2: 27 mmol/L (ref 20–31)
Chloride: 103 mmol/L (ref 98–110)
Creat: 0.9 mg/dL (ref 0.60–0.93)
Glucose, Bld: 115 mg/dL — ABNORMAL HIGH (ref 65–99)
POTASSIUM: 3.7 mmol/L (ref 3.5–5.3)
Sodium: 140 mmol/L (ref 135–146)
TOTAL PROTEIN: 6.7 g/dL (ref 6.1–8.1)

## 2016-03-17 ENCOUNTER — Ambulatory Visit (INDEPENDENT_AMBULATORY_CARE_PROVIDER_SITE_OTHER): Payer: Medicare Other | Admitting: Orthopaedic Surgery

## 2016-03-22 LAB — AFB CULTURE WITH SMEAR (NOT AT ARMC)

## 2016-05-10 ENCOUNTER — Encounter: Payer: Self-pay | Admitting: Internal Medicine

## 2016-05-10 ENCOUNTER — Ambulatory Visit (INDEPENDENT_AMBULATORY_CARE_PROVIDER_SITE_OTHER): Payer: Medicare Other | Admitting: Internal Medicine

## 2016-05-10 DIAGNOSIS — A310 Pulmonary mycobacterial infection: Secondary | ICD-10-CM

## 2016-05-10 NOTE — Progress Notes (Signed)
[facility] for Infectious Disease  Patient Active Problem List   Diagnosis Date Noted  . 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 Peripheral neuropathy ([place]) 03/08/2016  . Borderline diabetes   . Rheumatoid arthritis ([place]) 10/29/2015  . DJD (degenerative joint disease) 10/29/2015  . Osteoporosis 10/29/2015  . Osteoarthritis of right hip 11/21/2014  . 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   ASPIRIN EC 81 MG TABLET    Take 81 mg by mouth daily.   AZITHROMYCIN (ZITHROMAX) 500 MG TABLET    Take 1 tablet (500 mg total) by mouth every Monday, Wednesday, and Friday.   BRIMONIDINE (ALPHAGAN) 0.2 % OPHTHALMIC SOLUTION       CALCIUM CITRATE-VITAMIN D (CALCIUM + D PO)    Take 1 tablet by mouth daily.   CELECOXIB (CELEBREX) 100 MG CAPSULE    Take 1 capsule (100 mg total) by mouth daily as needed (1-2 tablets per day as needed).   DORZOLAMIDE-TIMOLOL (COSOPT) 22.3-6.8 MG/ML OPHTHALMIC SOLUTION    Place 1 drop into both eyes 2 (two) times daily.   ETHAMBUTOL (MYAMBUTOL) 400 MG TABLET    Take 4 tablets (1,600 mg total) by mouth every Monday, Wednesday, and Friday.   FLUTICASONE-SALMETEROL (ADVAIR) 100-50 MCG/DOSE AEPB    Inhale 1 puff into the lungs daily. ONCE DAILY   HYDROCHLOROTHIAZIDE (HYDRODIURIL) 50 MG TABLET    Take 50 mg by mouth daily.   IBANDRONATE (BONIVA) 150 MG TABLET    Take 150 mg by mouth every 30 (thirty) days. Take in the morning with a full glass of water, on an empty stomach, and do not take anything else by mouth or lie down for the next 30 min.   LATANOPROST (XALATAN) 0.005 % OPHTHALMIC SOLUTION    Place 1 drop into both eyes at bedtime.   NIFEDIPINE (PROCARDIA-XL/ADALAT-CC/NIFEDICAL-XL) 30 MG 24 HR TABLET    Take 60 mg by mouth every morning.    POTASSIUM CHLORIDE (KLOR-CON) 20 MEQ PACKET    Take 20 mEq by mouth daily.   RIFAMPIN (RIFADIN) 300 MG CAPSULE    Take 1 capsule (300 mg total) by mouth every Monday, Wednesday, and Friday.   ROSUVASTATIN (CRESTOR) 20 MG TABLET    Take 20 mg by mouth daily.  Modified Medications   No medications on file  Discontinued Medications   No medications on file    Subjective: Teresa French is in for her routine follow-up visit. 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 She takes her antibiotics every Monday, Wednesday and Friday. She will occasionally note some very mild nausea but has not had any vomiting. She states that her appetite may be a little bit less but she is not losing any weight. She notes marked improvement in her dyspnea on exertion, wheezing and cough. She started back on her [place] about 3 weeks ago but has not noted any improvement in the pain and stiffness in her wrist yet. She takes her Celebrex as needed. Yesterday she noted some new onset of left-sided abdominal pain. She has had intermittent episodes since that time. She does  not know anything that causes the pain to come on or get worse. She has not tried taking anything for the pain. She has not had any nausea, vomiting, diarrhea or constipation recently.  Review of Systems: Review of Systems  Constitutional: Negative for chills, diaphoresis, fever, malaise/fatigue and weight loss.  HENT: Negative for sore throat.   Respiratory: Positive for shortness of breath. Negative for cough and sputum production.   Cardiovascular: Negative for chest pain.  Gastrointestinal: Positive for abdominal pain. Negative for diarrhea, heartburn, nausea and vomiting.  Genitourinary: Negative for dysuria and frequency.  Musculoskeletal: Positive for joint pain. Negative for  myalgias.  Skin: Negative for rash.  Neurological: Negative for dizziness and headaches.  Psychiatric/Behavioral: Negative for depression and substance abuse. The patient is not nervous/anxious.     Past Medical History:  Diagnosis Date  . Arthritis   . Asthma   . Borderline diabetes   . Complication of anesthesia    "I SLEEP A VERY VERY LONG TIME"  . Eczema   . Environmental allergies   . Glaucoma   . History of transfusion   . Hyperlipidemia   . Hypertension   . Tingling    OF FEET    Social History  Substance Use Topics  . Smoking status: Never Smoker  . Smokeless tobacco: Never Used  . Alcohol use No    No family history on file.  No Known Allergies  Objective: Vitals:   05/10/16 0946  BP: (!) 161/92  Pulse: 82  Temp: 97.8 F (36.6 C)  TempSrc: Oral  Weight: 167 lb (75.8 kg)  Height: 5\' 4"  (1.626 m)   Body mass index is 28.67 kg/m.  Physical Exam  Constitutional: She is oriented to person, place, and time.  She is in good spirits.  HENT:  Mouth/Throat: No oropharyngeal exudate.  Eyes: Conjunctivae are normal.  Cardiovascular: Normal rate and regular rhythm.   No murmur heard. Pulmonary/Chest: Effort normal and breath sounds normal. She has no wheezes. She has no rales.  Abdominal: Soft. Bowel sounds are normal. She exhibits no mass. There is tenderness. There is no rebound and no guarding.  Mild left-sided abdominal tenderness.  Musculoskeletal: Normal range of motion.  Mild swelling of her right wrist and knuckles bilaterally.  Neurological: She is alert and oriented to person, place, and time.  Skin: No rash noted.  Psychiatric: Mood and affect normal.    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 She is tolerating her antibiotics well. She had a  normal CBC on 04/20/2016. Her CMP was normal except for a slightly low potassium of 3.4. I doubt that her recent abdominal pain is due to her medication but her asked her to call me if she is not seeing improvement in the pain soon. She will follow-up in 3 months.          Teresa Bickers, MD [facility] [phone] pager   [phone] cell 05/10/2016, 10:09 AM

## 2016-05-10 NOTE — Assessment & Plan Note (Signed)
She has had marked improvement in her respiratory symptoms after 5 months of therapy for Mycobacterium avium pneumonia. She is tolerating her antibiotics well. She had a normal CBC on 04/20/2016. Her CMP was normal except for a slightly low potassium of 3.4. I doubt that her recent abdominal pain is due to her medication but her asked her to call me if she is not seeing improvement in the pain soon. She will follow-up in 3 months.

## 2016-08-09 ENCOUNTER — Ambulatory Visit: Payer: Medicare Other | Admitting: Internal Medicine

## 2016-08-18 ENCOUNTER — Encounter: Payer: Self-pay | Admitting: Internal Medicine

## 2016-08-18 ENCOUNTER — Ambulatory Visit (INDEPENDENT_AMBULATORY_CARE_PROVIDER_SITE_OTHER): Payer: Medicare Other | Admitting: Internal Medicine

## 2016-08-18 DIAGNOSIS — A310 Pulmonary mycobacterial infection: Secondary | ICD-10-CM

## 2016-08-18 DIAGNOSIS — R110 Nausea: Secondary | ICD-10-CM

## 2016-08-18 MED ORDER — ONDANSETRON HCL 4 MG PO TABS: 4.0000 mg | ORAL_TABLET | Freq: Three times a day (TID) | ORAL | 3 refills | Status: DC | PRN

## 2016-08-18 NOTE — Progress Notes (Signed)
[facility] for Infectious Disease  Patient Active Problem List   Diagnosis Date Noted  . 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 Nausea 08/18/2016  . Peripheral neuropathy 03/08/2016  . Borderline diabetes   . Rheumatoid arthritis ([place]) 10/29/2015  . DJD (degenerative joint disease) 10/29/2015  . Osteoporosis 10/29/2015  . Osteoarthritis of right hip 11/21/2014  . 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  Previous Medications   ALBUTEROL (PROVENTIL HFA;VENTOLIN HFA) 108 (90 BASE) MCG/ACT INHALER    Inhale 2 puffs into the lungs every 4 (four) hours as needed for wheezing or shortness of breath.   ASPIRIN EC 81 MG TABLET    Take 81 mg by mouth daily.   AZITHROMYCIN (ZITHROMAX) 500 MG TABLET    Take 1 tablet (500 mg total) by mouth every Monday, Wednesday, and Friday.   BRIMONIDINE (ALPHAGAN) 0.2 % OPHTHALMIC SOLUTION       CALCIUM CITRATE-VITAMIN D (CALCIUM + D PO)    Take 1 tablet by mouth daily.   CELECOXIB (CELEBREX) 100 MG CAPSULE    Take 1 capsule (100 mg total) by mouth daily as needed (1-2 tablets per day as needed).   DORZOLAMIDE-TIMOLOL (COSOPT) 22.3-6.8 MG/ML OPHTHALMIC SOLUTION    Place 1 drop into both eyes 2 (two) times daily.   ETHAMBUTOL (MYAMBUTOL) 400 MG TABLET    Take 4 tablets (1,600 mg total) by mouth every Monday, Wednesday, and Friday.   FLUTICASONE-SALMETEROL (ADVAIR) 100-50 MCG/DOSE AEPB    Inhale 1 puff into the lungs daily. ONCE DAILY   HYDROCHLOROTHIAZIDE (HYDRODIURIL) 50 MG TABLET    Take 50 mg by mouth daily.   IBANDRONATE (BONIVA) 150 MG TABLET    Take 150 mg by mouth every 30 (thirty) days. Take in the morning with a full glass of water, on an empty stomach, and do not take anything else by mouth or lie down for the  next 30 min.   LATANOPROST (XALATAN) 0.005 % OPHTHALMIC SOLUTION    Place 1 drop into both eyes at bedtime.   NIFEDIPINE (PROCARDIA-XL/ADALAT-CC/NIFEDICAL-XL) 30 MG 24 HR TABLET    Take 60 mg by mouth every morning.    POTASSIUM CHLORIDE (KLOR-CON) 20 MEQ PACKET    Take 20 mEq by mouth daily.   RIFAMPIN (RIFADIN) 300 MG CAPSULE    Take 1 capsule (300 mg total) by mouth every Monday, Wednesday, and Friday.   ROSUVASTATIN (CRESTOR) 20 MG TABLET    Take 20 mg by mouth daily.  Modified Medications   No medications on file  Discontinued Medications   No medications on file    Subjective: [name] is in for her routine follow-up visit. She is now completed a little over 8 months of azithromycin, ethambutol and rifampin every Monday, Wednesday and Friday for Mycobacterium avium pneumonia. She continues to have some malaise and nausea after taking her antibiotics. She takes them about midday over the course of about one hour. She has not had any vomiting. Overall her cough, shortness of breath and wheezing are much improved.  Several months ago she did start back on array value and her rheumatoid arthritis pain has improved. She still has some numbness and tingling in her feet. She was  started on some new medication recently and was supposed to slowly increase it from one a day to 3 times daily. It caused some stomach upset and she stopped taking it after about 2 weeks. Her sister had a stroke last fall and an older brother was killed in a motor vehicle accident in February.  Review of Systems: Review of Systems  Constitutional: Positive for malaise/fatigue. Negative for chills, diaphoresis, fever and weight loss.  HENT: Negative for sore throat.   Respiratory: Positive for cough and shortness of breath. Negative for sputum production and wheezing.   Cardiovascular: Negative for chest pain.  Gastrointestinal: Positive for nausea. Negative for abdominal pain, diarrhea, heartburn and vomiting.    Genitourinary: Negative for dysuria.  Musculoskeletal: Positive for joint pain.  Skin: Negative for rash.  Neurological: Positive for tingling and sensory change. Negative for headaches.  Psychiatric/Behavioral: Negative for depression.    Past Medical History:  Diagnosis Date  . Arthritis   . Asthma   . Borderline diabetes   . Complication of anesthesia    "I SLEEP A VERY VERY LONG TIME"  . Eczema   . Environmental allergies   . Glaucoma   . History of transfusion   . Hyperlipidemia   . Hypertension   . Tingling    OF FEET    Social History  Substance Use Topics  . Smoking status: Never Smoker  . Smokeless tobacco: Never Used  . Alcohol use No    No family history on file.  No Known Allergies  Objective: Vitals:   08/18/16 0849  BP: (!) 188/75  Pulse: 98  Temp: 97.5 F (36.4 C)  TempSrc: Oral  Weight: 164 lb (74.4 kg)   Body mass index is 28.15 kg/m.  Physical Exam  Constitutional: She is oriented to person, place, and time. No distress.  HENT:  Mouth/Throat: No oropharyngeal exudate.  Eyes: Conjunctivae are normal.  Cardiovascular: Normal rate and regular rhythm.   No murmur heard. Pulmonary/Chest: Effort normal and breath sounds normal. She has no wheezes. She has no rales.  Abdominal: Soft. There is no tenderness.  Musculoskeletal: Normal range of motion. She exhibits no edema or tenderness.  Neurological: She is alert and oriented to person, place, and time. Gait normal.  Skin: No rash noted.  Psychiatric: Mood and affect normal.    Lab Results    Problem List Items Addressed This Visit      Unprioritized   Nausea   Relevant Medications   ondansetron (ZOFRAN) 4 MG tablet       Michel Bickers, MD [facility] [phone] pager   [phone] cell 08/18/2016, 9:53 AM

## 2016-08-18 NOTE — Assessment & Plan Note (Signed)
Her Mycobacterium avium pneumonia is improving on 3 drug therapy. I instructed her to take ondansetron 30 minutes before her antibiotics and to change her antibiotics to just before bedtime to see if she tolerates it better. She will follow-up in 3 months.

## 2016-08-23 ENCOUNTER — Telehealth: Payer: Self-pay | Admitting: Internal Medicine

## 2016-08-23 ENCOUNTER — Other Ambulatory Visit: Payer: Self-pay | Admitting: Internal Medicine

## 2016-08-23 DIAGNOSIS — G603 Idiopathic progressive neuropathy: Secondary | ICD-10-CM

## 2016-08-23 MED ORDER — PREGABALIN 75 MG PO CAPS: 75.0000 mg | ORAL_CAPSULE | Freq: Two times a day (BID) | ORAL | 5 refills | Status: DC

## 2016-08-23 NOTE — Telephone Encounter (Signed)
Teresa French called me this morning to let me know that she had tried gabapentin a few months ago for her peripheral neuropathy but it caused some stomach upset. She would like to try pregabalin. I will start her on pregabalin 75 mg twice daily.

## 2016-10-13 ENCOUNTER — Ambulatory Visit (INDEPENDENT_AMBULATORY_CARE_PROVIDER_SITE_OTHER): Payer: Medicare Other | Admitting: Internal Medicine

## 2016-10-13 ENCOUNTER — Ambulatory Visit
Admission: RE | Admit: 2016-10-13 | Discharge: 2016-10-13 | Disposition: A | Discharge status: Home / Self Care | Payer: Medicare Other | Source: Ambulatory Visit | Attending: Internal Medicine | Admitting: Internal Medicine

## 2016-10-13 DIAGNOSIS — A310 Pulmonary mycobacterial infection: Secondary | ICD-10-CM

## 2016-10-13 NOTE — Progress Notes (Signed)
[facility] for Infectious Disease  Patient Active Problem List   Diagnosis Date Noted  . 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 Nausea 08/18/2016  . Peripheral neuropathy 03/08/2016  . Borderline diabetes   . Rheumatoid arthritis ([place]) 10/29/2015  . DJD (degenerative joint disease) 10/29/2015  . Osteoporosis 10/29/2015  . Osteoarthritis of right hip 11/21/2014  . 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   ASPIRIN EC 81 MG TABLET    Take 81 mg by mouth daily.   AZITHROMYCIN (ZITHROMAX) 500 MG TABLET    Take 1 tablet (500 mg total) by mouth every Monday, Wednesday, and Friday.   BRIMONIDINE (ALPHAGAN) 0.2 % OPHTHALMIC SOLUTION       CALCIUM CITRATE-VITAMIN D (CALCIUM + D PO)    Take 1 tablet by mouth daily.   CELECOXIB (CELEBREX) 100 MG CAPSULE    Take 1 capsule (100 mg total) by mouth daily as needed (1-2 tablets per day as needed).   DORZOLAMIDE-TIMOLOL (COSOPT) 22.3-6.8 MG/ML OPHTHALMIC SOLUTION    Place 1 drop into both eyes 2 (two) times daily.   ETHAMBUTOL (MYAMBUTOL) 400 MG TABLET    Take 4 tablets (1,600 mg total) by mouth every Monday, Wednesday, and Friday.   FLUTICASONE-SALMETEROL (ADVAIR) 100-50 MCG/DOSE AEPB    Inhale 1 puff into the lungs daily. ONCE DAILY   HYDROCHLOROTHIAZIDE (HYDRODIURIL) 50 MG TABLET    Take 50 mg by mouth daily.   IBANDRONATE (BONIVA) 150 MG TABLET    Take 150 mg by mouth every 30 (thirty) days. Take in the morning with a full glass of water, on an empty stomach, and do not take anything else by mouth or lie down for the next 30 min.   LATANOPROST (XALATAN) 0.005 % OPHTHALMIC SOLUTION    Place 1 drop into both eyes at  bedtime.   NIFEDIPINE (PROCARDIA-XL/ADALAT-CC/NIFEDICAL-XL) 30 MG 24 HR TABLET    Take 60 mg by mouth every morning.    ONDANSETRON (ZOFRAN) 4 MG TABLET    Take 1 tablet (4 mg total) by mouth every 8 (eight) hours as needed for nausea or vomiting.   POTASSIUM CHLORIDE (KLOR-CON) 20 MEQ PACKET    Take 20 mEq by mouth daily.   PREGABALIN (LYRICA) 75 MG CAPSULE    Take 1 capsule (75 mg total) by mouth 2 (two) times daily.   RIFAMPIN (RIFADIN) 300 MG CAPSULE    Take 1 capsule (300 mg total) by mouth every Monday, Wednesday, and Friday.   ROSUVASTATIN (CRESTOR) 20 MG TABLET    Take 20 mg by mouth daily.  Modified Medications   No medications on file  Discontinued Medications   No medications on file    Subjective: Ms. Petz is seen on a work in basis. Over the last month she has had more problems tolerating her azithromycin, ethambutol and rifampin that she takes every Monday Wednesday Friday for her Mycobacterium avium pneumonia. She started taking them before bedtime and that did help decrease the problems with nausea. However over the past month she has felt extremely exhausted for increasing periods of time after she takes her medications. Her last dose was  10/10/2016. The following day she was unable to get up off of the couch. She also notes a slight increase in cough productive of thin clear sputum. She thinks there might have been slight worsening of her dyspnea on exertion as well. She has not had any fever.  Review of Systems: Review of Systems  Constitutional: Positive for malaise/fatigue. Negative for chills, diaphoresis, fever and weight loss.  HENT: Negative for sore throat.   Respiratory: Positive for cough, sputum production and shortness of breath. Negative for hemoptysis.   Cardiovascular: Negative for chest pain.  Gastrointestinal: Negative for abdominal pain, diarrhea, heartburn, nausea and vomiting.  Genitourinary: Negative for dysuria and frequency.  Musculoskeletal:  Negative for joint pain and myalgias.  Skin: Negative for rash.  Neurological: Positive for weakness. Negative for dizziness and headaches.    Past Medical History:  Diagnosis Date  . Arthritis   . Asthma   . Borderline diabetes   . Complication of anesthesia    "I SLEEP A VERY VERY LONG TIME"  . Eczema   . Environmental allergies   . Glaucoma   . History of transfusion   . Hyperlipidemia   . Hypertension   . Tingling    OF FEET    Social History  Substance Use Topics  . Smoking status: Never Smoker  . Smokeless tobacco: Never Used  . Alcohol use No    No family history on file.  No Known Allergies  Objective: Vitals:   10/13/16 1436  BP: (!) 174/108  Pulse: (!) 114  Temp: 98.4 F (36.9 C)  TempSrc: Oral  Weight: 165 lb 1.9 oz (74.9 kg)   Body mass index is 28.34 kg/m.  Physical Exam  Constitutional: She is oriented to person, place, and time.  She is smiling and in good spirits.  HENT:  Mouth/Throat: No oropharyngeal exudate.  Eyes: Conjunctivae are normal.  Cardiovascular: Normal rate and regular rhythm.   No murmur heard. Pulmonary/Chest: Effort normal and breath sounds normal. She has no wheezes. She has no rales.  Abdominal: Soft. She exhibits no mass. There is no tenderness.  Musculoskeletal: Normal range of motion.  Neurological: She is alert and oriented to person, place, and time.  Skin: No rash noted.  Psychiatric: Mood and affect normal.    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 I had hoped to go a full 12 months but she is having a great deal of difficulty tolerating her medications. She is feeling better today after not taking her routine doses yesterday as I instructed. I will have her continue to hold the medications and I will check a chest x-ray and repeat sputum AFB stain  and culture. I will call her with those results. She will follow-up here in 2 months.      Relevant Orders   AFB Culture & Smear   DG Chest 2 View       Michel Bickers, MD [facility] for [place] [phone] pager   [phone] cell 10/13/2016, 3:03 PM

## 2016-10-13 NOTE — Assessment & Plan Note (Signed)
She is now completed 10 months of 3 drug therapy for Mycobacterium avium pneumonia. I had hoped to go a full 12 months but she is having a great deal of difficulty tolerating her medications. She is feeling better today after not taking her routine doses yesterday as I instructed. I will have her continue to hold the medications and I will check a chest x-ray and repeat sputum AFB stain and culture. I will call her with those results. She will follow-up here in 2 months.

## 2016-10-14 ENCOUNTER — Telehealth: Payer: Self-pay | Admitting: Internal Medicine

## 2016-10-14 NOTE — Telephone Encounter (Signed)
I called Teresa French and reviewed her chest x-ray findings. It does show some right lower lobe infiltrate but I suspect this is residual from the bilateral infiltrates that were seen on her outside scan last fall rather than new pneumonia. She is feeling better off of her Mycobacterium avium antibiotics. I will continue to hold them and wait on repeat sputum culture results. She is in agreement with that plan.

## 2016-10-18 ENCOUNTER — Other Ambulatory Visit: Payer: Medicare Other

## 2016-10-18 DIAGNOSIS — A310 Pulmonary mycobacterial infection: Secondary | ICD-10-CM

## 2016-10-21 LAB — RESPIRATORY CULTURE OR RESPIRATORY AND SPUTUM CULTURE
Gram Stain: NONE SEEN
ORGANISM ID, BACTERIA: NORMAL

## 2016-11-29 ENCOUNTER — Encounter: Payer: Self-pay | Admitting: Internal Medicine

## 2016-11-29 ENCOUNTER — Ambulatory Visit (INDEPENDENT_AMBULATORY_CARE_PROVIDER_SITE_OTHER): Payer: Medicare Other | Admitting: Internal Medicine

## 2016-11-29 DIAGNOSIS — A310 Pulmonary mycobacterial infection: Secondary | ICD-10-CM

## 2016-11-29 NOTE — Assessment & Plan Note (Signed)
She is doing well off of antibiotics for her Mycobacterium avium pneumonia. Hopefully her infection has been cured. I talked to her about warning signs for possible relapse. She will follow-up here as needed.

## 2016-11-29 NOTE — Progress Notes (Signed)
[facility]ious [facility]  Patient Active Problem List   Diagnosis Date Noted  . 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 Nausea 08/18/2016  . Peripheral neuropathy 03/08/2016  . Borderline diabetes   . Rheumatoid arthritis ([place]) 10/29/2015  . DJD (degenerative joint disease) 10/29/2015  . Osteoporosis 10/29/2015  . Osteoarthritis of right hip 11/21/2014  . 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   ASPIRIN EC 81 MG TABLET    Take 81 mg by mouth daily.   BRIMONIDINE (ALPHAGAN) 0.2 % OPHTHALMIC SOLUTION       CALCIUM CITRATE-VITAMIN D (CALCIUM + D PO)    Take 1 tablet by mouth daily.   CELECOXIB (CELEBREX) 100 MG CAPSULE    Take 1 capsule (100 mg total) by mouth daily as needed (1-2 tablets per day as needed).   DORZOLAMIDE-TIMOLOL (COSOPT) 22.3-6.8 MG/ML OPHTHALMIC SOLUTION    Place 1 drop into both eyes 2 (two) times daily.   FLUTICASONE-SALMETEROL (ADVAIR) 100-50 MCG/DOSE AEPB    Inhale 1 puff into the lungs daily. ONCE DAILY   HYDROCHLOROTHIAZIDE (HYDRODIURIL) 50 MG TABLET    Take 50 mg by mouth daily.   IBANDRONATE (BONIVA) 150 MG TABLET    Take 150 mg by mouth every 30 (thirty) days. Take in the morning with a full glass of water, on an empty stomach, and do not take anything else by mouth or lie down for the next 30 min.   LATANOPROST (XALATAN) 0.005 % OPHTHALMIC SOLUTION    Place 1 drop into both eyes at bedtime.   NIFEDIPINE (PROCARDIA-XL/ADALAT-CC/NIFEDICAL-XL) 30 MG 24 HR TABLET    Take 60 mg by mouth every morning.    ONDANSETRON (ZOFRAN) 4 MG TABLET    Take 1 tablet (4 mg total) by mouth every 8 (eight) hours as needed for nausea or  vomiting.   POTASSIUM CHLORIDE (KLOR-CON) 20 MEQ PACKET    Take 20 mEq by mouth daily.   PREGABALIN (LYRICA) 75 MG CAPSULE    Take 1 capsule (75 mg total) by mouth 2 (two) times daily.   ROSUVASTATIN (CRESTOR) 20 MG TABLET    Take 20 mg by mouth daily.  Modified Medications   No medications on file  Discontinued Medications   AZITHROMYCIN (ZITHROMAX) 500 MG TABLET    Take 1 tablet (500 mg total) by mouth every Monday, Wednesday, and Friday.   ETHAMBUTOL (MYAMBUTOL) 400 MG TABLET    Take 4 tablets (1,600 mg total) by mouth every Monday, Wednesday, and Friday.   RIFAMPIN (RIFADIN) 300 MG CAPSULE    Take 1 capsule (300 mg total) by mouth every Monday, Wednesday, and Friday.    Subjective: [name] is in for her routine follow-up visit. 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 She stopped because she was having increasing problems tolerating her antibiotics. Since stopping she has felt much better. She is less fatigued. She is now able to get up at her usual time 5 AM. She has back getting regular exercise. She has minimal cough with no sputum production. She has only mild dyspnea on  exertion when climbing stairs.  Review of Systems: Review of Systems  Constitutional: Negative for chills, diaphoresis, fever, malaise/fatigue and weight loss.  HENT: Negative for sore throat.   Respiratory: Positive for cough and shortness of breath. Negative for hemoptysis and sputum production.   Cardiovascular: Negative for chest pain.  Gastrointestinal: Negative for abdominal pain, diarrhea, heartburn, nausea and vomiting.  Genitourinary: Negative for dysuria and frequency.  Musculoskeletal: Negative for joint pain and myalgias.  Skin: Negative for rash.  Neurological: Negative for dizziness and headaches.    Past Medical History:  Diagnosis Date  . Arthritis   . Asthma   . Borderline diabetes   . Complication of anesthesia      "I SLEEP A VERY VERY LONG TIME"  . Eczema   . Environmental allergies   . Glaucoma   . History of transfusion   . Hyperlipidemia   . Hypertension   . Tingling    OF FEET    Social History  Substance Use Topics  . Smoking status: Never Smoker  . Smokeless tobacco: Never Used  . Alcohol use No    No family history on file.  No Known Allergies  Objective: Vitals:   11/29/16 0854  BP: (!) 166/90  Pulse: (!) 111  Temp: 98.3 F (36.8 C)  TempSrc: Oral  Weight: 167 lb (75.8 kg)   Body mass index is 28.67 kg/m.  Physical Exam  Constitutional: She is oriented to person, place, and time.  HENT:  Mouth/Throat: No oropharyngeal exudate.  Eyes: Conjunctivae are normal.  Cardiovascular: Normal rate and regular rhythm.   No murmur heard. Pulmonary/Chest: Effort normal and breath sounds normal. She has no wheezes. She has no rales.  Abdominal: Soft. She exhibits no mass. There is no tenderness.  Musculoskeletal: Normal range of motion.  Neurological: She is alert and oriented to person, place, and time.  Skin: No rash noted.  Psychiatric: Mood and affect normal.    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 Hopefully her infection has been cured. I talked to her about warning signs for possible relapse. She will follow-up here as needed.          Michel Bickers, MD [facility] [phone] pager   [phone] cell 11/29/2016, 9:12 AM

## 2017-02-14 ENCOUNTER — Other Ambulatory Visit: Payer: Self-pay | Admitting: Internal Medicine

## 2017-02-14 ENCOUNTER — Telehealth: Payer: Self-pay | Admitting: Internal Medicine

## 2017-02-14 DIAGNOSIS — J069 Acute upper respiratory infection, unspecified: Secondary | ICD-10-CM

## 2017-02-14 MED ORDER — OSELTAMIVIR PHOSPHATE 75 MG PO CAPS: 75.0000 mg | ORAL_CAPSULE | Freq: Two times a day (BID) | ORAL | 0 refills | Status: DC

## 2017-02-14 NOTE — Telephone Encounter (Signed)
I received a phone call today from [name], Ms. Poliquin's daughter. She told me that Ms. Bamba had sudden onset of fever up to 101 yesterday associated with chills, sore throat and runny nose. She did receive her influenza vaccination last month. I elected to treat her empirically with oseltamivir.

## 2017-03-06 ENCOUNTER — Other Ambulatory Visit: Payer: Self-pay | Admitting: Internal Medicine

## 2017-03-06 DIAGNOSIS — J069 Acute upper respiratory infection, unspecified: Secondary | ICD-10-CM

## 2017-07-10 ENCOUNTER — Encounter: Payer: Self-pay | Admitting: Oncology

## 2017-07-10 ENCOUNTER — Other Ambulatory Visit: Payer: Self-pay

## 2017-07-10 ENCOUNTER — Other Ambulatory Visit: Payer: Self-pay | Admitting: Oncology

## 2017-07-10 ENCOUNTER — Ambulatory Visit: Payer: Medicare Other | Admitting: Oncology

## 2017-07-10 VITALS — BP 160/70 | HR 104 | Temp 97.9°F | Ht 64.0 in | Wt 171.3 lb

## 2017-07-10 DIAGNOSIS — Z96641 Presence of right artificial hip joint: Secondary | ICD-10-CM | POA: Diagnosis not present

## 2017-07-10 DIAGNOSIS — A310 Pulmonary mycobacterial infection: Secondary | ICD-10-CM | POA: Diagnosis not present

## 2017-07-10 DIAGNOSIS — I10 Essential (primary) hypertension: Secondary | ICD-10-CM

## 2017-07-10 DIAGNOSIS — M24542 Contracture, left hand: Secondary | ICD-10-CM

## 2017-07-10 DIAGNOSIS — Z803 Family history of malignant neoplasm of breast: Secondary | ICD-10-CM | POA: Diagnosis not present

## 2017-07-10 DIAGNOSIS — M24541 Contracture, right hand: Secondary | ICD-10-CM | POA: Diagnosis not present

## 2017-07-10 DIAGNOSIS — N63 Unspecified lump in unspecified breast: Secondary | ICD-10-CM

## 2017-07-10 DIAGNOSIS — M069 Rheumatoid arthritis, unspecified: Secondary | ICD-10-CM

## 2017-07-10 DIAGNOSIS — Z8709 Personal history of other diseases of the respiratory system: Secondary | ICD-10-CM

## 2017-07-10 DIAGNOSIS — Z872 Personal history of diseases of the skin and subcutaneous tissue: Secondary | ICD-10-CM | POA: Diagnosis not present

## 2017-07-10 DIAGNOSIS — H409 Unspecified glaucoma: Secondary | ICD-10-CM

## 2017-07-10 DIAGNOSIS — M151 Heberden's nodes (with arthropathy): Secondary | ICD-10-CM

## 2017-07-10 DIAGNOSIS — N6320 Unspecified lump in the left breast, unspecified quadrant: Secondary | ICD-10-CM

## 2017-07-10 DIAGNOSIS — G8929 Other chronic pain: Secondary | ICD-10-CM

## 2017-07-10 HISTORY — DX: Unspecified lump in the left breast, unspecified quadrant: N63.20

## 2017-07-10 HISTORY — DX: Essential (primary) hypertension: I10

## 2017-07-10 NOTE — Progress Notes (Signed)
New Patient Hematology   Teresa French [id number] 04-16-1940 77 y.o. 07/10/2017  CC:   Reason for referral: Suspicious mass in the left breast on mammogram  HPI:  77 year old woman who had a routine mammogram on June 19, 2017 which showed heterogeneously dense breast tissue with a suspicious lobulated mass on the left superior and lateral to the nipple.  This was confirmed by ultrasound which showed a 1.4 x 0.5 x 1.0 cm mass at 1:00 suspicious for malignancy. She had her menarche at age 80.  Menopause at age 30.  First pregnancy age 89.  She has had multiple prior bilateral breast cyst aspirations most recent about 10 years ago.   There is no breast cancer in any first-degree relative including her mother who died of complications during cardiac bypass surgery at age 91, her 65 sisters or 3 brothers, her 16 year old daughter.  Her daughter does have fibrocystic disease.  She has 2 granddaughters aged 53 and 65 with no breast problems.  PMH: Past Medical History:  Diagnosis Date  . Arthritis   . Asthma   . Borderline diabetes   . Breast mass, left 07/10/2017   06/19/17: mammos: lobulated mass sup & lat to nipple; [place]: 1.4x0.5x1.0 cm  Bi-RADS4  . Complication of anesthesia    "I SLEEP A VERY VERY LONG TIME"  . Eczema   . Environmental allergies   . Glaucoma   . History of transfusion   . HTN (hypertension), benign 07/10/2017  . Hyperlipidemia   . Hypertension   . Tingling    OF FEET  Last year she was treated for MAI lung infection with ethambutol, rifampin, and azithromycin for 10 months.  She completed therapy in October 2018. History of rheumatoid arthritis.  On oral methotrexate for about 1 year stopped about 3 years ago. No history of MTB, pneumonia, ulcers, hepatitis, yellow jaundice, mononucleosis, diabetes, thyroid disease, kidney disease, seizure or stroke.  Positive degenerative arthritis and osteoporosis.  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  Past Surgical  History:  Procedure Laterality Date  . BREAST BIOPSY     3-5 EACH BREAST  . CERVICAL LAMINECTOMY    . DILATION AND CURETTAGE OF UTERUS    . GANGLION CYST EXCISION     X3  . JOINT REPLACEMENT  2007   left hip  . TOTAL HIP ARTHROPLASTY Right 11/21/2014   Procedure: RIGHT TOTAL HIP ARTHROPLASTY ANTERIOR APPROACH;  Surgeon: Mcarthur Rossetti, MD;  Location: [facility];  Service: Orthopedics;  Laterality: Right;    Allergies: No Known Allergies  Medications:  Current Outpatient Medications:  .  albuterol (PROVENTIL HFA;VENTOLIN HFA) 108 (90 Base) MCG/ACT inhaler, Inhale 2 puffs into the lungs every 4 (four) hours as needed for wheezing or shortness of breath., Disp: , Rfl:  .  aspirin EC 81 MG tablet, Take 81 mg by mouth daily., Disp: , Rfl:  .  brimonidine (ALPHAGAN) 0.2 % ophthalmic solution, , Disp: , Rfl:  .  Calcium Citrate-Vitamin D (CALCIUM + D PO), Take 1 tablet by mouth daily., Disp: , Rfl:  .  celecoxib (CELEBREX) 100 MG capsule, Take 1 capsule (100 mg total) by mouth daily as needed (1-2 tablets per day as needed)., Disp: 90 capsule, Rfl: 3 .  dorzolamide-timolol (COSOPT) 22.3-6.8 MG/ML ophthalmic solution, Place 1 drop into both eyes 2 (two) times daily., Disp: , Rfl:  .  Fluticasone-Salmeterol (ADVAIR) 100-50 MCG/DOSE AEPB, Inhale 1 puff into the lungs daily. ONCE DAILY, Disp: , Rfl:  .  hydrochlorothiazide (HYDRODIURIL)  50 MG tablet, Take 50 mg by mouth daily., Disp: , Rfl:  .  ibandronate (BONIVA) 150 MG tablet, Take 150 mg by mouth every 30 (thirty) days. Take in the morning with a full glass of water, on an empty stomach, and do not take anything else by mouth or lie down for the next 30 min., Disp: , Rfl:  .  latanoprost (XALATAN) 0.005 % ophthalmic solution, Place 1 drop into both eyes at bedtime., Disp: , Rfl:  .  NIFEdipine (PROCARDIA-XL/ADALAT-CC/NIFEDICAL-XL) 30 MG 24 hr tablet, Take 60 mg by mouth every morning. , Disp: , Rfl:  .  ondansetron (ZOFRAN) 4 MG tablet,  Take 1 tablet (4 mg total) by mouth every 8 (eight) hours as needed for nausea or vomiting., Disp: 30 tablet, Rfl: 3 .  oseltamivir (TAMIFLU) 75 MG capsule, Take 1 capsule (75 mg total) by mouth 2 (two) times daily., Disp: 10 capsule, Rfl: 0 .  potassium chloride (KLOR-CON) 20 MEQ packet, Take 20 mEq by mouth daily., Disp: , Rfl:  .  pregabalin (LYRICA) 75 MG capsule, Take 1 capsule (75 mg total) by mouth 2 (two) times daily., Disp: 60 capsule, Rfl: 5 .  rosuvastatin (CRESTOR) 20 MG tablet, Take 20 mg by mouth daily., Disp: , Rfl:   Social History: She is a widow.  Husband died of non-Hodgkin's lymphoma.  reports that she has never smoked.  Only child [name] who is a Designer, jewellery.  She has fibrocystic disease of the breast.   She has never used smokeless tobacco. She reports that she does not drink alcohol or use drugs.  Family History: See HPI.  Father died of lung cancer at age 28.  Also had non-Hodgkin's lymphoma.  A paternal aunt had breast cancer.  Review of Systems: No constitutional symptoms.  She has glaucoma.  Uses drops.  Vision otherwise stable.  No cardiorespiratory complaints.  She does get dyspnea when she walks up a flight of stairs.  No chest pain.  No abdominal pain or change in bowel habit.  No bone pain other than that related to her arthritis.  She get a better result on the right hip than the left following joint replacement with respect to chronic pain.  She does have stiffness and deformity of her fingers. Remaining ROS negative.  Physical Exam: Blood pressure (!) 160/70, pulse (!) 104, temperature 97.9 F (36.6 C), temperature source Oral, height '5\' 4"'  (1.626 m), weight 171 lb 4.8 oz (77.7 kg), SpO2 99 %. Wt Readings from Last 3 Encounters:  07/10/17 171 lb 4.8 oz (77.7 kg)  11/29/16 167 lb (75.8 kg)  10/13/16 165 lb 1.9 oz (74.9 kg)     General appearance: Well-nourished African-American woman looking younger than her stated age HENNT: Pharynx no  erythema, exudate, mass, or ulcer. No thyromegaly or thyroid nodules Lymph nodes: No cervical, supraclavicular, or axillary lymphadenopathy Breasts: Large breasted.  No abnormal skin changes, no dominant mass in either breast Lungs: Clear to auscultation, resonant to percussion throughout Heart: Regular rhythm, no murmur, no gallop, no rub, no click, no edema Abdomen: Soft, nontender, normal bowel sounds, no mass, no organomegaly Extremities: No edema, no calf tenderness Musculoskeletal: joint deformities of her fingers with Heberden's nodes.  Flexion contractures. GU: Not examined Vascular: Carotid pulses 2+, no bruits, distal pulses: Dorsalis pedis 1+ symmetric Neurologic: Alert, oriented, PERRLA, .  Cranial nerves grossly normal, motor strength 5 over 5, reflexes 1+ symmetric, upper body coordination normal, gait normal, vibration sensation with minimal decrease in  vibration sense over the fingers of her right hand but not the left.  Absent cutaneous sensation on the feet but present ability to perceive vibration over the bones of the feet. Skin: No rash or ecchymosis    Lab Results: Lab Results  Component Value Date   WBC 4.1 03/08/2016   HGB 12.5 03/08/2016   HCT 38.6 03/08/2016   MCV 88.9 03/08/2016   PLT 252 03/08/2016     Chemistry      Component Value Date/Time   NA 140 03/08/2016 1630   K 3.7 03/08/2016 1630   CL 103 03/08/2016 1630   CO2 27 03/08/2016 1630   BUN 11 03/08/2016 1630   CREATININE 0.90 03/08/2016 1630      Component Value Date/Time   CALCIUM 9.3 03/08/2016 1630   ALKPHOS 55 03/08/2016 1630   AST 18 03/08/2016 1630   ALT 10 03/08/2016 1630   BILITOT 0.3 03/08/2016 1630       Pathology: Biopsy pending   Radiological Studies: See HPI for recent mammogram and breast ultrasound results A chest x-ray done January 27, 2017 was normal.    Impression: Clinical stage I breast cancer    Recommendation: We discussed the management of early stage  breast cancer in general.  Her daughter, a Designer, jewellery, was present. At age 5 this is an age-related and not a hereditary breast cancer if biopsy shows malignancy.  Therefore no need to do a screen for breast cancer genes other than standard HER-2/neu analysis and Oncotype DX 21 gene assay. 26 of women her age will be ER positive. I have arranged for her to have a needle biopsy at the breast imaging center here in [place].  Further management based on results. If invasive or preinvasive cancer is diagnosed, she will begin single agent hormonal therapy with an aromatase inhibitor. She will need a lumpectomy.  We may be able to defer radiation therapy at her age if lumpectomy confirms a small tumor. I will see her back as soon as biopsy and ancillary data available.   Murriel Hopper, MD, [facility]  Hematology-Oncology/Internal Medicine  07/10/2017, 3:17 PM

## 2017-07-10 NOTE — Patient Instructions (Addendum)
To lab today Return visit 3 weeks in PM 45 minute visit to discuss biopsy results

## 2017-07-11 LAB — CBC WITH DIFFERENTIAL/PLATELET
BASOS: 1 %
Basophils Absolute: 0 10*3/uL (ref 0.0–0.2)
EOS (ABSOLUTE): 0.3 10*3/uL (ref 0.0–0.4)
EOS: 6 %
HEMATOCRIT: 42.6 % (ref 34.0–46.6)
Hemoglobin: 13.7 g/dL (ref 11.1–15.9)
Immature Grans (Abs): 0 10*3/uL (ref 0.0–0.1)
Immature Granulocytes: 0 %
Lymphocytes Absolute: 2.1 10*3/uL (ref 0.7–3.1)
Lymphs: 45 %
MCH: 28.4 pg (ref 26.6–33.0)
MCHC: 32.2 g/dL (ref 31.5–35.7)
MCV: 88 fL (ref 79–97)
MONOS ABS: 0.5 10*3/uL (ref 0.1–0.9)
Monocytes: 11 %
NEUTROS ABS: 1.7 10*3/uL (ref 1.4–7.0)
Neutrophils: 37 %
PLATELETS: 214 10*3/uL (ref 150–379)
RBC: 4.82 x10E6/uL (ref 3.77–5.28)
RDW: 14.4 % (ref 12.3–15.4)
WBC: 4.7 10*3/uL (ref 3.4–10.8)

## 2017-07-11 LAB — COMPREHENSIVE METABOLIC PANEL
ALBUMIN: 4.5 g/dL (ref 3.5–4.8)
ALT: 16 IU/L (ref 0–32)
AST: 32 IU/L (ref 0–40)
Albumin/Globulin Ratio: 1.5 (ref 1.2–2.2)
Alkaline Phosphatase: 75 IU/L (ref 39–117)
BUN / CREAT RATIO: 15 (ref 12–28)
BUN: 12 mg/dL (ref 8–27)
Bilirubin Total: 0.3 mg/dL (ref 0.0–1.2)
CALCIUM: 9.6 mg/dL (ref 8.7–10.3)
CO2: 25 mmol/L (ref 20–29)
Chloride: 99 mmol/L (ref 96–106)
Creatinine, Ser: 0.82 mg/dL (ref 0.57–1.00)
GFR, EST AFRICAN AMERICAN: 80 mL/min/{1.73_m2} (ref >59)
GFR, EST NON AFRICAN AMERICAN: 69 mL/min/{1.73_m2} (ref >59)
GLOBULIN, TOTAL: 3 g/dL (ref 1.5–4.5)
Glucose: 86 mg/dL (ref 65–99)
Potassium: 3.3 mmol/L — ABNORMAL LOW (ref 3.5–5.2)
SODIUM: 140 mmol/L (ref 134–144)
TOTAL PROTEIN: 7.5 g/dL (ref 6.0–8.5)

## 2017-07-11 LAB — LACTATE DEHYDROGENASE: LDH: 238 IU/L — ABNORMAL HIGH (ref 119–226)

## 2017-07-13 ENCOUNTER — Ambulatory Visit
Admission: RE | Admit: 2017-07-13 | Discharge: 2017-07-13 | Disposition: A | Discharge status: Home / Self Care | Payer: Medicare Other | Source: Ambulatory Visit | Attending: Oncology | Admitting: Oncology

## 2017-07-13 ENCOUNTER — Other Ambulatory Visit: Payer: Self-pay | Admitting: Oncology

## 2017-07-13 DIAGNOSIS — N63 Unspecified lump in unspecified breast: Secondary | ICD-10-CM

## 2017-07-24 ENCOUNTER — Other Ambulatory Visit: Payer: Self-pay

## 2017-07-24 ENCOUNTER — Encounter: Payer: Self-pay | Admitting: Oncology

## 2017-07-24 ENCOUNTER — Ambulatory Visit: Payer: Medicare Other | Admitting: Oncology

## 2017-07-24 VITALS — BP 135/70 | HR 104 | Temp 97.5°F | Ht 64.0 in | Wt 169.6 lb

## 2017-07-24 DIAGNOSIS — N6320 Unspecified lump in the left breast, unspecified quadrant: Secondary | ICD-10-CM

## 2017-07-24 NOTE — Patient Instructions (Signed)
Return  In 6 months after mammogram done in November

## 2017-07-24 NOTE — Progress Notes (Signed)
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  She was found to have an asymptomatic 1.4 x 0.5 x 1.0 lobular density in the left breast superior and lateral to the nipple.  Previous fibrocystic disease of the breast with bilateral cyst aspirations in the past.  No risk factors for breast cancer other than her age. 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 Options at this point include observation alone with repeat mammogram in 6 months which will be scheduled.  This lesion has a very low malignant potential and if it remains stable does not necessitate a partial mastectomy.  Other option would be for immediate surgical excision.  The patient favors watchful waiting and I concur.  She lives in [place].  She will return to [facility] in 6 months for a repeat mammogram and further disposition at that time.  If she needs a surgical referral I suggested Dr. Adonis Housekeeper or Dr. Rolm Bookbinder.

## 2017-11-29 ENCOUNTER — Other Ambulatory Visit: Payer: Self-pay | Admitting: Oncology

## 2017-11-29 DIAGNOSIS — R921 Mammographic calcification found on diagnostic imaging of breast: Secondary | ICD-10-CM

## 2018-01-15 ENCOUNTER — Ambulatory Visit: Payer: Medicare Other

## 2018-01-15 ENCOUNTER — Ambulatory Visit
Admission: RE | Admit: 2018-01-15 | Discharge: 2018-01-15 | Disposition: A | Discharge status: Home / Self Care | Payer: Medicare Other | Source: Ambulatory Visit | Attending: Oncology | Admitting: Oncology

## 2018-01-15 DIAGNOSIS — R921 Mammographic calcification found on diagnostic imaging of breast: Secondary | ICD-10-CM

## 2018-01-23 ENCOUNTER — Ambulatory Visit: Payer: Medicare Other | Admitting: Oncology

## 2018-01-23 ENCOUNTER — Ambulatory Visit: Payer: Medicare Other | Admitting: Internal Medicine

## 2018-01-23 ENCOUNTER — Encounter: Payer: Self-pay | Admitting: Oncology

## 2018-01-23 ENCOUNTER — Ambulatory Visit
Admission: RE | Admit: 2018-01-23 | Discharge: 2018-01-23 | Disposition: A | Discharge status: Home / Self Care | Payer: Medicare Other | Source: Ambulatory Visit | Attending: Internal Medicine | Admitting: Internal Medicine

## 2018-01-23 ENCOUNTER — Other Ambulatory Visit: Payer: Self-pay

## 2018-01-23 VITALS — BP 135/62 | HR 100 | Temp 98.0°F | Ht 64.0 in | Wt 178.5 lb

## 2018-01-23 VITALS — BP 139/83 | HR 105 | Temp 98.2°F | Ht 64.0 in | Wt 177.0 lb

## 2018-01-23 DIAGNOSIS — R0600 Dyspnea, unspecified: Secondary | ICD-10-CM | POA: Diagnosis not present

## 2018-01-23 DIAGNOSIS — A310 Pulmonary mycobacterial infection: Secondary | ICD-10-CM

## 2018-01-23 DIAGNOSIS — D242 Benign neoplasm of left breast: Secondary | ICD-10-CM | POA: Diagnosis not present

## 2018-01-23 DIAGNOSIS — Z8709 Personal history of other diseases of the respiratory system: Secondary | ICD-10-CM | POA: Diagnosis not present

## 2018-01-23 DIAGNOSIS — R739 Hyperglycemia, unspecified: Secondary | ICD-10-CM | POA: Diagnosis not present

## 2018-01-23 DIAGNOSIS — N6320 Unspecified lump in the left breast, unspecified quadrant: Secondary | ICD-10-CM

## 2018-01-23 NOTE — Progress Notes (Signed)
Hematology and Oncology Follow Up Visit  Teresa French [id number] 06/14/1940 77 y.o. 01/23/2018 8:05 PM   Principle Diagnosis: Encounter Diagnosis  Name Primary?  . Breast mass, left Yes  Clinical summary: 77 year old woman who had a routine mammogram on June 19, 2017 which showed heterogeneously dense breast tissue with a suspicious lobulated mass on the left superior and lateral to the nipple.  This was confirmed by ultrasound which showed a 1.4 x 0.5 x 1.0 cm mass at 1:00 suspicious for malignancy. She had her menarche at age 61.  Menopause at age 54.  First pregnancy age 6.  She has had multiple prior bilateral breast cyst aspirations most recent about 10 years ago.   There is no breast cancer in any first-degree relative including her mother who died of complications during cardiac bypass surgery at age 43, her 4 sisters or 3 brothers, her 41 year old daughter.  Her daughter does have fibrocystic disease.  She has 2 granddaughters aged 27 and 74 with no breast problems. The area of concern was biopsied on July 13, 2017 and was found to be a benign fibroadenoma with adjacent nonmalignant calcifications.  Patient was offered surgical excision versus close follow-up and she elected for the latter.   Interim History:    She is felt no new lumps in her breasts.  Follow-up left mammogram done January 15, 2018 with tomography views shows a stable nodular breast parenchymal pattern with no suspicious mass microcalcification or architectural distortion.  Biopsy clip from previous biopsy noted. The patient and her daughter are reassured.  I feel no mass lesions in her breasts on my exam today.  She is complaining of recurrent dyspnea primarily with exertion and going upstairs.  Some atypical chest discomfort in the right pectoral region.  Intermittent cough.  She had similar symptoms in the past when she was diagnosed with MAI.  She is due to see her infectious disease specialist later  today.  Medications: reviewed  Allergies: No Known Allergies  Review of Systems: See interim history Remaining ROS negative:   Physical Exam: Blood pressure 135/62, pulse 100, temperature 98 F (36.7 C), temperature source Oral, height 5\' 4"  (1.626 m), weight 178 lb 8 oz (81 kg), SpO2 99 %. Wt Readings from Last 3 Encounters:  01/23/18 177 lb (80.3 kg)  01/23/18 178 lb 8 oz (81 kg)  07/24/17 169 lb 9.6 oz (76.9 kg)     General appearance: Well-nourished African-American woman HENNT: Pharynx no erythema, exudate, mass, or ulcer. No thyromegaly or thyroid nodules Lymph nodes: No cervical, supraclavicular, or axillary lymphadenopathy Breasts: No abnormal skin changes, no dominant mass in either breast Lungs: Fine rales heard at both lung bases.  Lungs are resonant to percussion throughout Heart: Regular rhythm, no murmur, no gallop, no rub, no click, no edema Abdomen: Soft, nontender, normal bowel sounds, no mass, no organomegaly Extremities: No edema, no calf tenderness Musculoskeletal: no joint deformities GU:  Vascular: Carotid pulses 2+, no bruits,  Neurologic: Alert, oriented, PERRLA, arcus senilis, intraocular lenses, cranial nerves grossly normal, motor strength 5 over 5, reflexes 1+ symmetric, upper body coordination normal, gait normal, Skin: No rash or ecchymosis  Lab Results: CBC W/Diff    Component Value Date/Time   WBC 4.7 07/10/2017 1457   WBC 4.1 03/08/2016 1630   RBC 4.82 07/10/2017 1457   RBC 4.34 03/08/2016 1630   HGB 13.7 07/10/2017 1457   HCT 42.6 07/10/2017 1457   PLT 214 07/10/2017 1457   MCV 88 07/10/2017 1457  MCH 28.4 07/10/2017 1457   MCH 28.8 03/08/2016 1630   MCHC 32.2 07/10/2017 1457   MCHC 32.4 03/08/2016 1630   RDW 14.4 07/10/2017 1457   LYMPHSABS 2.1 07/10/2017 1457   EOSABS 0.3 07/10/2017 1457   BASOSABS 0.0 07/10/2017 1457     Chemistry      Component Value Date/Time   NA 140 07/10/2017 1457   K 3.3 (L) 07/10/2017 1457   CL 99  07/10/2017 1457   CO2 25 07/10/2017 1457   BUN 12 07/10/2017 1457   CREATININE 0.82 07/10/2017 1457   CREATININE 0.90 03/08/2016 1630      Component Value Date/Time   CALCIUM 9.6 07/10/2017 1457   ALKPHOS 75 07/10/2017 1457   AST 32 07/10/2017 1457   ALT 16 07/10/2017 1457   BILITOT 0.3 07/10/2017 1457       Radiological Studies: Mm Diag Breast Tomo Uni Left  Result Date: 01/15/2018 CLINICAL DATA:  77 year old patient had a left breast biopsy demonstrating a fibroadenoma in the upper-outer quadrant of the left breast. EXAM: DIGITAL DIAGNOSTIC UNILATERAL LEFT MAMMOGRAM WITH CAD AND TOMO COMPARISON:  Previous exam(s) from [facility] dated June 22, 2017. Post clip mammogram July 13, 2017. Bilateral screening mammogram May 30, 2016 ACR Breast Density Category c: The breast tissue is heterogeneously dense, which may obscure small masses. FINDINGS: Ribbon shaped biopsy clip is positioned in the upper-outer quadrant of the left breast, anterior third, in stable position. The nodular parenchymal pattern is stable. No suspicious mass, suspicious microcalcification, architectural distortion. Mammographic images were processed with CAD. IMPRESSION: Stable parenchymal pattern of the left breast. Biopsy-proven fibroadenoma upper outer quadrant. No evidence of malignancy. RECOMMENDATION: Bilateral screening mammogram is recommended in April 2020. I have discussed the findings and recommendations with the patient. Results were also provided in writing at the conclusion of the visit. If applicable, a reminder letter will be sent to the patient regarding the next appointment. BI-RADS CATEGORY  2: Benign. Electronically Signed   By: Curlene Dolphin M.D.   On: 01/15/2018 16:04    Impression: 1.  Fibroadenoma left breast. Stable 50-month interval mammogram.  Radiologist recommends bilateral mammography 6 months from now in February 2020.  2.  Early pulmonary symptoms with history of MAI She will be  reevaluated by infectious disease today. Chest x-ray was done.  Not officially read.  I reviewed the images and there are no gross infiltrates or effusions.   CC: Patient Care Team: Louellen Molder, MD as PCP - General   Murriel Hopper, MD, [facility]  Hematology-Oncology/Internal Medicine     11/5/20198:05 PM

## 2018-01-23 NOTE — Addendum Note (Signed)
Addended by: Dolan Amen D on: 01/23/2018 03:04 PM   Modules accepted: Orders

## 2018-01-23 NOTE — Assessment & Plan Note (Signed)
Her worsening symptoms could reflect a relapse of Mycobacterium avium pneumonia.  I will get a repeat chest x-ray today and order sputum for AFB stain and culture.  She will follow-up in 4 weeks

## 2018-01-23 NOTE — Patient Instructions (Signed)
Return to Oncology as needed Set up an appointment with Dr Jonelle Sidle to monitor your mammograms in the future

## 2018-01-23 NOTE — Addendum Note (Signed)
Addended by: Alberteen Sam on: 01/23/2018 02:41 PM   Modules accepted: Orders

## 2018-01-23 NOTE — Progress Notes (Signed)
[facility] for Infectious [facility]  Patient Active Problem List   Diagnosis Date Noted  . 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 Breast mass, left 07/10/2017  . HTN (hypertension), benign 07/10/2017  . URI (upper respiratory infection) 02/14/2017  . Nausea 08/18/2016  . Peripheral neuropathy 03/08/2016  . Borderline diabetes   . Rheumatoid arthritis ([place]) 10/29/2015  . DJD (degenerative joint disease) 10/29/2015  . Osteoporosis 10/29/2015  . Osteoarthritis of right hip 11/21/2014  . 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   ASPIRIN EC 81 MG TABLET    Take 81 mg by mouth daily.   BRIMONIDINE (ALPHAGAN) 0.2 % OPHTHALMIC SOLUTION       CALCIUM CITRATE-VITAMIN D (CALCIUM + D PO)    Take 1 tablet by mouth daily.   CELECOXIB (CELEBREX) 100 MG CAPSULE    Take 1 capsule (100 mg total) by mouth daily as needed (1-2 tablets per day as needed).   DORZOLAMIDE-TIMOLOL (COSOPT) 22.3-6.8 MG/ML OPHTHALMIC SOLUTION    Place 1 drop into both eyes 2 (two) times daily.   FLUTICASONE-SALMETEROL (ADVAIR) 100-50 MCG/DOSE AEPB    Inhale 1 puff into the lungs daily. ONCE DAILY   HYDROCHLOROTHIAZIDE (HYDRODIURIL) 50 MG TABLET    Take 50 mg by mouth daily.   IBANDRONATE (BONIVA) 150 MG TABLET    Take 150 mg by mouth every 30 (thirty) days. Take in the morning with a full glass of water, on an empty stomach, and do not take anything else by mouth or lie down for the next 30 min.   LATANOPROST (XALATAN) 0.005 % OPHTHALMIC SOLUTION    Place 1 drop into both eyes at bedtime.   NIFEDIPINE (PROCARDIA-XL/ADALAT-CC/NIFEDICAL-XL) 30 MG 24 HR TABLET    Take 60 mg by mouth every morning.    ONDANSETRON (ZOFRAN) 4 MG TABLET    Take 1 tablet (4 mg total) by mouth every 8 (eight) hours as needed for nausea or vomiting.   POTASSIUM CHLORIDE (KLOR-CON) 20 MEQ PACKET    Take 20 mEq by mouth daily.   PREGABALIN (LYRICA) 75 MG CAPSULE    Take 1 capsule (75 mg total) by mouth 2 (two) times daily.   ROSUVASTATIN (CRESTOR) 20 MG TABLET    Take 20 mg by mouth daily.  Modified Medications   No medications on file  Discontinued Medications   No medications on file    Subjective: Teresa French is in with her daughter, Teresa French, for routine follow-up visit.  She completed 10 months of azithromycin, ethambutol and rifampin in July 2018.  Over the past several months she has noted gradually worsening cough, occasionally productive of white sputum and slightly worsening dyspnea on exertion.  She has also had some intermittent sharp pains above her right breast.  She does not know of anything that causes the pain to come on or get worse she has not had any fever or chills but occasionally notes night sweats.  Her appetite is very good and she has regained the weight she lost last year.  She is also noted some hoarseness.  Review of Systems: Review of Systems  Constitutional: Positive for  diaphoresis and malaise/fatigue. Negative for chills, fever and weight loss.  Respiratory: Positive for cough, sputum production, shortness of breath and wheezing. Negative for hemoptysis.   Cardiovascular: Positive for chest pain.  Gastrointestinal: Negative for abdominal pain, diarrhea, nausea and vomiting.  Skin: Negative for rash.    Past Medical History:  Diagnosis Date  . Arthritis   . Asthma   . Borderline diabetes   . Breast mass, left 07/10/2017   06/19/17: mammos: lobulated mass sup & lat to nipple; [place]: 1.4x0.5x1.0 cm  Bi-RADS4  . Complication of anesthesia    "I SLEEP A VERY VERY LONG TIME"  . Eczema   . Environmental allergies   . Glaucoma   . History of transfusion   . HTN (hypertension),  benign 07/10/2017  . Hyperlipidemia   . Hypertension   . Tingling    OF FEET    Social History   Tobacco Use  . Smoking status: Never Smoker  . Smokeless tobacco: Never Used  Substance Use Topics  . Alcohol use: No  . Drug use: No    Family History  Problem Relation Age of Onset  . Breast cancer Paternal Aunt   . Breast cancer Cousin   . Breast cancer Cousin     No Known Allergies  Objective: Vitals:   01/23/18 1407  BP: 139/83  Pulse: (!) 105  Temp: 98.2 F (36.8 C)  Weight: 177 lb (80.3 kg)  Height: 5\' 4"  (1.626 m)   Body mass index is 30.38 kg/m.  Physical Exam  Constitutional: She is oriented to person, place, and time.  She is in good spirits.  Weight is up 8 pounds in the past 6 months.  HENT:  Mouth/Throat: No oropharyngeal exudate.  Cardiovascular: Normal rate, regular rhythm and normal heart sounds.  Pulmonary/Chest: Effort normal and breath sounds normal. She has no wheezes. She has no rales.  Neurological: She is alert and oriented to person, place, and time.  Skin: No rash noted.  Psychiatric: She has a normal mood and affect.    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  I will get a repeat chest x-ray today and order sputum for AFB stain and culture.  She will follow-up in 4 weeks      Relevant Orders   DG Chest 2 View   MYCOBACTERIA, CULTURE, WITH FLUOROCHROME SMEAR       Michel Bickers, MD [facility] for [place] [phone] pager   [phone] cell 01/23/2018, 2:30 PM

## 2018-01-24 ENCOUNTER — Other Ambulatory Visit: Payer: Medicare Other

## 2018-01-24 ENCOUNTER — Other Ambulatory Visit: Payer: Self-pay | Admitting: Oncology

## 2018-01-24 DIAGNOSIS — A310 Pulmonary mycobacterial infection: Secondary | ICD-10-CM

## 2018-01-24 DIAGNOSIS — I10 Essential (primary) hypertension: Secondary | ICD-10-CM

## 2018-01-24 DIAGNOSIS — M1610 Unilateral primary osteoarthritis, unspecified hip: Secondary | ICD-10-CM

## 2018-01-24 DIAGNOSIS — N6320 Unspecified lump in the left breast, unspecified quadrant: Secondary | ICD-10-CM

## 2018-01-24 DIAGNOSIS — D242 Benign neoplasm of left breast: Secondary | ICD-10-CM

## 2018-01-24 LAB — COMPREHENSIVE METABOLIC PANEL
AG Ratio: 1.4 (calc) (ref 1.0–2.5)
ALBUMIN MSPROF: 4.2 g/dL (ref 3.6–5.1)
ALKALINE PHOSPHATASE (APISO): 66 U/L (ref 33–130)
ALT: 12 U/L (ref 6–29)
AST: 21 U/L (ref 10–35)
BUN: 12 mg/dL (ref 7–25)
CHLORIDE: 103 mmol/L (ref 98–110)
CO2: 29 mmol/L (ref 20–32)
CREATININE: 0.78 mg/dL (ref 0.60–0.93)
Calcium: 9.5 mg/dL (ref 8.6–10.4)
GLOBULIN: 3 g/dL (ref 1.9–3.7)
GLUCOSE: 99 mg/dL (ref 65–99)
Potassium: 3.7 mmol/L (ref 3.5–5.3)
Sodium: 140 mmol/L (ref 135–146)
Total Bilirubin: 0.4 mg/dL (ref 0.2–1.2)
Total Protein: 7.2 g/dL (ref 6.1–8.1)

## 2018-01-24 LAB — CBC WITH DIFFERENTIAL/PLATELET
BASOS ABS: 51 {cells}/uL (ref 0–200)
Basophils Relative: 1 %
EOS ABS: 230 {cells}/uL (ref 15–500)
Eosinophils Relative: 4.5 %
HCT: 37 % (ref 35.0–45.0)
Hemoglobin: 12.3 g/dL (ref 11.7–15.5)
Lymphs Abs: 1918 cells/uL (ref 850–3900)
MCH: 28.5 pg (ref 27.0–33.0)
MCHC: 33.2 g/dL (ref 32.0–36.0)
MCV: 85.6 fL (ref 80.0–100.0)
MONOS PCT: 11 %
MPV: 11.6 fL (ref 7.5–12.5)
NEUTROS PCT: 45.9 %
Neutro Abs: 2341 cells/uL (ref 1500–7800)
PLATELETS: 229 10*3/uL (ref 140–400)
RBC: 4.32 10*6/uL (ref 3.80–5.10)
RDW: 13.5 % (ref 11.0–15.0)
TOTAL LYMPHOCYTE: 37.6 %
WBC mixed population: 561 cells/uL (ref 200–950)
WBC: 5.1 10*3/uL (ref 3.8–10.8)

## 2018-01-24 LAB — HEMOGLOBIN A1C
EAG (MMOL/L): 7.3 (calc)
HEMOGLOBIN A1C: 6.2 %{Hb} — AB (ref <5.7)
MEAN PLASMA GLUCOSE: 131 (calc)

## 2018-02-08 ENCOUNTER — Other Ambulatory Visit: Payer: Self-pay | Admitting: Family Medicine

## 2018-02-20 ENCOUNTER — Encounter: Payer: Self-pay | Admitting: Internal Medicine

## 2018-02-20 ENCOUNTER — Ambulatory Visit (INDEPENDENT_AMBULATORY_CARE_PROVIDER_SITE_OTHER): Payer: Medicare Other | Admitting: Internal Medicine

## 2018-02-20 DIAGNOSIS — A310 Pulmonary mycobacterial infection: Secondary | ICD-10-CM

## 2018-02-20 MED ORDER — ETHAMBUTOL HCL 400 MG PO TABS: 2000.0000 mg | ORAL_TABLET | ORAL | 11 refills | Status: DC

## 2018-02-20 MED ORDER — RIFAMPIN 300 MG PO CAPS: 600.0000 mg | ORAL_CAPSULE | ORAL | 11 refills | Status: DC

## 2018-02-20 MED ORDER — AZITHROMYCIN 500 MG PO TABS: 500.0000 mg | ORAL_TABLET | ORAL | 11 refills | Status: DC

## 2018-02-20 NOTE — Progress Notes (Signed)
[facility] for Infectious Disease  Patient Active Problem List   Diagnosis Date Noted  . 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 Breast mass, left 07/10/2017  . HTN (hypertension), benign 07/10/2017  . URI (upper respiratory infection) 02/14/2017  . Nausea 08/18/2016  . Peripheral neuropathy 03/08/2016  . Borderline diabetes   . Rheumatoid arthritis ([place]) 10/29/2015  . DJD (degenerative joint disease) 10/29/2015  . Osteoporosis 10/29/2015  . Osteoarthritis of right hip 11/21/2014  . 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    Patient's Medications  New Prescriptions   AZITHROMYCIN (ZITHROMAX) 500 MG TABLET    Take 1 tablet (500 mg total) by mouth 3 (three) times a week.   ETHAMBUTOL (MYAMBUTOL) 400 MG TABLET    Take 5 tablets (2,000 mg total) by mouth 3 (three) times a week.   RIFAMPIN (RIFADIN) 300 MG CAPSULE    Take 2 capsules (600 mg total) by mouth 3 (three) times a week.  Previous Medications   ALBUTEROL (PROVENTIL HFA;VENTOLIN HFA) 108 (90 BASE) MCG/ACT INHALER    Inhale 2 puffs into the lungs every 4 (four) hours as needed for wheezing or shortness of breath.   AMITRIPTYLINE (ELAVIL) 25 MG TABLET    Take 25 mg by mouth at bedtime.   ASPIRIN EC 81 MG TABLET    Take 81 mg by mouth daily.   BRIMONIDINE (ALPHAGAN) 0.2 % OPHTHALMIC SOLUTION       CALCIUM CITRATE-VITAMIN D (CALCIUM + D PO)    Take 1 tablet by mouth daily.   CELECOXIB (CELEBREX) 100 MG CAPSULE    Take 1 capsule (100 mg total) by mouth daily as needed (1-2 tablets per day as needed).   DORZOLAMIDE-TIMOLOL (COSOPT) 22.3-6.8 MG/ML OPHTHALMIC SOLUTION    Place 1 drop into both eyes 2 (two) times daily.   FLUTICASONE-SALMETEROL (ADVAIR) 100-50 MCG/DOSE AEPB    Inhale 1 puff into the lungs daily. ONCE DAILY   HYDROCHLOROTHIAZIDE (HYDRODIURIL) 50 MG TABLET    Take 50 mg by mouth daily.   IBANDRONATE (BONIVA) 150 MG TABLET    Take 150 mg by mouth every  30 (thirty) days. Take in the morning with a full glass of water, on an empty stomach, and do not take anything else by mouth or lie down for the next 30 min.   LATANOPROST (XALATAN) 0.005 % OPHTHALMIC SOLUTION    Place 1 drop into both eyes at bedtime.   LEFLUNOMIDE (ARAVA) 20 MG TABLET    Take 20 mg by mouth daily.   NIFEDIPINE (PROCARDIA-XL/ADALAT-CC/NIFEDICAL-XL) 30 MG 24 HR TABLET    Take 60 mg by mouth every morning.    ONDANSETRON (ZOFRAN) 4 MG TABLET    Take 1 tablet (4 mg total) by mouth every 8 (eight) hours as needed for nausea or vomiting.   POTASSIUM CHLORIDE (KLOR-CON) 20 MEQ PACKET    Take 20 mEq by mouth daily.   PREGABALIN (LYRICA) 75 MG CAPSULE    Take 1 capsule (75 mg total) by mouth 2 (two) times daily.   ROSUVASTATIN (CRESTOR) 20 MG TABLET    Take 20 mg by mouth daily.  Modified Medications   No medications on file  Discontinued Medications   No medications on file    Subjective: Ms. Coletta is in with her daughter, [name], for routine follow-up visit.  She completed 10 months of azithromycin, ethambutol and rifampin in July 2018.  Over the  past several months she has noted gradually worsening cough, occasionally productive of white sputum and slightly worsening dyspnea on exertion.  She has not had any fever or chills but occasionally notes night sweats.    She is also noted some hoarseness.  Review of Systems: Review of Systems  Constitutional: Positive for diaphoresis and malaise/fatigue. Negative for chills, fever and weight loss.  Respiratory: Positive for cough, sputum production and shortness of breath. Negative for hemoptysis and wheezing.   Cardiovascular: Negative for chest pain.  Gastrointestinal: Negative for abdominal pain, diarrhea, nausea and vomiting.  Skin: Negative for rash.    Past Medical History:  Diagnosis Date  . Arthritis   . Asthma   . Borderline diabetes   . Breast mass, left 07/10/2017   06/19/17: mammos: lobulated mass sup & lat  to nipple; [place]: 1.4x0.5x1.0 cm  Bi-RADS4  . Complication of anesthesia    "I SLEEP A VERY VERY LONG TIME"  . Eczema   . Environmental allergies   . Glaucoma   . History of transfusion   . HTN (hypertension), benign 07/10/2017  . Hyperlipidemia   . Hypertension   . Tingling    OF FEET    Social History   Tobacco Use  . Smoking status: Never Smoker  . Smokeless tobacco: Never Used  Substance Use Topics  . Alcohol use: No  . Drug use: No    Family History  Problem Relation Age of Onset  . Breast cancer Paternal Aunt   . Breast cancer Cousin   . Breast cancer Cousin     No Known Allergies  Objective: Vitals:   02/20/18 1356  BP: (!) 158/85  Pulse: 98  Resp: 18  Temp: 98 F (36.7 C)  TempSrc: Oral  SpO2: 97%  Weight: 175 lb (79.4 kg)  Height: 5\' 5"  (1.651 m)   Body mass index is 29.12 kg/m.  Physical Exam  Constitutional: She is oriented to person, place, and time.  She is in good spirits.  Her weight is down 3 pounds over the past month.  HENT:  Mouth/Throat: No oropharyngeal exudate.  Cardiovascular: Normal rate, regular rhythm and normal heart sounds.  Pulmonary/Chest: Effort normal and breath sounds normal. She has no wheezes. She has no rales.  Neurological: She is alert and oriented to person, place, and time.  Skin: No rash noted.  Psychiatric: She has a normal mood and affect.    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  It will probably be several more weeks before we have final identification and antibiotic susceptibilities.  I reviewed treatment options with him today including holding off on antibiotic therapy pending antibiotic susceptibilities or restarting 3 times  weekly azithromycin, ethambutol and rifampin.  Both she and her daughter would like to restart antibiotic therapy now and follow-up with antibiotic susceptibility results are available.  I would also like her and her rheumatologist, Dr. Dennie Bible, to consider alternatives to her Jolee Ewing since it is a risk factor for relapse and could make it more difficult for her to respond to a second round of antibiotic therapy.  I did tell them that emergence of antibiotic resistance at the time of relapse is  common and could make her infection difficult or impossible to cure.  She will follow-up in 4 to 6 weeks.      Relevant Medications   azithromycin (ZITHROMAX) 500 MG tablet (Start on 02/21/2018)   rifampin (RIFADIN) 300 MG capsule (Start on 02/21/2018)   ethambutol (MYAMBUTOL) 400 MG tablet (Start on 02/21/2018)       Michel Bickers, MD [facility] for [facility] [phone] pager   [phone] cell 02/20/2018, 3:04 PM

## 2018-02-20 NOTE — Assessment & Plan Note (Signed)
Her chest x-ray 1 month ago did not show any acute abnormalities or active disease.  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  It will probably be several more weeks before we have final identification and antibiotic susceptibilities.  I reviewed treatment options with him today including holding off on antibiotic therapy pending antibiotic susceptibilities or restarting 3 times weekly azithromycin, ethambutol and rifampin.  Both she and her daughter would like to restart antibiotic therapy now and follow-up with antibiotic susceptibility results are available.  I would also like her and her rheumatologist, Dr. Dennie Bible, to consider alternatives to her Jolee Ewing since it is a risk factor for relapse and could make it more difficult for her to respond to a second round of antibiotic therapy.  I did tell them that emergence of antibiotic resistance at the time of relapse is common and could make her infection difficult or impossible to cure.  She will follow-up in 4 to 6 weeks.

## 2018-02-26 ENCOUNTER — Telehealth: Payer: Self-pay | Admitting: Behavioral Health

## 2018-02-26 NOTE — Telephone Encounter (Signed)
[facility] called with Urgent preliminary lab results from 01/24/2018: Mycobacteria, Culture, with  Fluorochrome Smear Specimen Information: Sputum     Component 63mo ago  MICRO NUMBER: [id number] P  SPECIMEN QUALITY: Adequate P  Source: SPUTUM P  STATUS: PRELIMINARY P  SMEAR: No acid fast bacilli seen. P  ISOLATE 1: mycobacterium, non-tuberculosis Abnormal  P  Comment: Mycobacterium species, not M.tuberculosis complex Isolate forwarded to [place]. for Identification. DNA probe result negative for M. avium complex.       Pricilla Riffle RN

## 2018-02-27 ENCOUNTER — Telehealth: Payer: Self-pay

## 2018-02-27 DIAGNOSIS — A310 Pulmonary mycobacterial infection: Secondary | ICD-10-CM

## 2018-02-27 NOTE — Telephone Encounter (Signed)
Patient is calling for brand name of ethambutol. She states regimen failed previously with generic . It is possible treatment was not with Dr Megan Salon.   Medication change request submitted  to pharmacy. Pharmacist states it may require a prior approval or higher co pay,  they would need to order medication and she is not sure when it will arrive or if  this medication exist.   I reviewed medication list and it does not indicate she has used brand for prior treatment. I also tried to reorder in Epic and medication reverted to generic each time.   I submitted change to pharmacy via phone and will contact patient with above information.   Laverle Patter, RN

## 2018-03-15 ENCOUNTER — Telehealth: Payer: Self-pay

## 2018-03-15 DIAGNOSIS — A310 Pulmonary mycobacterial infection: Secondary | ICD-10-CM

## 2018-03-15 MED ORDER — RIFAMPIN 300 MG PO CAPS: 600.0000 mg | ORAL_CAPSULE | ORAL | 11 refills | Status: DC

## 2018-03-15 NOTE — Telephone Encounter (Signed)
Patients daughter calling regarding f/u labs and Mother will be out of medication on Friday this week. Culture results recently completed and have not been reviewed by Dr Megan Salon.  Refill of rifampin called to pharmacy. Patient informed Dr Megan Salon will inform of any changes in current regimen based on lab results.   Laverle Patter ,RN

## 2018-03-19 NOTE — Telephone Encounter (Signed)
I would like for her to stay on her current 3 drug regimen of azithromycin, ethambutol and rifampin.  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  I am going to request antibiotic susceptibility testing.

## 2018-03-22 ENCOUNTER — Telehealth: Payer: Self-pay

## 2018-03-22 DIAGNOSIS — A310 Pulmonary mycobacterial infection: Secondary | ICD-10-CM

## 2018-03-22 NOTE — Telephone Encounter (Signed)
Spoke with patient and her daughter regarding current regimen  per Dr Megan Salon.  Patient is to continue 3 drug regimen of azithromycin, ethambutol and rifampin . He will send specimen for susceptibility testing.   Laverle Patter, RN

## 2018-04-17 ENCOUNTER — Ambulatory Visit (INDEPENDENT_AMBULATORY_CARE_PROVIDER_SITE_OTHER): Payer: Medicare Other | Admitting: Internal Medicine

## 2018-04-17 ENCOUNTER — Encounter: Payer: Self-pay | Admitting: Internal Medicine

## 2018-04-17 DIAGNOSIS — A310 Pulmonary mycobacterial infection: Secondary | ICD-10-CM | POA: Diagnosis not present

## 2018-04-17 LAB — SUSCEPT, AFB RAPID
Amikacin: <=1 ug/mL
Ceoxitin: 16 ug/mL
Ciprofloxacin: 0.25 ug/mL
Clarithromycin: >16 ug/mL
Doxycycline: >16 ug/mL
Imipenem: 8 ug/mL
Linezolid: 2 ug/mL
Minocycline: >8 ug/mL
Moxifloxxacin: <=0.25 ug/mL
Tigecycline: <=0.015

## 2018-04-17 LAB — CP MYCOBACTERIAL IDENTIFICATION

## 2018-04-17 LAB — MYCOBACTERIA,CULT W/FLUOROCHROME SMEAR
MICRO NUMBER: 91341762
SMEAR: NONE SEEN
SPECIMEN QUALITY: ADEQUATE

## 2018-04-17 NOTE — Assessment & Plan Note (Signed)
She may have a smoldering respiratory infection due to a second nontuberculous mycobacteria.  We do not have the antibiotic susceptibilities back yet on the mycobacteria porcinum.  I cannot determine if she feels better because of the antibiotics for because she is on prednisone.  She will continue her antibiotics for now.  She is tolerating them well.  She will follow-up here in 6 weeks.

## 2018-04-17 NOTE — Progress Notes (Signed)
[facility] for Infectious Disease  Patient Active Problem List   Diagnosis Date Noted  . 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 Breast mass, left 07/10/2017  . HTN (hypertension), benign 07/10/2017  . URI (upper respiratory infection) 02/14/2017  . Nausea 08/18/2016  . Peripheral neuropathy 03/08/2016  . Borderline diabetes   . Rheumatoid arthritis ([place]) 10/29/2015  . DJD (degenerative joint disease) 10/29/2015  . Osteoporosis 10/29/2015  . Osteoarthritis of right hip 11/21/2014  . 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   AMITRIPTYLINE (ELAVIL) 25 MG TABLET    Take 25 mg by mouth at bedtime.   ASPIRIN EC 81 MG TABLET    Take 81 mg by mouth daily.   AZITHROMYCIN (ZITHROMAX) 500 MG TABLET    Take 1 tablet (500 mg total) by mouth 3 (three) times a week.   BRIMONIDINE (ALPHAGAN) 0.2 % OPHTHALMIC SOLUTION       CALCIUM CITRATE-VITAMIN D (CALCIUM + D PO)    Take 1 tablet by mouth daily.   CELECOXIB (CELEBREX) 100 MG CAPSULE    Take 1 capsule (100 mg total) by mouth daily as needed (1-2 tablets per day as needed).   DORZOLAMIDE-TIMOLOL (COSOPT) 22.3-6.8 MG/ML OPHTHALMIC SOLUTION    Place 1 drop into both eyes 2 (two) times daily.   ETHAMBUTOL (MYAMBUTOL) 400 MG TABLET    Take 5 tablets (2,000 mg total) by mouth 3 (three) times a week.   FLUTICASONE-SALMETEROL (ADVAIR) 100-50 MCG/DOSE AEPB    Inhale 1 puff into the lungs daily. ONCE DAILY   HYDROCHLOROTHIAZIDE (HYDRODIURIL) 50 MG TABLET    Take 50 mg by mouth daily.   IBANDRONATE (BONIVA) 150 MG TABLET    Take 150 mg by mouth every 30 (thirty) days. Take in the morning with a full glass of water, on an empty  stomach, and do not take anything else by mouth or lie down for the next 30 min.   LATANOPROST (XALATAN) 0.005 % OPHTHALMIC SOLUTION    Place 1 drop into both eyes at bedtime.   LEFLUNOMIDE (ARAVA) 20 MG TABLET    Take 20 mg by mouth daily.   NIFEDIPINE (PROCARDIA-XL/ADALAT-CC/NIFEDICAL-XL) 30 MG 24 HR TABLET    Take 60 mg by mouth every morning.    ONDANSETRON (ZOFRAN) 4 MG TABLET    Take 1 tablet (4 mg total) by mouth every 8 (eight) hours as needed for nausea or vomiting.   POTASSIUM CHLORIDE (KLOR-CON) 20 MEQ PACKET    Take 20 mEq by mouth daily.   PREGABALIN (LYRICA) 75 MG CAPSULE    Take 1 capsule (75 mg total) by mouth 2 (two) times daily.   RIFAMPIN (RIFADIN) 300 MG CAPSULE    Take 2 capsules (600 mg total) by mouth 3 (three) times a week.   ROSUVASTATIN (CRESTOR) 20 MG TABLET    Take 20 mg by mouth daily.  Modified Medications   No medications on file  Discontinued Medications   No medications on file    Subjective: Teresa French is in for her routine follow-up visit. She completed 10 months of azithromycin, ethambutol and rifampin in July 2018 for Mycobacterium  avium pneumonia.  Over the past several months she has noted gradually worsening cough, occasionally productive of white sputum and slightly worsening dyspnea on exertion.  Her chest x-ray in November did not show any acute abnormalities but her sputum culture grew another nontuberculous Mycobacterium, mycobacteria porcimun.  She restarted azithromycin, ethambutol and rifampin at the time of her last visit here on 02/20/2018.  She is feeling better.  She says that she has much more energy and her cough has improved.  She still notes some dyspnea on exertion.  She met with her rheumatologist, Dr. Dennie Bible, who discontinued her [place].  Apparently she has noted some tinnitus and hearing loss that started about 3 months ago (before she went back on her antibiotics).  She was noted to have some middle ear fluid buildup and was started on 5 mg  of prednisone.  She was seen by ear nose and throat about 2 weeks ago and her prednisone was increased to 20 mg daily.  Review of Systems: Review of Systems  Constitutional: Negative for chills, diaphoresis, fever and weight loss.  HENT: Positive for hearing loss and tinnitus. Negative for congestion, ear pain and sore throat.   Respiratory: Positive for cough and shortness of breath. Negative for hemoptysis, sputum production and wheezing.   Gastrointestinal: Negative for abdominal pain, diarrhea, nausea and vomiting.    Past Medical History:  Diagnosis Date  . Arthritis   . Asthma   . Borderline diabetes   . Breast mass, left 07/10/2017   06/19/17: mammos: lobulated mass sup & lat to nipple; [place]: 1.4x0.5x1.0 cm  Bi-RADS4  . Complication of anesthesia    "I SLEEP A VERY VERY LONG TIME"  . Eczema   . Environmental allergies   . Glaucoma   . History of transfusion   . HTN (hypertension), benign 07/10/2017  . Hyperlipidemia   . Hypertension   . Tingling    OF FEET    Social History   Tobacco Use  . Smoking status: Never Smoker  . Smokeless tobacco: Never Used  Substance Use Topics  . Alcohol use: No  . Drug use: No    Family History  Problem Relation Age of Onset  . Breast cancer Paternal Aunt   . Breast cancer Cousin   . Breast cancer Cousin     No Known Allergies  Objective: Vitals:   04/17/18 1532 04/17/18 1638  BP: (!) 212/113 (!) 181/99  Pulse: (!) 139 (!) 125  Temp: 98.7 F (37.1 C) 97.7 F (36.5 C)  Weight: 180 lb (81.6 kg)    Body mass index is 29.95 kg/m.  Physical Exam Constitutional:      Comments: She is smiling and in good spirits.  Cardiovascular:     Rate and Rhythm: Normal rate and regular rhythm.     Heart sounds: No murmur.  Pulmonary:     Effort: Pulmonary effort is normal.     Breath sounds: Normal breath sounds. No wheezing or rales.  Psychiatric:        Mood and Affect: Mood normal.     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  We do not have the antibiotic susceptibilities back yet on the mycobacteria porcinum.  I cannot determine if she feels better because of the antibiotics for because she is on  prednisone.  She will continue her antibiotics for now.  She is tolerating them well.  She will follow-up here in 6 weeks.          Michel Bickers, MD [facility] for [facility] [phone] pager   [phone] cell 04/17/2018, 4:47 PM

## 2018-05-14 ENCOUNTER — Other Ambulatory Visit: Payer: Self-pay | Admitting: Neurological Surgery

## 2018-05-14 DIAGNOSIS — M47816 Spondylosis without myelopathy or radiculopathy, lumbar region: Secondary | ICD-10-CM

## 2018-05-17 ENCOUNTER — Ambulatory Visit
Admission: RE | Admit: 2018-05-17 | Discharge: 2018-05-17 | Disposition: A | Discharge status: Home / Self Care | Payer: Medicare Other | Source: Ambulatory Visit | Attending: Neurological Surgery | Admitting: Neurological Surgery

## 2018-05-17 DIAGNOSIS — M47816 Spondylosis without myelopathy or radiculopathy, lumbar region: Secondary | ICD-10-CM

## 2018-05-17 MED ORDER — METHYLPREDNISOLONE ACETATE 40 MG/ML INJ SUSP (RADIOLOG: 120.0000 mg | Freq: Once | INTRAMUSCULAR | Status: DC

## 2018-05-17 MED ORDER — IOPAMIDOL (ISOVUE-M 200) INJECTION 41%: 1.0000 mL | Freq: Once | INTRAMUSCULAR | Status: DC

## 2018-05-17 NOTE — Discharge Instructions (Signed)

## 2018-05-21 ENCOUNTER — Encounter: Payer: Self-pay | Admitting: Internal Medicine

## 2018-05-21 ENCOUNTER — Encounter: Payer: Self-pay | Admitting: *Deleted

## 2018-05-21 ENCOUNTER — Ambulatory Visit: Payer: Medicare Other | Admitting: Internal Medicine

## 2018-05-21 ENCOUNTER — Other Ambulatory Visit: Payer: Self-pay

## 2018-05-21 DIAGNOSIS — A310 Pulmonary mycobacterial infection: Secondary | ICD-10-CM | POA: Diagnosis not present

## 2018-05-21 NOTE — Progress Notes (Signed)
[facility] for Infectious Disease  Patient Active Problem List   Diagnosis Date Noted  . 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 Breast mass, left 07/10/2017  . HTN (hypertension), benign 07/10/2017  . Peripheral neuropathy 03/08/2016  . Borderline diabetes   . Rheumatoid arthritis ([place]) 10/29/2015  . DJD (degenerative joint disease) 10/29/2015  . Osteoporosis 10/29/2015  . Osteoarthritis of right hip 11/21/2014  . 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   AMITRIPTYLINE (ELAVIL) 25 MG TABLET    Take 25 mg by mouth at bedtime.   ASPIRIN EC 81 MG TABLET    Take 81 mg by mouth daily.   BRIMONIDINE (ALPHAGAN) 0.2 % OPHTHALMIC SOLUTION       CALCIUM CITRATE-VITAMIN D (CALCIUM + D PO)    Take 1 tablet by mouth daily.   CELECOXIB (CELEBREX) 100 MG CAPSULE    Take 1 capsule (100 mg total) by mouth daily as needed (1-2 tablets per day as needed).   DORZOLAMIDE-TIMOLOL (COSOPT) 22.3-6.8 MG/ML OPHTHALMIC SOLUTION    Place 1 drop into both eyes 2 (two) times daily.   FLUTICASONE-SALMETEROL (ADVAIR) 100-50 MCG/DOSE AEPB    Inhale 1 puff into the lungs daily. ONCE DAILY   HYDROCHLOROTHIAZIDE (HYDRODIURIL) 50 MG TABLET    Take 50 mg by mouth daily.   IBANDRONATE (BONIVA) 150 MG TABLET    Take 150 mg by mouth every 30 (thirty) days. Take in the morning with a full glass of water, on an empty stomach, and do not take anything else by mouth or lie down for the next 30 min.   LATANOPROST (XALATAN) 0.005 % OPHTHALMIC SOLUTION    Place 1 drop into both eyes at bedtime.   LEFLUNOMIDE (ARAVA) 20 MG TABLET    Take 20 mg by mouth daily.   NIFEDIPINE  (PROCARDIA-XL/ADALAT-CC/NIFEDICAL-XL) 30 MG 24 HR TABLET    Take 60 mg by mouth every morning.    ONDANSETRON (ZOFRAN) 4 MG TABLET    Take 1 tablet (4 mg total) by mouth every 8 (eight) hours as needed for nausea or vomiting.   POTASSIUM CHLORIDE (KLOR-CON) 20 MEQ PACKET    Take 20 mEq by mouth daily.   PREGABALIN (LYRICA) 75 MG CAPSULE    Take 1 capsule (75 mg total) by mouth 2 (two) times daily.   ROSUVASTATIN (CRESTOR) 20 MG TABLET    Take 20 mg by mouth daily.  Modified Medications   No medications on file  Discontinued Medications   AZITHROMYCIN (ZITHROMAX) 500 MG TABLET    Take 1 tablet (500 mg total) by mouth 3 (three) times a week.   ETHAMBUTOL (MYAMBUTOL) 400 MG TABLET    Take 5 tablets (2,000 mg total) by mouth 3 (three) times a week.   RIFAMPIN (RIFADIN) 300 MG CAPSULE    Take 2 capsules (600 mg total) by mouth 3 (three) times a week.    Subjective: Teresa French is in for her routine follow-up visit.  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  She is tolerating her antibiotics well.  She is noted significant improvement in  her dyspnea on exertion.  She only occasionally coughs up any sputum.  Review of Systems: Review of Systems  Constitutional: Negative for chills, diaphoresis, fever and weight loss.  Respiratory: Positive for cough, sputum production and shortness of breath. Negative for hemoptysis and wheezing.   Cardiovascular: Negative for chest pain.  Gastrointestinal: Negative for abdominal pain, diarrhea, nausea and vomiting.  Musculoskeletal: Positive for back pain and joint pain.    Past Medical History:  Diagnosis Date  . Arthritis   . Asthma   . Borderline diabetes   . Breast mass, left 07/10/2017   06/19/17: mammos: lobulated mass sup & lat to nipple; [place]: 1.4x0.5x1.0 cm  Bi-RADS4  . Complication of anesthesia    "I SLEEP A VERY VERY LONG TIME"  . Eczema   . Environmental allergies    . Glaucoma   . History of transfusion   . HTN (hypertension), benign 07/10/2017  . Hyperlipidemia   . Hypertension   . Tingling    OF FEET    Social History   Tobacco Use  . Smoking status: Never Smoker  . Smokeless tobacco: Never Used  Substance Use Topics  . Alcohol use: No  . Drug use: No    Family History  Problem Relation Age of Onset  . Breast cancer Paternal Aunt   . Breast cancer Cousin   . Breast cancer Cousin     No Known Allergies  Objective: Vitals:   05/21/18 1434  BP: (!) 179/98  Pulse: 96  Weight: 179 lb 1.9 oz (81.2 kg)  Height: 5\' 5"  (1.651 m)   Body mass index is 29.81 kg/m.  Physical Exam Constitutional:      Comments: She is in good spirits as usual.  Cardiovascular:     Rate and Rhythm: Normal rate and regular rhythm.     Heart sounds: No murmur.  Pulmonary:     Effort: Pulmonary effort is normal.     Breath sounds: Rales present. No wheezing.     Comments: Bilateral crackles in posterior lung fields. Abdominal:     Palpations: Abdomen is soft.     Tenderness: There is no abdominal tenderness.  Psychiatric:        Mood and Affect: Mood normal.     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  It is relatively antibiotic resistant.  The 3 drug regimen she is currently on should not be effective so her recent improvement in dyspnea on exertion is probably due to some other factor.  Her chest x-ray last fall did not show any active infiltrates.  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  Her stop her 3 antibiotics now.  I have asked for an E consult from [facility] in [place], [place].  She will follow-up in 1 month.      Relevant Orders   MYCOBACTERIA, CULTURE, WITH FLUOROCHROME SMEAR        Michel Bickers, MD [facility] for [facility] [phone] pager   [phone] cell 05/21/2018, 2:55 PM

## 2018-05-21 NOTE — Progress Notes (Signed)
LPN provided patient with x2 collection cups for sputum culture AFB. Patient instructed to provided sample and return to [facility] lab.  Teresa Mcalpine, LPN

## 2018-05-21 NOTE — Assessment & Plan Note (Signed)
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  It is relatively antibiotic resistant.  The 3 drug regimen she is currently on should not be effective so her recent improvement in dyspnea on exertion is probably due to some other factor.  Her chest x-ray last fall did not show any active infiltrates.  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  Her stop her 3 antibiotics now.  I have asked for an E consult from [facility] in [place], [place].  She will follow-up in 1 month.

## 2018-05-23 ENCOUNTER — Other Ambulatory Visit: Payer: Medicare Other

## 2018-06-07 ENCOUNTER — Telehealth: Payer: Self-pay | Admitting: Internal Medicine

## 2018-06-27 ENCOUNTER — Ambulatory Visit (INDEPENDENT_AMBULATORY_CARE_PROVIDER_SITE_OTHER): Payer: Medicare Other | Admitting: Internal Medicine

## 2018-06-27 ENCOUNTER — Other Ambulatory Visit: Payer: Self-pay

## 2018-06-27 DIAGNOSIS — A319 Mycobacterial infection, unspecified: Secondary | ICD-10-CM | POA: Insufficient documentation

## 2018-06-27 NOTE — Progress Notes (Signed)
Virtual Visit via Telephone Note  I connected with Teresa French on 06/27/18 at  2:45 PM EDT by telephone and verified that I am speaking with the correct person using two identifiers.   I discussed the limitations, risks, security and privacy concerns of performing an evaluation and management service by telephone and the availability of in person appointments. I also discussed with the patient that there may be a patient responsible charge related to this service. The patient expressed understanding and agreed to proceed.   History of Present Illness: I spoke to [name] by phone today.  She has stable dyspnea on exertion.  She denies any cough or fever.  She has been staying at home and away from other people since it became clear that COVID-19 was here in [place].   Observations/Objective:   Assessment and Plan: She has no clinical evidence of reactivation of her Mycobacterium avium pneumonia.  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  I obtained an E consult from [facility] in [place], [place].  Here is their response:  "Mycobacterium porcinum is a common water isolate and has been found in various water sources including hospitals.  It is almost never a pathogen.  I would not treat this but obtain additional respiratory specimens."  She does not currently have a productive cough or other signs of pneumonia.  Will observe off of antibiotics for now.  Follow Up Instructions: Observe off of antibiotics and follow-up in 3 months   I discussed the assessment and treatment plan with the patient. The patient was provided an opportunity to ask questions and all were answered. The patient agreed with the plan and demonstrated an understanding of the instructions.   The patient was advised to call back or seek an in-person evaluation if the symptoms worsen or if the condition fails to improve as anticipated.  I provided 14 minutes of  non-face-to-face time during this encounter.   Michel Bickers, MD

## 2018-07-09 LAB — MYCOBACTERIA,CULT W/FLUOROCHROME SMEAR
MICRO NUMBER:: 277873
SMEAR:: NONE SEEN
SPECIMEN QUALITY:: ADEQUATE

## 2018-10-10 ENCOUNTER — Ambulatory Visit (INDEPENDENT_AMBULATORY_CARE_PROVIDER_SITE_OTHER): Payer: Medicare Other | Admitting: Internal Medicine

## 2018-10-10 ENCOUNTER — Other Ambulatory Visit: Payer: Self-pay

## 2018-10-10 DIAGNOSIS — A310 Pulmonary mycobacterial infection: Secondary | ICD-10-CM | POA: Diagnosis not present

## 2018-10-10 NOTE — Progress Notes (Signed)
Virtual Visit via Telephone Note  I connected with Teresa French on 10/09/2018 at 5 PM EDT by telephone and verified that I am speaking with the correct person using two identifiers.   I discussed the limitations, risks, security and privacy concerns of performing an evaluation and management service by telephone and the availability of in person appointments. I also discussed with the patient that there may be a patient responsible charge related to this service. The patient expressed understanding and agreed to proceed.   History of Present Illness: I spoke to [name] by phone today.  She has stable dyspnea on exertion.  She denies any cough or fever.  She has been staying at home and away from other people during the [place] pandemic.  Her daughter Teresa French also participated in the phone call.  Teresa French wanted to be certain that she did not need to be back on antibiotics.   Observations/Objective:   Assessment and Plan: She has no clinical evidence of reactivation of her Mycobacterium avium pneumonia.  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  I obtained an E consult from [facility] in [place], [place].  Here is their response:  "Mycobacterium porcinum is a common water isolate and has been found in various water sources including hospitals.  It is almost never a pathogen.  I would not treat this but obtain additional respiratory specimens."  She does not currently have a productive cough or other signs of pneumonia.  She does not need to be back on antibiotics at this time.  Follow Up Instructions: Observe off of antibiotics and follow-up in 3 months   I discussed the assessment and treatment plan with the patient. The patient was provided an opportunity to ask questions and all were answered. The patient agreed with the plan and demonstrated an understanding of the instructions.   The patient was advised to call back or seek an in-person  evaluation if the symptoms worsen or if the condition fails to improve as anticipated.  I provided 15 minutes of non-face-to-face time during this encounter.   Michel Bickers, MD

## 2018-12-05 ENCOUNTER — Other Ambulatory Visit: Payer: Self-pay

## 2018-12-05 ENCOUNTER — Other Ambulatory Visit: Payer: Self-pay | Admitting: Internal Medicine

## 2018-12-05 ENCOUNTER — Ambulatory Visit
Admission: RE | Admit: 2018-12-05 | Discharge: 2018-12-05 | Disposition: A | Discharge status: Home / Self Care | Payer: Medicare Other | Source: Ambulatory Visit | Attending: Internal Medicine | Admitting: Internal Medicine

## 2018-12-05 DIAGNOSIS — Z1231 Encounter for screening mammogram for malignant neoplasm of breast: Secondary | ICD-10-CM

## 2018-12-19 ENCOUNTER — Telehealth: Payer: Self-pay

## 2018-12-19 DIAGNOSIS — R413 Other amnesia: Secondary | ICD-10-CM

## 2018-12-19 NOTE — Telephone Encounter (Signed)
Patient's daughter is calling regarding her Mother's  memory.  The patient has noticed a decline in her memory and has concerns. She would like Dr Leta Baptist with [facility].   Currently she is in transitioning to move to [place] with her daughter and does not have a primary care provider.   She would also like a referral for Dr Delfina Redwood for primary care.   Please advise.

## 2019-01-14 ENCOUNTER — Ambulatory Visit: Payer: Medicare Other | Admitting: Internal Medicine

## 2019-02-11 ENCOUNTER — Other Ambulatory Visit: Payer: Self-pay

## 2019-02-12 ENCOUNTER — Other Ambulatory Visit: Payer: Self-pay

## 2019-02-12 ENCOUNTER — Ambulatory Visit (INDEPENDENT_AMBULATORY_CARE_PROVIDER_SITE_OTHER): Payer: Medicare Other | Admitting: Internal Medicine

## 2019-02-12 ENCOUNTER — Encounter: Payer: Self-pay | Admitting: Internal Medicine

## 2019-02-12 DIAGNOSIS — I82409 Acute embolism and thrombosis of unspecified deep veins of unspecified lower extremity: Secondary | ICD-10-CM | POA: Insufficient documentation

## 2019-02-12 DIAGNOSIS — A310 Pulmonary mycobacterial infection: Secondary | ICD-10-CM

## 2019-02-12 DIAGNOSIS — Z23 Encounter for immunization: Secondary | ICD-10-CM

## 2019-02-12 NOTE — Progress Notes (Signed)
[facility] for Infectious Disease  Patient Active Problem List   Diagnosis Date Noted  . 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 DVT (deep venous thrombosis) ([place]) 02/12/2019  . Mycobacterium infection 06/27/2018  . Breast mass, left 07/10/2017  . HTN (hypertension), benign 07/10/2017  . Peripheral neuropathy 03/08/2016  . Borderline diabetes   . Rheumatoid arthritis ([place]) 10/29/2015  . DJD (degenerative joint disease) 10/29/2015  . Osteoporosis 10/29/2015  . Osteoarthritis of right hip 11/21/2014  . 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   AMITRIPTYLINE (ELAVIL) 25 MG TABLET    Take 25 mg by mouth at bedtime.   ASPIRIN EC 81 MG TABLET    Take 81 mg by mouth daily.   AZITHROMYCIN (ZITHROMAX) 500 MG TABLET       BRIMONIDINE (ALPHAGAN) 0.2 % OPHTHALMIC SOLUTION       CALCIUM CITRATE-VITAMIN D (CALCIUM + D PO)    Take 1 tablet by mouth daily.   CALCIUM CITRATE-VITAMIN D (CALCIUM CITRATE+D3 PETITES) 200-250 MG-UNIT TABS    Take by mouth.   CELECOXIB (CELEBREX) 100 MG CAPSULE    Take 1 capsule (100 mg total) by mouth daily as needed (1-2 tablets per day as needed).   CETIRIZINE (ZYRTEC) 10 MG TABLET    TK 1 T PO QD PRN   CHLORTHALIDONE (HYGROTON) 25 MG TABLET    Take 25 mg by mouth daily.   CIPROFLOXACIN (CILOXAN) 0.3 % OPHTHALMIC SOLUTION    PLACE 1-2 DROPS INTO OU Q 6 H   DORZOLAMIDE-TIMOLOL (COSOPT) 22.3-6.8 MG/ML OPHTHALMIC SOLUTION    Place 1 drop into both eyes 2 (two) times daily.   ETHAMBUTOL (MYAMBUTOL) 400 MG TABLET       FLUTICASONE (FLONASE) 50 MCG/ACT NASAL SPRAY    1 spray by Each Nare route daily.   FLUTICASONE-SALMETEROL (ADVAIR) 100-50  MCG/DOSE AEPB    Inhale 1 puff into the lungs daily. ONCE DAILY   HYDROCHLOROTHIAZIDE (HYDRODIURIL) 50 MG TABLET    Take 50 mg by mouth daily.   IBANDRONATE (BONIVA) 150 MG TABLET    Take 150 mg by mouth every 30 (thirty) days. Take in the morning with a full glass of water, on an empty stomach, and do not take anything else by mouth or lie down for the next 30 min.   LABETALOL (NORMODYNE) 100 MG TABLET       LATANOPROST (XALATAN) 0.005 % OPHTHALMIC SOLUTION    Place 1 drop into both eyes at bedtime.   LEFLUNOMIDE (ARAVA) 20 MG TABLET    Take 20 mg by mouth daily.   LOSARTAN (COZAAR) 25 MG TABLET       NEOMYCIN-POLYMYXIN-HYDROCORTISONE (CORTISPORIN) 3.5-10000-1 OTIC SUSPENSION    SHAKE LQ AND INT 3 GTS IN LEFT EAR QID FOR 10 DAYS   NIFEDIPINE (PROCARDIA-XL/ADALAT-CC/NIFEDICAL-XL) 30 MG 24 HR TABLET    Take 60 mg by mouth every morning.    ONDANSETRON (ZOFRAN) 4 MG TABLET    Take 1 tablet (4 mg total) by mouth every 8 (eight) hours as needed for nausea or vomiting.   POTASSIUM CHLORIDE (KLOR-CON) 20 MEQ PACKET    Take 20 mEq  by mouth daily.   PREGABALIN (LYRICA) 75 MG CAPSULE    Take 1 capsule (75 mg total) by mouth 2 (two) times daily.   REFRESH LIQUIGEL 1 % GEL    See admin instructions.   ROSUVASTATIN (CRESTOR) 20 MG TABLET    Take 20 mg by mouth daily.   XARELTO 10 MG TABS TABLET       ZYLET 0.5-0.3 % SUSP    INT 1 GTT INTO OU BID  Modified Medications   No medications on file  Discontinued Medications   No medications on file    Subjective: Teresa French is in for her routine follow-up visit.  She completed therapy for Mycobacterium avium pneumonia last year.  She has noted some increased difficulty with shortness of breath when walking relates this to wearing a mask during Covid pandemic.  She has not had any change in her cough and rarely brings up any sputum.  She has been living with her daughter here in [place] during the pandemic.  In August she developed acute swelling of her left  lower leg and was diagnosed with DVT.  She has been anticoagulated ever since.  She has had several falls but fortunately no significant bleeding.  Her daughter is concerned about gradual deterioration in of her memory over the past several years.  She is scheduled to see a neurologist soon.  Her daughter has been concerned that she could have dementia but also believes that social isolation during the Covid pandemic is a factor.  Review of Systems: Review of Systems  Constitutional: Negative for fever.  Respiratory: Positive for shortness of breath. Negative for cough, hemoptysis and sputum production.   Cardiovascular: Negative for chest pain.    Past Medical History:  Diagnosis Date  . Arthritis   . Asthma   . Borderline diabetes   . Breast mass, left 07/10/2017   06/19/17: mammos: lobulated mass sup & lat to nipple; [place]: 1.4x0.5x1.0 cm  Bi-RADS4  . Complication of anesthesia    "I SLEEP A VERY VERY LONG TIME"  . Eczema   . Environmental allergies   . Glaucoma   . History of transfusion   . HTN (hypertension), benign 07/10/2017  . Hyperlipidemia   . Hypertension   . Tingling    OF FEET    Social History   Tobacco Use  . Smoking status: Never Smoker  . Smokeless tobacco: Never Used  Substance Use Topics  . Alcohol use: No  . Drug use: No    Family History  Problem Relation Age of Onset  . Breast cancer Paternal Aunt   . Breast cancer Cousin   . Breast cancer Cousin     No Known Allergies  Objective: Vitals:   02/12/19 1139  BP: (!) 147/83  Pulse: 76  Temp: 98.2 F (36.8 C)  TempSrc: Oral  Weight: 189 lb 3.2 oz (85.8 kg)   Body mass index is 31.48 kg/m.  Physical Exam Constitutional:      Comments: She is pleasant and in no distress.  She is accompanied by her daughter, Teresa Mire, NP.  Cardiovascular:     Rate and Rhythm: Normal rate and regular rhythm.     Heart sounds: No murmur.  Pulmonary:     Effort: Pulmonary effort is normal.     Breath  sounds: Normal breath sounds. No wheezing, rhonchi or rales.  Neurological:     General: No focal deficit present.  Psychiatric:        Mood and Affect:  Mood normal.     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  She can follow-up here as needed.       Other Visit Diagnoses    Need for immunization against influenza       Relevant Orders   Flu Vaccine QUAD 36+ mos IM (Completed)       Michel Bickers, MD [facility] for [facility] [phone] pager   [phone] cell 02/12/2019, 12:38 PM

## 2019-02-12 NOTE — Assessment & Plan Note (Signed)
I find no evidence of relapse of her Mycobacterium avium pneumonia.  She can follow-up here as needed.

## 2019-02-13 ENCOUNTER — Ambulatory Visit: Payer: Medicare Other | Admitting: Diagnostic Neuroimaging

## 2019-02-13 ENCOUNTER — Encounter: Payer: Self-pay | Admitting: Diagnostic Neuroimaging

## 2019-02-13 VITALS — BP 134/71 | HR 89 | Temp 97.1°F | Ht 65.0 in | Wt 189.6 lb

## 2019-02-13 DIAGNOSIS — R413 Other amnesia: Secondary | ICD-10-CM

## 2019-02-13 NOTE — Progress Notes (Signed)
[facility]  PATIENT: Teresa French DOB: February 25, 1941  REFERRING CLINICIAN: Sherri Sear HISTORY FROM: patient and daughter  REASON FOR VISIT: new consult    HISTORICAL  CHIEF COMPLAINT:  Chief Complaint  Patient presents with  . Memory change    rm 6 New Pt dgtrSharyn French  MMSE 24    HISTORY OF PRESENT ILLNESS:   78 year old female here for evaluation of memory loss.  Patient reports some mild short-term memory loss for past few months.  This is mainly noticed by family.  Patient was living in [place] independently and then developed DVT.  Patient came to live with daughter for past 6 weeks so that she could better take care of her.  Patient's daughter is a Designer, jewellery in [place].  Patient still able to maintain her ADLs, bathing, feeding, hygiene, dressing as well as household chores, shopping and driving.  Daughter is more concerned about her ability to live alone, but mainly related to possible future decline and living 2 hours away.   REVIEW OF SYSTEMS: Full 14 system review of systems performed and negative with exception of: As per HPI.  ALLERGIES: No Known Allergies  HOME MEDICATIONS: Outpatient Medications Prior to Visit  Medication Sig Dispense Refill  . albuterol (PROVENTIL HFA;VENTOLIN HFA) 108 (90 Base) MCG/ACT inhaler Inhale 2 puffs into the lungs every 4 (four) hours as needed for wheezing or shortness of breath.    Marland Kitchen amitriptyline (ELAVIL) 25 MG tablet Take 25 mg by mouth at bedtime.  3  . aspirin EC 81 MG tablet Take 81 mg by mouth daily.    . brimonidine (ALPHAGAN) 0.2 % ophthalmic solution     . Calcium Citrate-Vitamin D (CALCIUM CITRATE+D3 PETITES) 200-250 MG-UNIT TABS Take by mouth.    . celecoxib (CELEBREX) 100 MG capsule Take 1 capsule (100 mg total) by mouth daily as needed (1-2 tablets per day as needed). 90 capsule 3  . cetirizine (ZYRTEC) 10 MG tablet TK 1 T PO QD PRN    . chlorthalidone (HYGROTON) 25 MG tablet Take 25  mg by mouth daily.    . fluticasone (FLONASE) 50 MCG/ACT nasal spray 1 spray by Each Nare route daily.    . Fluticasone-Salmeterol (ADVAIR) 100-50 MCG/DOSE AEPB Inhale 1 puff into the lungs daily. ONCE DAILY    . ibandronate (BONIVA) 150 MG tablet Take 150 mg by mouth every 30 (thirty) days. Take in the morning with a full glass of water, on an empty stomach, and do not take anything else by mouth or lie down for the next 30 min.    Marland Kitchen labetalol (NORMODYNE) 100 MG tablet     . latanoprost (XALATAN) 0.005 % ophthalmic solution Place 1 drop into both eyes at bedtime.    Marland Kitchen losartan (COZAAR) 25 MG tablet     . neomycin-polymyxin-hydrocortisone (CORTISPORIN) 3.5-10000-1 OTIC suspension SHAKE LQ AND INT 3 GTS IN LEFT EAR QID FOR 10 DAYS    . ondansetron (ZOFRAN) 4 MG tablet Take 1 tablet (4 mg total) by mouth every 8 (eight) hours as needed for nausea or vomiting. 30 tablet 3  . potassium chloride (KLOR-CON) 20 MEQ packet Take 20 mEq by mouth daily.    Marland Kitchen REFRESH LIQUIGEL 1 % GEL See admin instructions.    . rosuvastatin (CRESTOR) 20 MG tablet Take 20 mg by mouth daily.    Alveda Reasons 10 MG TABS tablet     . ZYLET 0.5-0.3 % SUSP INT 1 GTT INTO OU BID    .  azithromycin (ZITHROMAX) 500 MG tablet     . Calcium Citrate-Vitamin D (CALCIUM + D PO) Take 1 tablet by mouth daily.    . ciprofloxacin (CILOXAN) 0.3 % ophthalmic solution PLACE 1-2 DROPS INTO OU Q 6 H    . dorzolamide-timolol (COSOPT) 22.3-6.8 MG/ML ophthalmic solution Place 1 drop into both eyes 2 (two) times daily.    Marland Kitchen ethambutol (MYAMBUTOL) 400 MG tablet     . hydrochlorothiazide (HYDRODIURIL) 50 MG tablet Take 50 mg by mouth daily.    Marland Kitchen leflunomide (ARAVA) 20 MG tablet Take 20 mg by mouth daily.    Marland Kitchen NIFEdipine (PROCARDIA-XL/ADALAT-CC/NIFEDICAL-XL) 30 MG 24 hr tablet Take 60 mg by mouth every morning.     . pregabalin (LYRICA) 75 MG capsule Take 1 capsule (75 mg total) by mouth 2 (two) times daily. (Patient not taking: Reported on 05/21/2018) 60  capsule 5   No facility-administered medications prior to visit.     PAST MEDICAL HISTORY: Past Medical History:  Diagnosis Date  . Arthritis   . Asthma   . Borderline diabetes   . Breast mass, left 07/10/2017   06/19/17: mammos: lobulated mass sup & lat to nipple; [place]: 1.4x0.5x1.0 cm  Bi-RADS4  . Complication of anesthesia    "I SLEEP A VERY VERY LONG TIME"  . Eczema   . Environmental allergies   . Glaucoma   . History of transfusion   . HTN (hypertension), benign 07/10/2017  . Hyperlipidemia   . Hypertension   . Tingling    OF FEET    PAST SURGICAL HISTORY: Past Surgical History:  Procedure Laterality Date  . BREAST BIOPSY Bilateral    3-5 EACH BREAST  . BREAST CYST ASPIRATION    . CERVICAL LAMINECTOMY    . DILATION AND CURETTAGE OF UTERUS    . GANGLION CYST EXCISION     X3  . JOINT REPLACEMENT  2007   left hip  . TOTAL HIP ARTHROPLASTY Right 11/21/2014   Procedure: RIGHT TOTAL HIP ARTHROPLASTY ANTERIOR APPROACH;  Surgeon: Mcarthur Rossetti, MD;  Location: [facility];  Service: Orthopedics;  Laterality: Right;    FAMILY HISTORY: Family History  Problem Relation Age of Onset  . Breast cancer Paternal Aunt   . Breast cancer Cousin   . Breast cancer Cousin   . Heart disease Mother   . Lung cancer Father     SOCIAL HISTORY: Social History   Socioeconomic History  . Marital status: Divorced    Spouse name: Not on file  . Number of children: 1  . Years of education: Not on file  . Highest education level: Associate degree: academic program  Occupational History  . Not on file  Social Needs  . Financial resource strain: Not on file  . Food insecurity    Worry: Not on file    Inability: Not on file  . Transportation needs    Medical: Not on file    Non-medical: Not on file  Tobacco Use  . Smoking status: Never Smoker  . Smokeless tobacco: Never Used  Substance and Sexual Activity  . Alcohol use: No  . Drug use: No  . Sexual activity: Not on file   Lifestyle  . Physical activity    Days per week: Not on file    Minutes per session: Not on file  . Stress: Not on file  Relationships  . Social Herbalist on phone: Not on file    Gets together: Not on file  Attends religious service: Not on file    Active member of club or organization: Not on file    Attends meetings of clubs or organizations: Not on file    Relationship status: Not on file  . Intimate partner violence    Fear of current or ex partner: Not on file    Emotionally abused: Not on file    Physically abused: Not on file    Forced sexual activity: Not on file  Other Topics Concern  . Not on file  Social History Narrative   02/13/19 lives alone   Caffeine- coffee 1 cup     PHYSICAL EXAM  GENERAL EXAM/CONSTITUTIONAL: Vitals:  Vitals:   02/13/19 1127  BP: 134/71  Pulse: 89  Temp: (!) 97.1 F (36.2 C)  Weight: 189 lb 9.6 oz (86 kg)  Height: 5\' 5"  (1.651 m)     Body mass index is 31.55 kg/m. Wt Readings from Last 3 Encounters:  02/13/19 189 lb 9.6 oz (86 kg)  02/12/19 189 lb 3.2 oz (85.8 kg)  05/21/18 179 lb 1.9 oz (81.2 kg)     Patient is in no distress; well developed, nourished and groomed; neck is supple  CARDIOVASCULAR:  Examination of carotid arteries is normal; no carotid bruits  Regular rate and rhythm, no murmurs  Examination of peripheral vascular system by observation and palpation is normal  EYES:  Ophthalmoscopic exam of optic discs and posterior segments is normal; no papilledema or hemorrhages  No exam data present  MUSCULOSKELETAL:  Gait, strength, tone, movements noted in Neurologic exam below  NEUROLOGIC: MENTAL STATUS:  MMSE - [place] Exam 02/13/2019  Orientation to time 5  Orientation to Place 4  Registration 3  Attention/ Calculation 2  Recall 2  Language- name 2 objects 2  Language- repeat 0  Language- follow 3 step command 3  Language- read & follow direction 1  Write a sentence 1   Copy design 1  Total score 24    awake, alert, oriented to person, place and time  recent and remote memory intact  normal attention and concentration  language fluent, comprehension intact, naming intact  fund of knowledge appropriate  CRANIAL NERVE:   2nd - no papilledema on fundoscopic exam  2nd, 3rd, 4th, 6th - pupils equal and reactive to light, visual fields full to confrontation, extraocular muscles intact, no nystagmus  5th - facial sensation symmetric  7th - facial strength symmetric  8th - hearing intact  9th - palate elevates symmetrically, uvula midline  11th - shoulder shrug symmetric  12th - tongue protrusion midline  MOTOR:   normal bulk and tone, full strength in the BUE, BLE  SENSORY:   normal and symmetric to light touch, temperature, vibration  COORDINATION:   finger-nose-finger, fine finger movements normal  REFLEXES:   deep tendon reflexes TRACE and symmetric  GAIT/STATION:   narrow based gait     DIAGNOSTIC DATA (LABS, IMAGING, TESTING) - I reviewed patient records, labs, notes, testing and imaging myself where available.  Lab Results  Component Value Date   WBC 5.1 01/23/2018   HGB 12.3 01/23/2018   HCT 37.0 01/23/2018   MCV 85.6 01/23/2018   PLT 229 01/23/2018      Component Value Date/Time   NA 140 01/23/2018 1507   NA 140 07/10/2017 1457   K 3.7 01/23/2018 1507   CL 103 01/23/2018 1507   CO2 29 01/23/2018 1507   GLUCOSE 99 01/23/2018 1507   BUN 12 01/23/2018 1507  BUN 12 07/10/2017 1457   CREATININE 0.78 01/23/2018 1507   CALCIUM 9.5 01/23/2018 1507   PROT 7.2 01/23/2018 1507   PROT 7.5 07/10/2017 1457   ALBUMIN 4.5 07/10/2017 1457   AST 21 01/23/2018 1507   ALT 12 01/23/2018 1507   ALKPHOS 75 07/10/2017 1457   BILITOT 0.4 01/23/2018 1507   BILITOT 0.3 07/10/2017 1457   GFRNONAA 69 07/10/2017 1457   GFRAA 80 07/10/2017 1457   No results found for: CHOL, HDL, LDLCALC, LDLDIRECT, TRIG, CHOLHDL Lab  Results  Component Value Date   HGBA1C 6.2 (H) 01/23/2018   No results found for: VITAMINB12 No results found for: TSH   01/09/19 CT head 1. No acute intracranial abnormality. 2. Bilateral mastoid effusions. 3. Microvascular ischemic changes.   ASSESSMENT AND PLAN  78 y.o. year old female here with mild memory loss, like representing MCI, with no major changes in ADLs.  Check MRI of the brain to rule out other secondary causes.  Reviewed safety and supervision issues.  Dx:  1. Memory loss     PLAN:  MILD MEMORY LOSS (MMSE 24/30; no major changes in ADLs) - check MRI brain - safety / supervision issues reviewed - caregiver resources provided - caution with driving and finances  Orders Placed This Encounter  Procedures  . MR BRAIN W WO CONTRAST   Return for pending if symptoms worsen or fail to improve.    Penni Bombard, MD [id number], [id number] AM Certified in Neurology, Neurophysiology and Neuroimaging  [facility] [address] [place], [place] [address] [phone]

## 2019-03-04 ENCOUNTER — Ambulatory Visit
Admission: RE | Admit: 2019-03-04 | Discharge: 2019-03-04 | Disposition: A | Discharge status: Home / Self Care | Payer: Medicare Other | Source: Ambulatory Visit | Attending: Diagnostic Neuroimaging | Admitting: Diagnostic Neuroimaging

## 2019-03-04 DIAGNOSIS — R413 Other amnesia: Secondary | ICD-10-CM | POA: Diagnosis not present

## 2019-03-04 MED ORDER — GADOBENATE DIMEGLUMINE 529 MG/ML IV SOLN
17.0000 mL | Freq: Once | INTRAVENOUS | Status: AC | PRN
  Administered 2019-03-04: 10:00:00 17 mL via INTRAVENOUS

## 2019-03-19 ENCOUNTER — Telehealth: Payer: Self-pay | Admitting: Neurology

## 2019-03-19 NOTE — Telephone Encounter (Signed)
IMPRESSION:   MRI brain (with and without) demonstrating: - Mild periventricular and subcortical foci of non-specific gliosis.  - Bilateral mastoid air cell effusions.  - No acute findings.   Please call patient, MRI of the brain showed generalized atrophy, mild supratentorial and small vessel disease, evidence of bilateral mastoid air cell infusions, there was no acute abnormalities.

## 2019-03-19 NOTE — Telephone Encounter (Addendum)
Spoke with patient and informed her of MRI brain results. She asked me to call her daughter, [name]. Called [name] and LVM requesting a call back.

## 2019-04-01 ENCOUNTER — Ambulatory Visit: Payer: Medicare Other | Attending: Internal Medicine

## 2019-04-01 DIAGNOSIS — Z23 Encounter for immunization: Secondary | ICD-10-CM

## 2019-04-01 NOTE — Progress Notes (Signed)
   Covid-19 Vaccination Clinic  Name:  Teresa French    MRN: [id number] DOB: 03-Jun-1940  04/01/2019  [name] was observed post Covid-19 immunization for 15 minutes without incidence. She was provided with Vaccine Information Sheet and instruction to access the V-Safe system.   [name] was instructed to call 911 with any severe reactions post vaccine: Marland Kitchen Difficulty breathing  . Swelling of your face and throat  . A fast heartbeat  . A bad rash all over your body  . Dizziness and weakness    Immunizations Administered    Name Date Dose VIS Date Route   Pfizer COVID-19 Vaccine 04/01/2019 10:03 AM 0.3 mL 03/01/2019 Intramuscular   Manufacturer: [employer], [employer]   Lot: [id number]   [place]: [id number]

## 2019-04-05 ENCOUNTER — Ambulatory Visit: Payer: Medicare Other

## 2019-04-20 ENCOUNTER — Ambulatory Visit: Payer: Medicare PPO | Attending: Internal Medicine

## 2019-04-20 DIAGNOSIS — Z23 Encounter for immunization: Secondary | ICD-10-CM | POA: Insufficient documentation

## 2019-04-20 NOTE — Progress Notes (Signed)
   Covid-19 Vaccination Clinic  Name:  Teresa French    MRN: [id number] DOB: February 26, 1941  04/20/2019  Teresa French was observed post Covid-19 immunization for 15 minutes without incidence. She was provided with Vaccine Information Sheet and instruction to access the V-Safe system.   Teresa French was instructed to call 911 with any severe reactions post vaccine: Marland Kitchen Difficulty breathing  . Swelling of your face and throat  . A fast heartbeat  . A bad rash all over your body  . Dizziness and weakness    Immunizations Administered    Name Date Dose VIS Date Route   Pfizer COVID-19 Vaccine 04/20/2019 11:29 AM 0.3 mL 03/01/2019 Intramuscular   Manufacturer: [employer]   Lot: [id number]   [place]: [id number]

## 2019-04-21 ENCOUNTER — Ambulatory Visit: Payer: Medicare PPO

## 2019-11-13 ENCOUNTER — Other Ambulatory Visit: Payer: Self-pay | Admitting: Internal Medicine

## 2019-11-13 DIAGNOSIS — Z1231 Encounter for screening mammogram for malignant neoplasm of breast: Secondary | ICD-10-CM

## 2019-12-16 ENCOUNTER — Ambulatory Visit
Admission: RE | Admit: 2019-12-16 | Discharge: 2019-12-16 | Disposition: A | Discharge status: Home / Self Care | Payer: Medicare PPO | Source: Ambulatory Visit | Attending: Internal Medicine | Admitting: Internal Medicine

## 2019-12-16 ENCOUNTER — Other Ambulatory Visit: Payer: Self-pay

## 2019-12-16 DIAGNOSIS — Z1231 Encounter for screening mammogram for malignant neoplasm of breast: Secondary | ICD-10-CM

## 2019-12-20 ENCOUNTER — Ambulatory Visit: Payer: Medicare PPO

## 2019-12-27 ENCOUNTER — Ambulatory Visit (INDEPENDENT_AMBULATORY_CARE_PROVIDER_SITE_OTHER): Payer: Medicare PPO

## 2019-12-27 ENCOUNTER — Other Ambulatory Visit: Payer: Self-pay

## 2019-12-27 DIAGNOSIS — Z23 Encounter for immunization: Secondary | ICD-10-CM | POA: Diagnosis not present

## 2020-02-21 ENCOUNTER — Ambulatory Visit: Payer: Medicare PPO

## 2020-02-21 ENCOUNTER — Other Ambulatory Visit: Payer: Self-pay

## 2020-03-18 ENCOUNTER — Other Ambulatory Visit: Payer: Medicare PPO

## 2020-03-18 ENCOUNTER — Other Ambulatory Visit: Payer: Self-pay

## 2020-03-18 DIAGNOSIS — Z20822 Contact with and (suspected) exposure to covid-19: Secondary | ICD-10-CM

## 2020-03-18 LAB — POC COVID19 BINAXNOW: SARS Coronavirus 2 Ag: NEGATIVE

## 2020-03-18 NOTE — Progress Notes (Signed)
Patient presents with no symptoms only close family exposure. Patient has been vaccinated and booster received.

## 2020-03-24 ENCOUNTER — Other Ambulatory Visit: Payer: Self-pay

## 2020-03-24 ENCOUNTER — Encounter: Payer: Self-pay | Admitting: Internal Medicine

## 2020-03-24 ENCOUNTER — Telehealth: Payer: Self-pay

## 2020-03-24 ENCOUNTER — Telehealth (INDEPENDENT_AMBULATORY_CARE_PROVIDER_SITE_OTHER): Payer: Medicare PPO | Admitting: Internal Medicine

## 2020-03-24 VITALS — Temp 99.9°F

## 2020-03-24 DIAGNOSIS — J069 Acute upper respiratory infection, unspecified: Secondary | ICD-10-CM | POA: Diagnosis not present

## 2020-03-24 NOTE — Progress Notes (Signed)
Virtual Visit via Telephone Note  I connected with [name] on 03/24/20 at  4:00 PM EST by telephone and verified that I am speaking with the correct person using two identifiers.  Location: Patient: Home Provider: [facility]   I discussed the limitations, risks, security and privacy concerns of performing an evaluation and management service by telephone and the availability of in person appointments. I also discussed with the patient that there may be a patient responsible charge related to this service. The patient expressed understanding and agreed to proceed.   History of Present Illness: I called and spoke with [facility] today.  Her daughter Marcelino Duster participated in the call.  Marcelino Duster was diagnosed with Covid last week.  Although she and her mother are fully vaccinated, Akiya also became ill about 3 days ago.  She has underlying lung disease and a history of previously treated Mycobacterium avium pneumonia.  On New Year's Day she developed sore throat and runny nose.  She denies any change in her baseline shortness of breath but Marcelino Duster says she has had some labored breathing.  She says that her oxygen saturation has been as low as 90% up to 96% on room air.  Marcelino Duster believes that her normal was around 98%.  She only received her Pfizer booster vaccine 8 days ago before she became symptomatic.   Observations/Objective:   Assessment and Plan: She almost certainly has a breakthrough Covid infection  Follow Up Instructions: Face-to-face visit tomorrow for Covid testing and evaluation   I discussed the assessment and treatment plan with the patient. The patient was provided an opportunity to ask questions and all were answered. The patient agreed with the plan and demonstrated an understanding of the instructions.   The patient was advised to call back or seek an in-person evaluation if the symptoms worsen or if the condition fails to improve as anticipated.  I provided 16 minutes of  non-face-to-face time during this encounter.   Cliffton Asters, MD

## 2020-03-24 NOTE — Telephone Encounter (Signed)
Patient's daughter is calling to report her Mother is having symptoms of lack of sense of smell and taste, chills and productive cough.  Ms Neto has been exposed to COVID by her daughter who lives in the same household. They were wearing mask and using isolation precautions as best as possible.   Per Dr Orvan Falconer he would like a video visit with the patient which should accompany a oxygen saturation at room air.     I spoke with the daughter who has a pulse ox and will check oxygen saturations as well as oral temperature for scheduled video visit.   Video visit today at 4 pm with Dr Orvan Falconer.   Laurell Josephs, RN

## 2020-03-25 ENCOUNTER — Ambulatory Visit (INDEPENDENT_AMBULATORY_CARE_PROVIDER_SITE_OTHER): Payer: Medicare PPO | Admitting: Internal Medicine

## 2020-03-25 ENCOUNTER — Other Ambulatory Visit: Payer: Self-pay

## 2020-03-25 DIAGNOSIS — J069 Acute upper respiratory infection, unspecified: Secondary | ICD-10-CM

## 2020-03-25 DIAGNOSIS — Z20822 Contact with and (suspected) exposure to covid-19: Secondary | ICD-10-CM | POA: Diagnosis not present

## 2020-03-25 LAB — POC COVID19 BINAXNOW: SARS Coronavirus 2 Ag: POSITIVE — AB

## 2020-03-25 NOTE — Assessment & Plan Note (Signed)
I strongly suspect that she has breakthrough Covid infection but I do not see any significant pulmonary complications that would warrant going to the emergency department for outpatient therapy at this time.  We will obtain testing for Covid to confirm the diagnosis in case she has worsening symptoms and needs further evaluation or treatment.

## 2020-03-25 NOTE — Progress Notes (Signed)
[facility] for Infectious Disease  Patient Active Problem List   Diagnosis Date Noted  . Upper respiratory infection 02/14/2017    Priority: High  . 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 DVT (deep venous thrombosis) ([place]) 02/12/2019  . Mycobacterium infection 06/27/2018  . Breast mass, left 07/10/2017  . HTN (hypertension), benign 07/10/2017  . Peripheral neuropathy 03/08/2016  . Borderline diabetes   . Rheumatoid arthritis ([place]) 10/29/2015  . DJD (degenerative joint disease) 10/29/2015  . Osteoporosis 10/29/2015  . Osteoarthritis of right hip 11/21/2014  . 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   AMITRIPTYLINE (ELAVIL) 25 MG TABLET    Take 25 mg by mouth at bedtime.   ASPIRIN EC 81 MG TABLET    Take 81 mg by mouth daily.   BRIMONIDINE (ALPHAGAN) 0.2 % OPHTHALMIC SOLUTION       CALCIUM CITRATE-VITAMIN D (CALCIUM CITRATE+D3 PETITES) 200-250 MG-UNIT TABS    Take by mouth.   CELECOXIB (CELEBREX) 100 MG CAPSULE    Take 1 capsule (100 mg total) by mouth daily as needed (1-2 tablets per day as needed).   CETIRIZINE (ZYRTEC) 10 MG TABLET    TK 1 T PO QD PRN   CHLORTHALIDONE (HYGROTON) 25 MG TABLET    Take 25 mg by mouth daily.   FLUTICASONE (FLONASE) 50 MCG/ACT NASAL SPRAY    1 spray by Each Nare route daily.   FLUTICASONE-SALMETEROL (ADVAIR) 100-50 MCG/DOSE AEPB    Inhale 1 puff into the lungs daily. ONCE DAILY   IBANDRONATE (BONIVA) 150 MG TABLET    Take 150 mg by mouth every 30 (thirty) days. Take in the morning with a full glass of water, on an empty stomach, and do not take anything else by mouth or lie down for the next 30 min.   LABETALOL (NORMODYNE)  100 MG TABLET       LATANOPROST (XALATAN) 0.005 % OPHTHALMIC SOLUTION    Place 1 drop into both eyes at bedtime.   LOSARTAN (COZAAR) 25 MG TABLET       NEOMYCIN-POLYMYXIN-HYDROCORTISONE (CORTISPORIN) 3.5-10000-1 OTIC SUSPENSION    SHAKE LQ AND INT 3 GTS IN LEFT EAR QID FOR 10 DAYS   ONDANSETRON (ZOFRAN) 4 MG TABLET    Take 1 tablet (4 mg total) by mouth every 8 (eight) hours as needed for nausea or vomiting.   POTASSIUM CHLORIDE (KLOR-CON) 20 MEQ PACKET    Take 20 mEq by mouth daily.   REFRESH LIQUIGEL 1 % GEL    See admin instructions.   ROSUVASTATIN (CRESTOR) 20 MG TABLET    Take 20 mg by mouth daily.   XARELTO 10 MG TABS TABLET       ZYLET 0.5-0.3 % SUSP    INT 1 GTT INTO OU BID  Modified Medications   No medications on file  Discontinued Medications   No medications on file    Subjective: Rionna is in with her daughter for routine follow-up visit. Monsserrat became ill about 4 days ago.  She has underlying lung disease and a history of previously treated Mycobacterium avium pneumonia.  On New Year's Day she developed sore throat and  runny nose and lost her taste for food.    She and her daughter, Marcelino Duster, are fully vaccinated against Covid but Marcelino Duster developed Covid infection about 10 days ago.  They live together.  Following shows diagnosis Cloee tried to stay away from her in the house.  She started wearing a mask more frequently in the house.  She has noted some increased shortness of breath and dyspnea on exertion.  Her oxygen saturation has remained at 90% or above.  She says that she has had some slight increased cough productive of white sputum and some wheezing that gets better with her inhalers.  She is feeling a little bit better today.  She tells me that today "is the best day so far". Review of Systems: Review of Systems  Constitutional: Positive for malaise/fatigue. Negative for chills, diaphoresis and fever.  Respiratory: Positive for cough, sputum production, shortness of  breath and wheezing.   Cardiovascular: Negative for chest pain.    Past Medical History:  Diagnosis Date  . Arthritis   . Asthma   . Borderline diabetes   . Breast mass, left 07/10/2017   06/19/17: mammos: lobulated mass sup & lat to nipple; [place]: 1.4x0.5x1.0 cm  Bi-RADS4  . Complication of anesthesia    "I SLEEP A VERY VERY LONG TIME"  . Eczema   . Environmental allergies   . Glaucoma   . History of transfusion   . HTN (hypertension), benign 07/10/2017  . Hyperlipidemia   . Hypertension   . Tingling    OF FEET    Social History   Tobacco Use  . Smoking status: Never Smoker  . Smokeless tobacco: Never Used  Substance Use Topics  . Alcohol use: No  . Drug use: No    Family History  Problem Relation Age of Onset  . Breast cancer Paternal Aunt   . Breast cancer Cousin   . Breast cancer Cousin   . Heart disease Mother   . Lung cancer Father     No Known Allergies  Objective: There were no vitals filed for this visit. There is no height or weight on file to calculate BMI.  Physical Exam Constitutional:      Comments: Her spirits are good.  She appears comfortable and in no distress.  Cardiovascular:     Rate and Rhythm: Normal rate and regular rhythm.     Heart sounds: No murmur heard.   Pulmonary:     Effort: Pulmonary effort is normal.     Breath sounds: Rales present. No wheezing.     Comments: I walked her about 50 feet on level ground and her oxygen saturation remained at 93% or above.  Her heart rate went to 97. Psychiatric:        Mood and Affect: Mood normal.     Lab Results    Problem List Items Addressed This Visit      High   Upper respiratory infection    I strongly suspect that she has breakthrough Covid infection but I do not see any significant pulmonary complications that would warrant going to the emergency department for outpatient therapy at this time.  We will obtain testing for Covid to confirm the diagnosis in case she has worsening  symptoms and needs further evaluation or treatment.          Cliffton Asters, MD [facility] for Infectious Disease [facility] [phone] pager   [phone] cell 03/25/2020, 9:30 AM

## 2020-03-25 NOTE — Addendum Note (Signed)
Addended by: Laurell Josephs on: 03/25/2020 11:49 AM   Modules accepted: Orders

## 2020-03-27 LAB — SARS-COV-2 RNA,(COVID-19) QUALITATIVE NAAT: SARS CoV2 RNA: DETECTED — AB

## 2020-03-30 ENCOUNTER — Telehealth: Payer: Self-pay | Admitting: *Deleted

## 2020-03-30 NOTE — Telephone Encounter (Signed)
Per Dr Megan Salon, patient's covid test 1/5 was positive as expected. He would 1) like her to know the results so she can continue to quarantine and treat any symptoms appropriately, and 2) see how she is doing.  RN spoke with patient, relayed the results. She states this is as she also expected, and she is continuing to improve since her visit here 1/5. She would like to know when she is clear to return to her own home. Per patient, her daughter is also improving and has been cleared to return to work 1/11.  Please advise when patient should be able to return to her own home. Landis Gandy, RN

## 2020-03-31 NOTE — Telephone Encounter (Signed)
Relayed advice to [name]. Thanks!

## 2020-03-31 NOTE — Telephone Encounter (Signed)
She is now 11 days out from onset of illness and improving.  She may return home at any time.

## 2020-04-09 ENCOUNTER — Telehealth (INDEPENDENT_AMBULATORY_CARE_PROVIDER_SITE_OTHER): Payer: Medicare PPO | Admitting: Internal Medicine

## 2020-04-09 ENCOUNTER — Encounter: Payer: Self-pay | Admitting: Internal Medicine

## 2020-04-09 ENCOUNTER — Other Ambulatory Visit: Payer: Self-pay

## 2020-04-09 DIAGNOSIS — J069 Acute upper respiratory infection, unspecified: Secondary | ICD-10-CM | POA: Diagnosis not present

## 2020-04-09 MED ORDER — ALBUTEROL SULFATE HFA 108 (90 BASE) MCG/ACT IN AERS: 2.0000 | INHALATION_SPRAY | RESPIRATORY_TRACT | 1 refills | Status: DC | PRN

## 2020-04-09 MED ORDER — FLUTICASONE-SALMETEROL 100-50 MCG/DOSE IN AEPB: 1.0000 | INHALATION_SPRAY | Freq: Every day | RESPIRATORY_TRACT | 1 refills | Status: DC

## 2020-04-09 NOTE — Progress Notes (Signed)
Virtual Visit via Telephone Note  I connected with Teresa French on 04/09/20 at 10:30 AM EST by telephone and verified that I am speaking with the correct person using two identifiers.  Location: Patient: Home Provider: [facility]   I discussed the limitations, risks, security and privacy concerns of performing an evaluation and management service by telephone and the availability of in person appointments. I also discussed with the patient that there may be a patient responsible charge related to this service. The patient expressed understanding and agreed to proceed.   History of Present Illness: I called and spoke with Teresa French by phone today.  She developed a breakthrough COVID infection on 03/21/2020 after her daughter, who she lives with, developed COVID infection.  Both have been fully vaccinated.  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  Overall she is feeling better since she developed COVID symptoms but she is still noting some worsened cough productive of white sputum, some increased wheezing and shortness of breath.  She notes that her shortness of breath is most noticeable when she is wearing a mask.  She is no longer wearing a mask when at home.  She is out of both of her inhalers (Advair and Ventolin).  She is scheduled to see a new PCP (Dr. Maudry Mayhew) next month.  She wants to know when she can return home to [place].  She has had some discomfort and fullness in her left ear.  She tells me that she had bilateral tympanostomy tubes placed by an ENT doctor in [place] last year.   Observations/Objective:   Assessment and Plan: [name] is convalescing slowly after recent breakthrough COVID infection.  I will refill her Advair and Ventolin inhalers and asked her to review all of her medications with Dr. Delfina Redwood when she sees him next month.  I told her that she is free to return home to  [place] at any time.  I suggested that she contact her ENT physician to discuss her left ear symptoms. She does not need to remain in any COVID isolation.  She should continue to wear a mask when out among other people.  Follow Up Instructions: Follow up here as needed   I discussed the assessment and treatment plan with the patient. The patient was provided an opportunity to ask questions and all were answered. The patient agreed with the plan and demonstrated an understanding of the instructions.   The patient was advised to call back or seek an in-person evaluation if the symptoms worsen or if the condition fails to improve as anticipated.  I provided 15 minutes of non-face-to-face time during this encounter.   Michel Bickers, MD

## 2020-07-27 ENCOUNTER — Telehealth: Payer: Self-pay | Admitting: *Deleted

## 2020-07-27 NOTE — Telephone Encounter (Signed)
Per Dr Leta Baptist called daughter, [name] on [place] and scheduled patient for follow up re: memory. [name] will be with patient, verbalized understanding, appreciation.

## 2020-09-07 ENCOUNTER — Encounter: Payer: Self-pay | Admitting: Diagnostic Neuroimaging

## 2020-09-07 ENCOUNTER — Ambulatory Visit: Payer: Medicare PPO | Admitting: Diagnostic Neuroimaging

## 2020-09-07 VITALS — BP 157/82 | HR 81 | Ht 65.0 in | Wt 186.4 lb

## 2020-09-07 DIAGNOSIS — G3184 Mild cognitive impairment, so stated: Secondary | ICD-10-CM

## 2020-09-07 DIAGNOSIS — R413 Other amnesia: Secondary | ICD-10-CM

## 2020-09-07 NOTE — Progress Notes (Signed)
[facility]  PATIENT: Teresa French DOB: [birth year]  REFERRING CLINICIAN: Sherri Sear HISTORY FROM: patient and daughter [name], NP) REASON FOR VISIT: follow up    [place]:  Chief Complaint  Patient presents with   Memory Loss    Rm 7 FU requested by dgtr, [name]  MMSE 21    HISTORY OF PRESENT ILLNESS:   UPDATE (09/07/20, VRP): Since last visit, doing about the same. Some mild memory issues continue.  Symptoms are progressive. Severity is mild-moderate. No alleviating or aggravating factors. Tolerating meds.    PRIOR HPI (02/13/19): 80 year old female here for evaluation of memory loss.  Patient reports some mild short-term memory loss for past few months.  This is mainly noticed by family.  Patient was living in [place] independently and then developed DVT.  Patient came to live with daughter for past 6 weeks so that she could better take care of her.  Patient's daughter is a Designer, jewellery in [place].  Patient still able to maintain her ADLs, bathing, feeding, hygiene, dressing as well as household chores, shopping and driving.  Daughter is more concerned about her ability to live alone, but mainly related to possible future decline and living 2 hours away.   REVIEW OF SYSTEMS: Full 14 system review of systems performed and negative with exception of: As per HPI.  ALLERGIES: No Known Allergies  HOME MEDICATIONS: Outpatient Medications Prior to Visit  Medication Sig Dispense Refill   albuterol (VENTOLIN HFA) 108 (90 Base) MCG/ACT inhaler Inhale 2 puffs into the lungs every 4 (four) hours as needed for wheezing or shortness of breath. 18 g 1   amitriptyline (ELAVIL) 25 MG tablet Take 25 mg by mouth at bedtime.  3   aspirin EC 81 MG tablet Take 81 mg by mouth daily.     brimonidine (ALPHAGAN) 0.2 % ophthalmic solution      Calcium Citrate-Vitamin D (CALCIUM CITRATE+D3 PETITES) 200-250 MG-UNIT TABS Take by mouth.      celecoxib (CELEBREX) 100 MG capsule Take 1 capsule (100 mg total) by mouth daily as needed (1-2 tablets per day as needed). 90 capsule 3   cetirizine (ZYRTEC) 10 MG tablet TK 1 T PO QD PRN     chlorthalidone (HYGROTON) 25 MG tablet Take 25 mg by mouth daily.     Dorzolamide HCl-Timolol Mal PF 2-0.5 % SOLN      Fluticasone-Salmeterol (ADVAIR) 100-50 MCG/DOSE AEPB Inhale 1 puff into the lungs daily. ONCE DAILY 1 each 1   ibandronate (BONIVA) 150 MG tablet Take 150 mg by mouth every 30 (thirty) days. Take in the morning with a full glass of water, on an empty stomach, and do not take anything else by mouth or lie down for the next 30 min.     labetalol (NORMODYNE) 100 MG tablet      latanoprost (XALATAN) 0.005 % ophthalmic solution Place 1 drop into both eyes at bedtime.     losartan (COZAAR) 25 MG tablet      Multiple Vitamin (MULTIVITAMIN) tablet Take 1 tablet by mouth daily.     neomycin-polymyxin-hydrocortisone (CORTISPORIN) 3.5-10000-1 OTIC suspension      ondansetron (ZOFRAN) 4 MG tablet Take 1 tablet (4 mg total) by mouth every 8 (eight) hours as needed for nausea or vomiting. 30 tablet 3   potassium chloride (KLOR-CON) 20 MEQ packet Take 20 mEq by mouth daily.     REFRESH LIQUIGEL 1 % GEL See admin instructions.     rosuvastatin (CRESTOR) 20  MG tablet Take 20 mg by mouth daily.     ZYLET 0.5-0.3 % SUSP      fluticasone (FLONASE) 50 MCG/ACT nasal spray 1 spray by Each Nare route daily.     XARELTO 10 MG TABS tablet  (Patient not taking: No sig reported)     No facility-administered medications prior to visit.    PAST MEDICAL HISTORY: Past Medical History:  Diagnosis Date   Arthritis    Asthma    Borderline diabetes    Breast mass, left 07/10/2017   06/19/17: mammos: lobulated mass sup & lat to nipple; [place]: 1.4x0.5x1.0 cm  Bi-RADS4   Complication of anesthesia    "I SLEEP A VERY VERY LONG TIME"   Eczema    Environmental allergies    Glaucoma    History of transfusion    HTN  (hypertension), benign 07/10/2017   Hyperlipidemia    Hypertension    Tingling    OF FEET    PAST SURGICAL HISTORY: Past Surgical History:  Procedure Laterality Date   BREAST BIOPSY Bilateral    3-5 EACH BREAST   BREAST CYST ASPIRATION     CERVICAL LAMINECTOMY     DILATION AND CURETTAGE OF UTERUS     GANGLION CYST EXCISION     X3   JOINT REPLACEMENT  2007   left hip   TOTAL HIP ARTHROPLASTY Right 11/21/2014   Procedure: RIGHT TOTAL HIP ARTHROPLASTY ANTERIOR APPROACH;  Surgeon: Mcarthur Rossetti, MD;  Location: [facility];  Service: Orthopedics;  Laterality: Right;    FAMILY HISTORY: Family History  Problem Relation Age of Onset   Breast cancer Paternal 6    Breast cancer Cousin    Breast cancer Cousin    Heart disease Mother    Lung cancer Father     SOCIAL HISTORY: Social History   Socioeconomic History   Marital status: Divorced    Spouse name: Not on file   Number of children: 1   Years of education: Not on file   Highest education level: Associate degree: academic program  Occupational History   Not on file  Tobacco Use   Smoking status: Never   Smokeless tobacco: Never  Substance and Sexual Activity   Alcohol use: No   Drug use: No   Sexual activity: Not on file  Other Topics Concern   Not on file  Social History Narrative   09/07/20  lives alone   Caffeine- coffee 1 cup   Social Determinants of Health   Financial Resource Strain: Not on file  Food Insecurity: Not on file  Transportation Needs: Not on file  Physical Activity: Not on file  Stress: Not on file  Social Connections: Not on file  Intimate Partner Violence: Not on file     PHYSICAL EXAM  GENERAL EXAM/CONSTITUTIONAL: Vitals:  Vitals:   09/07/20 1630  BP: (!) 157/82  Pulse: 81  Weight: 186 lb 6.4 oz (84.6 kg)  Height: 5\' 5"  (1.651 m)   Body mass index is 31.02 kg/m. Wt Readings from Last 3 Encounters:  09/07/20 186 lb 6.4 oz (84.6 kg)  02/13/19 189 lb 9.6 oz (86 kg)   02/12/19 189 lb 3.2 oz (85.8 kg)   Patient is in no distress; well developed, nourished and groomed; neck is supple  CARDIOVASCULAR: Examination of carotid arteries is normal; no carotid bruits Regular rate and rhythm, no murmurs Examination of peripheral vascular system by observation and palpation is normal  EYES: Ophthalmoscopic exam of optic discs and posterior segments  is normal; no papilledema or hemorrhages No results found.  MUSCULOSKELETAL: Gait, strength, tone, movements noted in Neurologic exam below  NEUROLOGIC: MENTAL STATUS:  MMSE - [place] Exam 09/07/2020 02/13/2019  Orientation to time 5 5  Orientation to Place 4 4  Registration 3 3  Attention/ Calculation 0 2  Recall 1 2  Language- name 2 objects 2 2  Language- repeat 0 0  Language- follow 3 step command 3 3  Language- read & follow direction 1 1  Write a sentence 1 1  Copy design 1 1  Total score 21 24   awake, alert, oriented to person, place and time recent and remote memory intact normal attention and concentration language fluent, comprehension intact, naming intact fund of knowledge appropriate  CRANIAL NERVE:  2nd - no papilledema on fundoscopic exam 2nd, 3rd, 4th, 6th - pupils equal and reactive to light, visual fields full to confrontation, extraocular muscles intact, no nystagmus 5th - facial sensation symmetric 7th - facial strength symmetric 8th - hearing intact 9th - palate elevates symmetrically, uvula midline 11th - shoulder shrug symmetric 12th - tongue protrusion midline  MOTOR:  normal bulk and tone, full strength in the BUE, BLE  SENSORY:  normal and symmetric to light touch, temperature, vibration  COORDINATION:  finger-nose-finger, fine finger movements normal  REFLEXES:  deep tendon reflexes TRACE and symmetric  GAIT/STATION:  narrow based gait     DIAGNOSTIC DATA (LABS, IMAGING, TESTING) - I reviewed patient records, labs, notes, testing and imaging  myself where available.  Lab Results  Component Value Date   WBC 5.1 01/23/2018   HGB 12.3 01/23/2018   HCT 37.0 01/23/2018   MCV 85.6 01/23/2018   PLT 229 01/23/2018      Component Value Date/Time   NA 140 01/23/2018 1507   NA 140 07/10/2017 1457   K 3.7 01/23/2018 1507   CL 103 01/23/2018 1507   CO2 29 01/23/2018 1507   GLUCOSE 99 01/23/2018 1507   BUN 12 01/23/2018 1507   BUN 12 07/10/2017 1457   CREATININE 0.78 01/23/2018 1507   CALCIUM 9.5 01/23/2018 1507   PROT 7.2 01/23/2018 1507   PROT 7.5 07/10/2017 1457   ALBUMIN 4.5 07/10/2017 1457   AST 21 01/23/2018 1507   ALT 12 01/23/2018 1507   ALKPHOS 75 07/10/2017 1457   BILITOT 0.4 01/23/2018 1507   BILITOT 0.3 07/10/2017 1457   GFRNONAA 69 07/10/2017 1457   GFRAA 80 07/10/2017 1457   No results found for: CHOL, HDL, LDLCALC, LDLDIRECT, TRIG, CHOLHDL Lab Results  Component Value Date   HGBA1C 6.2 (H) 01/23/2018   No results found for: VITAMINB12 No results found for: TSH   01/09/19 CT head 1.  No acute intracranial abnormality. 2.  Bilateral mastoid effusions. 3.  Microvascular ischemic changes.  03/04/19 MRI brain (with and without) demonstrating: - Mild periventricular and subcortical foci of non-specific gliosis. - Bilateral mastoid air cell effusions. - No acute findings.    ASSESSMENT AND PLAN  80 y.o. year old female here with mild memory loss, like representing MCI, with no major changes in ADLs. Reviewed safety and supervision issues.  Dx:  1. Memory loss   2. MCI (mild cognitive impairment)      PLAN:  MILD MEMORY LOSS (MMSE 21/30; no major changes in ADLs) - safety / supervision issues reviewed - caregiver resources provided - caution with living alone, driving and finances  Return for return to PCP, pending if symptoms worsen or fail to  improve.    Penni Bombard, MD 09/14/9483, 4:62 PM Certified in Neurology, Neurophysiology and Neuroimaging  [facility] [address] [place], [place] [address] [phone]

## 2020-09-07 NOTE — Patient Instructions (Signed)
-   safety / supervision issues reviewed - caregiver resources provided - caution with living alone, driving and finances

## 2020-09-08 ENCOUNTER — Ambulatory Visit (INDEPENDENT_AMBULATORY_CARE_PROVIDER_SITE_OTHER): Payer: Medicare PPO

## 2020-09-08 ENCOUNTER — Encounter: Payer: Self-pay | Admitting: Internal Medicine

## 2020-09-08 ENCOUNTER — Other Ambulatory Visit (HOSPITAL_COMMUNITY): Payer: Self-pay

## 2020-09-08 ENCOUNTER — Ambulatory Visit: Payer: Medicare PPO | Admitting: Internal Medicine

## 2020-09-08 ENCOUNTER — Other Ambulatory Visit: Payer: Self-pay

## 2020-09-08 VITALS — BP 128/64 | HR 97 | Temp 98.1°F | Ht 65.0 in | Wt 188.0 lb

## 2020-09-08 DIAGNOSIS — G4733 Obstructive sleep apnea (adult) (pediatric): Secondary | ICD-10-CM | POA: Insufficient documentation

## 2020-09-08 DIAGNOSIS — J45909 Unspecified asthma, uncomplicated: Secondary | ICD-10-CM | POA: Insufficient documentation

## 2020-09-08 DIAGNOSIS — J4541 Moderate persistent asthma with (acute) exacerbation: Secondary | ICD-10-CM

## 2020-09-08 DIAGNOSIS — J42 Unspecified chronic bronchitis: Secondary | ICD-10-CM | POA: Diagnosis not present

## 2020-09-08 DIAGNOSIS — J209 Acute bronchitis, unspecified: Secondary | ICD-10-CM

## 2020-09-08 DIAGNOSIS — R0683 Snoring: Secondary | ICD-10-CM | POA: Insufficient documentation

## 2020-09-08 DIAGNOSIS — A319 Mycobacterial infection, unspecified: Secondary | ICD-10-CM

## 2020-09-08 MED ORDER — BUDESONIDE-FORMOTEROL FUMARATE 160-4.5 MCG/ACT IN AERO
2.0000 | INHALATION_SPRAY | Freq: Two times a day (BID) | RESPIRATORY_TRACT | 6 refills | Status: DC
  Filled 2020-09-08: qty 10.2, 30d supply, fill #0

## 2020-09-08 MED ORDER — DOXYCYCLINE HYCLATE 100 MG PO TABS
ORAL_TABLET | ORAL | 0 refills | Status: DC
  Filled 2020-09-08: qty 8, 7d supply, fill #0

## 2020-09-08 NOTE — Assessment & Plan Note (Signed)
Long palate. Falls asleep easily in chair. Daughter reports loud snore. Plan- sleep study. No f/u yet scheduled for this.

## 2020-09-08 NOTE — Patient Instructions (Signed)
Order- CXR     dx Bronchitis exacerbation  Order- Aerochamber   use  with Symbicort  Script sent to change Advair inhaler to Symbicort   inhale 2 puffs through spacer tube, then rinse mouth, twice every day  Order- schedule home sleep test     dx snoring  Please call us for results and recommendations about 2 weeks after the sleep test.

## 2020-09-08 NOTE — Progress Notes (Signed)
09/08/20- 43 yoF never smoker, referred by her daughter, Teresa Mire, NP, with concern of cough. Medical problem list includes Bronchiectasis/ hx MAIC, HTN, DVT, Peripheral Neuropathy, Rheumatoid Arthritis , Osteoporosis, Hyperlipidemia, Glaucoma, L Breast Mass, Environmental Allergies, Memory Loss, Covid Infection Jan 2022,  -Advair100,  [facility] is in [place] but living here currently with daughter. Had video visit with D Campbell/ ID in January about her Covid infection.  Had tympanostomy tubes from ENT in [place].  Sputum culture Neg for [facility] in 2020- presumed cured.      Daughter is here -----Sob with exertion.  Coughing with cream colored mucous.  Symptoms for 3 weeks. Noted fatigue and lost taste with Covid last winter. Apparently improved, but has been seen by Neurology for dementia work-up. In last 3 weeks, more aware of DOE on stairs, cough productive cream -colored. Not much fever/ chill or adenopathy. Says Advair makes her sleepy- inconsistent use. History of snoring, daytime tiredness> decision to get sleep study. CXR 01/23/18- IMPRESSION: No active cardiopulmonary disease.  Prior to Admission medications   Medication Sig Start Date End Date Taking? Authorizing Provider  albuterol (VENTOLIN HFA) 108 (90 Base) MCG/ACT inhaler Inhale 2 puffs into the lungs every 4 (four) hours as needed for wheezing or shortness of breath. 04/09/20  Yes Michel Bickers, MD  amitriptyline (ELAVIL) 25 MG tablet Take 25 mg by mouth at bedtime. 02/03/18  Yes [provider]  aspirin EC 81 MG tablet Take 81 mg by mouth daily.   Yes [provider]  brimonidine (ALPHAGAN) 0.2 % ophthalmic solution  10/19/15  Yes [provider]  budesonide-formoterol (SYMBICORT) 160-4.5 MCG/ACT inhaler Inhale 2 puffs into the lungs through spacer 2 (two) times daily then rinse mouth. 09/08/20  Yes Amairani Shuey D, MD  Calcium Citrate-Vitamin D (CALCIUM CITRATE+D3 PETITES) 200-250  MG-UNIT TABS Take by mouth.   Yes [provider]  celecoxib (CELEBREX) 100 MG capsule Take 1 capsule (100 mg total) by mouth daily as needed (1-2 tablets per day as needed). 03/08/16  Yes Pete Pelt, PA-C  chlorthalidone (HYGROTON) 25 MG tablet Take 25 mg by mouth daily. 12/28/18  Yes [provider]  Dorzolamide HCl-Timolol Mal PF 2-0.5 % SOLN  01/14/20  Yes [provider]  doxycycline (VIBRA-TABS) 100 MG tablet Take 2 tablets by mouth today and then 1 tablet daily. 09/08/20  Yes Rayleen Wyrick, Tarri Fuller D, MD  ibandronate (BONIVA) 150 MG tablet Take 150 mg by mouth every 30 (thirty) days. Take in the morning with a full glass of water, on an empty stomach, and do not take anything else by mouth or lie down for the next 30 min.   Yes [provider]  labetalol (NORMODYNE) 100 MG tablet  09/19/18  Yes [provider]  latanoprost (XALATAN) 0.005 % ophthalmic solution Place 1 drop into both eyes at bedtime.   Yes [provider]  Multiple Vitamin (MULTIVITAMIN) tablet Take 1 tablet by mouth daily.   Yes [provider]  REFRESH LIQUIGEL 1 % GEL See admin instructions. 01/08/19  Yes [provider]  rosuvastatin (CRESTOR) 20 MG tablet Take 20 mg by mouth daily.   Yes [provider]  cetirizine (ZYRTEC) 10 MG tablet TK 1 T PO QD PRN Patient not taking: Reported on 09/08/2020 09/27/18   [provider]  fluticasone (FLONASE) 50 MCG/ACT nasal spray 1 spray by Each Nare route daily. 04/02/18 04/09/20  [provider]  losartan (COZAAR) 25 MG tablet  01/08/19   [provider]  neomycin-polymyxin-hydrocortisone (CORTISPORIN) 3.5-10000-1 OTIC suspension  11/05/18   [provider]  potassium chloride (KLOR-CON) 20 MEQ packet Take 20 mEq by mouth daily. Patient not taking: Reported on 09/08/2020    [provider]   Past Surgical History:  Procedure Laterality Date   BREAST BIOPSY Bilateral     3-5 EACH BREAST   BREAST CYST ASPIRATION     CERVICAL LAMINECTOMY     DILATION AND CURETTAGE OF UTERUS     GANGLION CYST EXCISION     X3   JOINT REPLACEMENT  2007   left hip   TOTAL HIP ARTHROPLASTY Right 11/21/2014   Procedure: RIGHT TOTAL HIP ARTHROPLASTY ANTERIOR APPROACH;  Surgeon: Mcarthur Rossetti, MD;  Location: [facility];  Service: Orthopedics;  Laterality: Right;   Past Medical History:  Diagnosis Date   Arthritis    Asthma    Borderline diabetes    Breast mass, left 07/10/2017   06/19/17: mammos: lobulated mass sup & lat to nipple; [place]: 1.4x0.5x1.0 cm  Bi-RADS4   Complication of anesthesia    "I SLEEP A VERY VERY LONG TIME"   Eczema    Environmental allergies    Glaucoma    History of transfusion    HTN (hypertension), benign 07/10/2017   Hyperlipidemia    Hypertension    Tingling    OF FEET   Family History  Problem Relation Age of Onset   Breast cancer Paternal Aunt    Breast cancer Cousin    Breast cancer Cousin    Heart disease Mother    Lung cancer Father    Social History   Socioeconomic History   Marital status: Divorced    Spouse name: Not on file   Number of children: 1   Years of education: Not on file   Highest education level: Associate degree: academic program  Occupational History   Not on file  Tobacco Use   Smoking status: Never   Smokeless tobacco: Never  Substance and Sexual Activity   Alcohol use: No   Drug use: No   Sexual activity: Not on file  Other Topics Concern   Not on file  Social History Narrative   09/07/20  lives alone   Caffeine- coffee 1 cup   Social Determinants of Health   Financial Resource Strain: Not on file  Food Insecurity: Not on file  Transportation Needs: Not on file  Physical Activity: Not on file  Stress: Not on file  Social Connections: Not on file  Intimate Partner Violence: Not on file   ROS-see HPI   + = positive Constitutional:    weight loss, night sweats, fevers, chills, +fatigue,  lassitude. HEENT:    headaches, difficulty swallowing, tooth/dental problems, sore throat,       +sneezing, itching, ear ache, +nasal congestion, post nasal drip, snoring CV:    chest pain, orthopnea, PND, +swelling in lower extremities, anasarca,                                  dizziness, palpitations Resp:   +shortness of breath with exertion or at rest.                +productive cough,   non-productive cough, coughing up of blood.              +change in color of mucus.  wheezing.   Skin:    +rash or lesions. GI:  No-   heartburn, indigestion, abdominal pain, nausea, vomiting, diarrhea,                 change in bowel habits, loss of appetite GU: dysuria, change in color of urine, no urgency or frequency.   flank pain. MS:   joint pain, stiffness, decreased range of motion, back pain. Neuro-     nothing unusual Psych:  change in mood or affect.  depression or anxiety.   memory loss.  OBJ- Physical Exam General- Alert, Oriented, Affect-appropriate, Distress- none acute Skin- rash-none, lesions- none, excoriation- none Lymphadenopathy- none Head- atraumatic            Eyes- Gross vision intact, PERRLA, conjunctivae and secretions clear            Ears- Hearing, canals-normal            Nose- Clear, no-Septal dev, mucus, polyps, erosion, perforation             Throat- Mallampati IV , mucosa clear , drainage- none, tonsils- atrophic, +teeth Neck- flexible , trachea midline, no stridor , thyroid nl, carotid no bruit Chest - symmetrical excursion , unlabored           Heart/CV- RRR , no murmur , no gallop  , no rub, nl s1 s2                           - JVD- none , edema- none, stasis changes- none, varices- none           Lung- +few crackles, wheeze- none, cough- none , dullness-none, rub- none           Chest wall-  Abd-  Br/ Gen/ Rectal- Not done, not indicated Extrem- cyanosis- none, clubbing, none, atrophy- none, strength- nl Neuro- grossly intact to observation

## 2020-09-08 NOTE — Assessment & Plan Note (Signed)
Negative culture 2020

## 2020-09-08 NOTE — Assessment & Plan Note (Addendum)
ID note referred to bronchiectasis, but I don't find documentation. MAIC apparently resolved. Current illness not clearly related to Covid infection last winter. Daughter thinks pt has trouble with hands activating Advair due to arthtritis. Plan- doxycycline, CXR, change Advair to Symbicort, spacer tube

## 2020-09-10 ENCOUNTER — Other Ambulatory Visit: Payer: Self-pay | Admitting: Internal Medicine

## 2020-09-10 DIAGNOSIS — J849 Interstitial pulmonary disease, unspecified: Secondary | ICD-10-CM

## 2020-09-10 NOTE — Progress Notes (Signed)
Pt notified of results and verbalized understanding. She was agreeable to HRCT and this was ordered.

## 2020-09-16 DIAGNOSIS — U071 COVID-19: Secondary | ICD-10-CM | POA: Insufficient documentation

## 2020-09-17 ENCOUNTER — Ambulatory Visit: Payer: Medicare PPO | Admitting: Internal Medicine

## 2020-09-17 ENCOUNTER — Other Ambulatory Visit: Payer: Self-pay

## 2020-09-17 DIAGNOSIS — Z8619 Personal history of other infectious and parasitic diseases: Secondary | ICD-10-CM

## 2020-09-17 DIAGNOSIS — U071 COVID-19: Secondary | ICD-10-CM | POA: Diagnosis not present

## 2020-09-17 NOTE — Progress Notes (Signed)
[facility] for Infectious Disease  Patient Active Problem List   Diagnosis Date Noted   COVID-19 09/16/2020    Priority: High   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    Priority: High   Asthmatic bronchitis 09/08/2020   Snoring 09/08/2020   DVT (deep venous thrombosis) ([place]) 02/12/2019   Mycobacterium infection 06/27/2018   Breast mass, left 07/10/2017   HTN (hypertension), benign 07/10/2017   Peripheral neuropathy 03/08/2016   Borderline diabetes    Rheumatoid arthritis ([place]) 10/29/2015   DJD (degenerative joint disease) 10/29/2015   Osteoporosis 10/29/2015   Osteoarthritis of right hip 11/21/2014   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   AMITRIPTYLINE (ELAVIL) 25 MG TABLET    Take 25 mg by mouth at bedtime.   ASPIRIN EC 81 MG TABLET    Take 81 mg by mouth daily.   BRIMONIDINE (ALPHAGAN) 0.2 % OPHTHALMIC SOLUTION       BUDESONIDE-FORMOTEROL (SYMBICORT) 160-4.5 MCG/ACT INHALER    Inhale 2 puffs into the lungs through spacer 2 (two) times daily then rinse mouth.   CALCIUM CITRATE-VITAMIN D (CALCIUM CITRATE+D3 PETITES) 200-250 MG-UNIT TABS    Take by mouth.   CELECOXIB (CELEBREX) 100 MG CAPSULE    Take 1 capsule (100 mg total) by mouth daily as needed (1-2 tablets per day as needed).   CETIRIZINE (ZYRTEC) 10 MG TABLET       CHLORTHALIDONE (HYGROTON) 25 MG TABLET    Take 25 mg by mouth daily.   DORZOLAMIDE HCL-TIMOLOL MAL PF 2-0.5 % SOLN       DOXYCYCLINE (VIBRA-TABS) 100 MG TABLET    Take 2 tablets by mouth today and then 1 tablet daily.   FLUTICASONE (FLONASE) 50 MCG/ACT NASAL SPRAY    1 spray by Each Nare route daily.   IBANDRONATE (BONIVA) 150 MG TABLET    Take 150 mg by mouth  every 30 (thirty) days. Take in the morning with a full glass of water, on an empty stomach, and do not take anything else by mouth or lie down for the next 30 min.   LABETALOL (NORMODYNE) 100 MG TABLET       LATANOPROST (XALATAN) 0.005 % OPHTHALMIC SOLUTION    Place 1 drop into both eyes at bedtime.   LOSARTAN (COZAAR) 25 MG TABLET       MULTIPLE VITAMIN (MULTIVITAMIN) TABLET    Take 1 tablet by mouth daily.   NEOMYCIN-POLYMYXIN-HYDROCORTISONE (CORTISPORIN) 3.5-10000-1 OTIC SUSPENSION       POTASSIUM CHLORIDE (KLOR-CON) 20 MEQ PACKET    Take 20 mEq by mouth daily.   REFRESH LIQUIGEL 1 % GEL    See admin instructions.   ROSUVASTATIN (CRESTOR) 20 MG TABLET    Take 20 mg by mouth daily.  Modified Medications   No medications on file  Discontinued Medications   No medications on file    Subjective: Teresa French is seen on a working basis.  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  She had COVID infection this past January but recovered uneventfully.  About 3 weeks ago she began to experience increased wheezing, shortness of breath and  cough productive of creamy yellow sputum.  She did not have any fever, chills or sweats.  Her daughter, Teresa French, tells me that she had not been using her long-acting inhaler consistently.  She was seen by Dr. Keturah Barre, her pulmonologist, on 09/08/2020.  There were no acute changes on her chest x-ray.  He started our prednisone and doxycycline which she completed recently.  Her Advair was changed to Symbicort.  She has been feeling much better over the past 3 days.  Her wheezing, shortness of breath and cough have decreased significantly.  She is scheduled for a chest CT scan on 09/29/2020.  Review of Systems: Review of Systems  Constitutional:  Negative for chills, diaphoresis and fever.  Respiratory:  Positive for cough, sputum production, shortness of breath and wheezing. Negative  for hemoptysis.   Cardiovascular:  Negative for chest pain.   Past Medical History:  Diagnosis Date   Arthritis    Asthma    Borderline diabetes    Breast mass, left 07/10/2017   06/19/17: mammos: lobulated mass sup & lat to nipple; [place]: 1.4x0.5x1.0 cm  Bi-RADS4   Complication of anesthesia    "I SLEEP A VERY VERY LONG TIME"   Eczema    Environmental allergies    Glaucoma    History of transfusion    HTN (hypertension), benign 07/10/2017   Hyperlipidemia    Hypertension    Tingling    OF FEET    Social History   Tobacco Use   Smoking status: Never   Smokeless tobacco: Never  Substance Use Topics   Alcohol use: No   Drug use: No    Family History  Problem Relation Age of Onset   Breast cancer Paternal Aunt    Breast cancer Cousin    Breast cancer Cousin    Heart disease Mother    Lung cancer Father     No Known Allergies  Objective: Vitals:   09/17/20 0926  BP: 117/81  Pulse: 81  Temp: 98.1 F (36.7 C)  TempSrc: Oral  SpO2: 97%  Weight: 190 lb 12.8 oz (86.5 kg)   Body mass index is 31.75 kg/m.  Physical Exam Constitutional:      Comments: She is pleasant and in no distress.  She speaks softly.  Cardiovascular:     Rate and Rhythm: Normal rate and regular rhythm.     Heart sounds: No murmur heard. Pulmonary:     Effort: Pulmonary effort is normal.     Breath sounds: Rales present. No wheezing or rhonchi.  Psychiatric:        Mood and Affect: Mood normal.    Lab Results    Problem List Items Addressed This Visit       High   History of Mycobacterium avium complex infection    She has improved with empiric therapy for acute asthmatic bronchitis.  I doubt that she has relapsed with her Mycobacterium avium infection but will obtain a sputum culture for AFB.  I will arrange a phone follow-up visit after her upcoming CT scan.       Relevant Orders   MYCOBACTERIA, CULTURE, WITH FLUOROCHROME SMEAR   COVID-19     Michel Bickers, MD [facility] for [facility]ious [place] [facility] [phone] pager   [phone] cell 09/17/2020, 9:50 AM

## 2020-09-17 NOTE — Assessment & Plan Note (Signed)
She has improved with empiric therapy for acute asthmatic bronchitis.  I doubt that she has relapsed with her Mycobacterium avium infection but will obtain a sputum culture for AFB.  I will arrange a phone follow-up visit after her upcoming CT scan.

## 2020-09-18 ENCOUNTER — Other Ambulatory Visit: Payer: Self-pay

## 2020-09-18 ENCOUNTER — Other Ambulatory Visit: Payer: Medicare PPO

## 2020-09-18 DIAGNOSIS — Z8619 Personal history of other infectious and parasitic diseases: Secondary | ICD-10-CM

## 2020-09-18 NOTE — Addendum Note (Signed)
Addended by: Caffie Pinto on: 09/18/2020 12:11 PM   Modules accepted: Orders

## 2020-09-22 ENCOUNTER — Telehealth: Payer: Self-pay

## 2020-09-22 NOTE — Telephone Encounter (Signed)
[facility] called to relay urgent results regarding preliminary mycobacteria culture. Results are available in Epic. Will route to provider.   Beryle Flock, RN

## 2020-09-22 NOTE — Telephone Encounter (Addendum)
Patient scheduled for phone visit 09/24/20 at 10:15. Attempted to call patient to make her aware, no answer. Left HIPAA compliant voicemail requesting callback.   Beryle Flock, RN

## 2020-09-22 NOTE — Telephone Encounter (Signed)
Spoke with [name] and relayed phone visit appointment time.   Beryle Flock, RN

## 2020-09-24 ENCOUNTER — Other Ambulatory Visit (HOSPITAL_COMMUNITY): Payer: Self-pay

## 2020-09-24 ENCOUNTER — Encounter: Payer: Self-pay | Admitting: Internal Medicine

## 2020-09-24 ENCOUNTER — Ambulatory Visit (INDEPENDENT_AMBULATORY_CARE_PROVIDER_SITE_OTHER): Payer: Medicare PPO | Admitting: Internal Medicine

## 2020-09-24 ENCOUNTER — Other Ambulatory Visit: Payer: Self-pay

## 2020-09-24 DIAGNOSIS — A310 Pulmonary mycobacterial infection: Secondary | ICD-10-CM | POA: Diagnosis not present

## 2020-09-24 DIAGNOSIS — Z8619 Personal history of other infectious and parasitic diseases: Secondary | ICD-10-CM

## 2020-09-24 MED ORDER — RIFAMPIN 300 MG PO CAPS
600.0000 mg | ORAL_CAPSULE | Freq: Every day | ORAL | 11 refills | Status: DC
  Filled 2020-09-24: qty 50, 25d supply, fill #0
  Filled 2020-09-24: qty 10, 5d supply, fill #0

## 2020-09-24 MED ORDER — ETHAMBUTOL HCL 400 MG PO TABS
800.0000 mg | ORAL_TABLET | Freq: Every day | ORAL | 11 refills | Status: DC
  Filled 2020-09-24: qty 60, 30d supply, fill #0

## 2020-09-24 MED ORDER — AZITHROMYCIN 500 MG PO TABS
500.0000 mg | ORAL_TABLET | Freq: Every day | ORAL | 11 refills | Status: DC
  Filled 2020-09-24: qty 30, 30d supply, fill #0

## 2020-09-24 NOTE — Progress Notes (Signed)
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   AMITRIPTYLINE (ELAVIL) 25 MG TABLET    Take 25 mg by mouth at bedtime.   ASPIRIN EC 81 MG TABLET    Take 81 mg by mouth daily.   BRIMONIDINE (ALPHAGAN) 0.2 % OPHTHALMIC SOLUTION       BUDESONIDE-FORMOTEROL (SYMBICORT) 160-4.5 MCG/ACT INHALER    Inhale 2 puffs into the lungs through spacer 2 (two) times daily then rinse mouth.   CALCIUM CITRATE-VITAMIN D (CALCIUM CITRATE+D3 PETITES) 200-250 MG-UNIT TABS    Take by mouth.   CELECOXIB (CELEBREX) 100 MG CAPSULE    Take 1 capsule (100 mg total) by mouth daily as needed (1-2 tablets per day as needed).   CETIRIZINE (ZYRTEC) 10 MG TABLET       CHLORTHALIDONE (HYGROTON) 25 MG TABLET    Take 25 mg by mouth daily.   DORZOLAMIDE HCL-TIMOLOL MAL PF 2-0.5 % SOLN       DOXYCYCLINE (VIBRA-TABS) 100 MG TABLET    Take 2 tablets by mouth today and then 1 tablet daily.   FLUTICASONE (FLONASE) 50 MCG/ACT NASAL SPRAY    1 spray by Each Nare route daily.   IBANDRONATE (BONIVA) 150 MG TABLET    Take 150 mg by mouth  every 30 (thirty) days. Take in the morning with a full glass of water, on an empty stomach, and do not take anything else by mouth or lie down for the next 30 min.   LABETALOL (NORMODYNE) 100 MG TABLET       LATANOPROST (XALATAN) 0.005 % OPHTHALMIC SOLUTION    Place 1 drop into both eyes at bedtime.   LOSARTAN (COZAAR) 25 MG TABLET       MULTIPLE VITAMIN (MULTIVITAMIN) TABLET    Take 1 tablet by mouth daily.   NEOMYCIN-POLYMYXIN-HYDROCORTISONE (CORTISPORIN) 3.5-10000-1 OTIC SUSPENSION       POTASSIUM CHLORIDE (KLOR-CON) 20 MEQ PACKET    Take 20 mEq by mouth daily.   REFRESH LIQUIGEL 1 % GEL    See admin instructions.   ROSUVASTATIN (CRESTOR) 20 MG TABLET    Take 20 mg by mouth daily.  Modified Medications   Modified Medication Previous Medication   AZITHROMYCIN (ZITHROMAX) 500 MG TABLET azithromycin (ZITHROMAX) 500 MG tablet      Take 1 tablet (500 mg total) by mouth daily.       ETHAMBUTOL (MYAMBUTOL) 400 MG TABLET ethambutol (MYAMBUTOL) 400 MG tablet      Take 2 tablets (800 mg total) by mouth daily.       RIFAMPIN (RIFADIN) 300  MG CAPSULE rifampin (RIFADIN) 300 MG capsule      Take 2 capsules (600 mg total) by mouth daily.    Take 2 capsules (600 mg total) by mouth 3 (three) times a week.  Discontinued Medications   No medications on file    Subjective: Teresa French is seen on a working basis.  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  She had COVID infection this past January but recovered uneventfully.  About 3 weeks ago she began to experience increased wheezing, shortness of breath and cough productive of creamy yellow sputum.  She did not have any fever, chills or sweats.  Her daughter, [name], tells me that she had not been using her long-acting inhaler consistently.  She was seen by Dr. Keturah Barre, her pulmonologist, on 09/08/2020.  He started our prednisone and doxycycline which she completed  recently.  Her Advair was changed to Symbicort.  She has been feeling much better over the past 3 days.  Her wheezing, shortness of breath and cough have decreased significantly.  Dr. Annamaria Boots obtained a CT scan which showed:  Peripheral predominant interstitial thickening, increased compared to 2019. Although this could represent the sequelae of asthma or chronic bronchitis in this nonsmoker, interstitial lung disease is a consideration. Consider further evaluation with high-resolution chest CT.  She is scheduled for a chest CT scan on 09/29/2020.  I ordered a repeat sputum AFB smear and culture last week.  Her smear is 1+ positive.  Review of Systems: Review of Systems  Constitutional:  Negative for chills, diaphoresis and fever.  Respiratory:  Positive for cough, sputum production and shortness of breath. Negative for hemoptysis and wheezing.   Cardiovascular:  Negative for chest pain.   Past Medical History:  Diagnosis Date   Arthritis    Asthma    Borderline diabetes    Breast mass, left 07/10/2017   06/19/17: mammos: lobulated mass sup & lat to nipple; [place]: 1.4x0.5x1.0 cm  Bi-RADS4   Complication of anesthesia    "I SLEEP A VERY VERY LONG TIME"   Eczema    Environmental allergies    Glaucoma    History of transfusion    HTN (hypertension), benign 07/10/2017   Hyperlipidemia    Hypertension    Tingling    OF FEET    Social History   Tobacco Use   Smoking status: Never   Smokeless tobacco: Never  Substance Use Topics   Alcohol use: No   Drug use: No    Family History  Problem Relation Age of Onset   Breast cancer Paternal 62    Breast cancer Cousin    Breast cancer Cousin    Heart disease Mother    Lung cancer Father     No Known Allergies  Objective: Vitals:   09/24/20 1028  BP: (!) 147/92  Temp: 98.6 F (37 C)  Weight: 190 lb (86.2 kg)  Height: _0  (1.676 m)   Body mass index is 30.67 kg/m.  Physical Exam Constitutional:      Comments: She is  pleasant and in no distress.  She is accompanied by her daughter.  Cardiovascular:     Rate and Rhythm: Normal rate and regular rhythm.     Heart sounds: No murmur heard. Pulmonary:     Effort: Pulmonary effort is normal.     Breath sounds: Rales present. No wheezing or rhonchi.  Psychiatric:  Mood and Affect: Mood normal.    Lab Results 09/18/2020 Sputum AFB smear 1+ positive; cultures pending   Problem List Items Addressed This Visit       High   History of Mycobacterium avium complex infection    I am certainly concerned that her Mycobacterium avium may have relapsed.  I discussed treatment options with Ms. Hebert and her daughter.  They are in agreement with restarting 3 drug therapy with azithromycin, ethambutol and rifampin.  Given that this is probably a relapse I will change her from 3 times weekly to daily dosing.  She will follow-up in 4 weeks.       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

## 2020-09-24 NOTE — Assessment & Plan Note (Signed)
I am certainly concerned that her Mycobacterium avium may have relapsed.  I discussed treatment options with Teresa French and her daughter.  They are in agreement with restarting 3 drug therapy with azithromycin, ethambutol and rifampin.  Given that this is probably a relapse I will change her from 3 times weekly to daily dosing.  She will follow-up in 4 weeks.

## 2020-09-25 ENCOUNTER — Other Ambulatory Visit (HOSPITAL_COMMUNITY): Payer: Self-pay

## 2020-09-25 MED ORDER — CELECOXIB 100 MG PO CAPS
100.0000 mg | ORAL_CAPSULE | Freq: Two times a day (BID) | ORAL | 0 refills | Status: DC
  Filled 2020-09-25: qty 30, 15d supply, fill #0

## 2020-09-29 ENCOUNTER — Ambulatory Visit
Admission: RE | Admit: 2020-09-29 | Discharge: 2020-09-29 | Disposition: A | Discharge status: Home / Self Care | Payer: Medicare PPO | Source: Ambulatory Visit | Attending: Internal Medicine | Admitting: Internal Medicine

## 2020-09-29 ENCOUNTER — Other Ambulatory Visit: Payer: Self-pay

## 2020-09-29 DIAGNOSIS — J849 Interstitial pulmonary disease, unspecified: Secondary | ICD-10-CM

## 2020-10-01 ENCOUNTER — Ambulatory Visit: Payer: Medicare PPO | Admitting: Internal Medicine

## 2020-10-09 ENCOUNTER — Telehealth: Payer: Self-pay

## 2020-10-09 NOTE — Telephone Encounter (Signed)
Almyra Free with [facility] called to report critical lab Mycobacteria Culture. Labs have already been reviewed by Dr. Megan Salon. Aljean Horiuchi T Brooks Sailors

## 2020-10-21 ENCOUNTER — Other Ambulatory Visit: Payer: Self-pay

## 2020-10-21 ENCOUNTER — Ambulatory Visit: Payer: Medicare PPO

## 2020-10-21 DIAGNOSIS — R0683 Snoring: Secondary | ICD-10-CM

## 2020-10-22 ENCOUNTER — Other Ambulatory Visit: Payer: Self-pay

## 2020-10-22 ENCOUNTER — Encounter: Payer: Self-pay | Admitting: Internal Medicine

## 2020-10-22 ENCOUNTER — Telehealth (INDEPENDENT_AMBULATORY_CARE_PROVIDER_SITE_OTHER): Payer: Medicare PPO | Admitting: Internal Medicine

## 2020-10-22 DIAGNOSIS — A318 Other mycobacterial infections: Secondary | ICD-10-CM

## 2020-10-22 DIAGNOSIS — A319 Mycobacterial infection, unspecified: Secondary | ICD-10-CM

## 2020-10-22 NOTE — Progress Notes (Signed)
Virtual Visit via Telephone Note  I connected with Teresa French on 10/22/20 at  9:30 AM EDT by telephone and verified that I am speaking with the correct person using two identifiers.  Location: Patient: Home Provider: [facility]   I discussed the limitations, risks, security and privacy concerns of performing an evaluation and management service by telephone and the availability of in person appointments. I also discussed with the patient that there may be a patient responsible charge related to this service. The patient expressed understanding and agreed to proceed.    History of Present Illness: I called and spoke with Teresa French and her daughter, Teresa French.  She started back on azithromycin, ethambutol and rifampin after her recent visit with me.  She has not had any problems tolerating it antibiotics but states that she is not feeling any better.  She remains extremely fatigued.  There has been no change in her chronic cough or shortness of breath. She underwent a recent chest CT scan which showed:  IMPRESSION: 1. 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 2. A few scattered nodular foci of peripheral peribronchovascular consolidation, largest 1.2 cm in the posterior right lower lobe, probably infectious/inflammatory such as due to MAI. Suggest attention on follow-up noncontrast chest CT in 3-6 months. 3. Scattered mild air trapping in both lungs, indicative of small airways disease. 4. Three-vessel coronary atherosclerosis. 5. Diffuse hepatic steatosis. 6. Aortic Atherosclerosis (ICD10-I70.0).   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  I told them that this can be due to asymptomatic colonization or it can cause smoldering pneumonia contributing to her recent slowly worsening symptoms.  I also told them that it is an extremely difficult bacteria to treat and generally requires placement of a PICC and long courses of multiple intravenous antibiotics that would require very close monitoring.  I told them that even with this most aggressive therapy it is often very difficult to cure.  They are obviously a little overwhelmed with this new information.  I asked him to think of what questions they might have.  They both felt that she would not be able to manage this in her own home alone.  Follow Up Instructions: Discontinue azithromycin, ethambutol and rifampin She will follow-up for annual in person visit on 10/28/2020   I discussed the assessment and treatment plan with the patient. The patient was provided an opportunity to ask questions and all were answered. The patient agreed with the plan and demonstrated an understanding of the instructions.   The patient was advised to call back or seek an in-person evaluation if the symptoms worsen or if the condition fails to improve as anticipated.  I provided 16 minutes of non-face-to-face time during this encounter.   Michel Bickers, MD

## 2020-10-28 ENCOUNTER — Ambulatory Visit: Payer: Medicare PPO | Admitting: Internal Medicine

## 2020-10-28 ENCOUNTER — Other Ambulatory Visit: Payer: Self-pay

## 2020-10-28 ENCOUNTER — Encounter: Payer: Self-pay | Admitting: Internal Medicine

## 2020-10-28 DIAGNOSIS — J479 Bronchiectasis, uncomplicated: Secondary | ICD-10-CM | POA: Insufficient documentation

## 2020-10-28 DIAGNOSIS — A318 Other mycobacterial infections: Secondary | ICD-10-CM

## 2020-10-28 DIAGNOSIS — A319 Mycobacterial infection, unspecified: Secondary | ICD-10-CM | POA: Diagnosis not present

## 2020-10-28 DIAGNOSIS — Z8619 Personal history of other infectious and parasitic diseases: Secondary | ICD-10-CM | POA: Diagnosis not present

## 2020-10-28 DIAGNOSIS — J470 Bronchiectasis with acute lower respiratory infection: Secondary | ICD-10-CM

## 2020-10-28 NOTE — Assessment & Plan Note (Signed)
I suggested that she touch base with Dr. Keturah Barre, her pulmonologist to follow-up her recent CT scan.  We will also help to have optimize treatment of her underlying lung disease and vigorous pulmonary hygiene.

## 2020-10-28 NOTE — Assessment & Plan Note (Signed)
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

## 2020-10-28 NOTE — Progress Notes (Signed)
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    Priority: High   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    Priority: High   COVID-19 09/16/2020   Asthmatic bronchitis 09/08/2020   Snoring 09/08/2020   DVT (deep venous thrombosis) ([place]) 02/12/2019   Breast mass, left 07/10/2017   HTN (hypertension), benign 07/10/2017   Peripheral neuropathy 03/08/2016   Borderline diabetes    Rheumatoid arthritis ([place]) 10/29/2015   DJD (degenerative joint disease) 10/29/2015   Osteoporosis 10/29/2015   Osteoarthritis of right hip 11/21/2014   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   AMITRIPTYLINE (ELAVIL) 25 MG TABLET    Take 25 mg by mouth at bedtime.   ASPIRIN EC 81 MG TABLET    Take 81 mg by mouth daily.   BRIMONIDINE (ALPHAGAN) 0.2 % OPHTHALMIC SOLUTION       BUDESONIDE-FORMOTEROL (SYMBICORT) 160-4.5 MCG/ACT INHALER    Inhale 2 puffs into the lungs through spacer 2 (two) times daily then rinse mouth.   CALCIUM CITRATE-VITAMIN D (CALCIUM CITRATE+D3 PETITES) 200-250 MG-UNIT TABS    Take by mouth.   CELECOXIB (CELEBREX) 100 MG CAPSULE    Take 1 capsule (100 mg total) by mouth daily as needed (1-2 tablets per day as needed).   CELECOXIB (CELEBREX) 100 MG CAPSULE    Take 1 capsule (100 mg total) by mouth 2 (two) times daily.   CETIRIZINE (ZYRTEC) 10 MG TABLET       CHLORTHALIDONE (HYGROTON) 25 MG TABLET    Take 25 mg by mouth daily.   DORZOLAMIDE HCL-TIMOLOL MAL PF 2-0.5 % SOLN       FLUTICASONE (FLONASE) 50 MCG/ACT NASAL SPRAY    1 spray by Each Nare route  daily.   IBANDRONATE (BONIVA) 150 MG TABLET    Take 150 mg by mouth every 30 (thirty) days. Take in the morning with a full glass of water, on an empty stomach, and do not take anything else by mouth or lie down for the next 30 min.   LABETALOL (NORMODYNE) 100 MG TABLET       LATANOPROST (XALATAN) 0.005 % OPHTHALMIC SOLUTION    Place 1 drop into both eyes at bedtime.   LOSARTAN (COZAAR) 25 MG TABLET       MULTIPLE VITAMIN (MULTIVITAMIN) TABLET    Take 1 tablet by mouth daily.   NEOMYCIN-POLYMYXIN-HYDROCORTISONE (CORTISPORIN) 3.5-10000-1 OTIC SUSPENSION       POTASSIUM CHLORIDE (KLOR-CON) 20 MEQ PACKET    Take 20 mEq by mouth daily.   REFRESH LIQUIGEL 1 % GEL    See admin instructions.   ROSUVASTATIN (CRESTOR) 20 MG TABLET    Take 20 mg by mouth daily.  Modified Medications   No medications on file  Discontinued Medications   AZITHROMYCIN (ZITHROMAX) 500 MG TABLET    Take 1 tablet (500 mg total) by mouth daily.   DOXYCYCLINE (VIBRA-TABS) 100 MG TABLET    Take 2 tablets by mouth today and then 1 tablet daily.   ETHAMBUTOL (MYAMBUTOL) 400 MG TABLET  Take 2 tablets (800 mg total) by mouth daily.   RIFAMPIN (RIFADIN) 300 MG CAPSULE    Take 2 capsules (600 mg total) by mouth daily.    Subjective: Ms. Deihl is in with her daughter to continue discussion about her recent sputum culture positive for Mycobacterium abscessus.  Chest CT scan on 09/29/2020 showed:  IMPRESSION: 1. 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 2. A few scattered nodular foci of peripheral peribronchovascular consolidation, largest 1.2 cm in the posterior right lower lobe, probably infectious/inflammatory such as due to MAI. Suggest attention on follow-up noncontrast chest CT in 3-6  months. 3. Scattered mild air trapping in both lungs, indicative of small airways disease. 4. Three-vessel coronary atherosclerosis. 5. Diffuse hepatic steatosis. 6. Aortic Atherosclerosis (ICD10-I70.0).  She describes having more dyspnea on exertion recently.  Her chronic cough is unchanged.  She says that she is actually bringing up less sputum recently.  She has not had any hemoptysis or fever.    Review of Systems: Review of Systems  Constitutional:  Negative for chills, fever and weight loss.  Respiratory:  Positive for cough, sputum production, shortness of breath and wheezing. Negative for hemoptysis.   Cardiovascular:  Negative for chest pain.   Past Medical History:  Diagnosis Date   Arthritis    Asthma    Borderline diabetes    Breast mass, left 07/10/2017   06/19/17: mammos: lobulated mass sup & lat to nipple; [place]: 1.4x0.5x1.0 cm  Bi-RADS4   Complication of anesthesia    "I SLEEP A VERY VERY LONG TIME"   Eczema    Environmental allergies    Glaucoma    History of transfusion    HTN (hypertension), benign 07/10/2017   Hyperlipidemia    Hypertension    Tingling    OF FEET    Social History   Tobacco Use   Smoking status: Never   Smokeless tobacco: Never  Substance Use Topics   Alcohol use: No   Drug use: No    Family History  Problem Relation Age of Onset   Breast cancer Paternal Aunt    Breast cancer Cousin    Breast cancer Cousin    Heart disease Mother    Lung cancer Father     No Known Allergies  Objective: Vitals:   10/28/20 1016  BP: (!) 176/75  Pulse: 90  Temp: 98.2 F (36.8 C)  SpO2: 99%  Weight: 188 lb (85.3 kg)   Body mass index is 30.34 kg/m.  Physical Exam Constitutional:      Comments: She is pleasant and in no distress.  She is somewhat hoarse and speaks in a whispered voice.  Cardiovascular:     Rate and Rhythm: Normal rate and regular rhythm.     Heart sounds: Murmur heard.     Comments: No change in her harsh 3/6  systolic murmur. Pulmonary:     Effort: Pulmonary effort is normal.     Breath sounds: Rales present. No wheezing or rhonchi.  Psychiatric:        Mood and Affect: Mood normal.    Lab Results 09/18/2020 Sputum AFB smear 1+ Sputum culture grew Mycobacterium abscessus (susceptibilities are pending)    Problem List Items Addressed This Visit       High   History of Mycobacterium avium complex infection  I suspect that her Mycobacterium avium infection has been cured but replaced by Mycobacterium abscessus.       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  I have reviewed the culture and CT findings with them again today.  I gave her daughter written information about treatment regimens for Mycobacterium abscessus and emphasized that the treatment regimens that are usually effective are very complicated and carry a greater than average risk for adverse side effects.  She tells me that she is interested in proceeding with treatment.  She and her daughter do not feel that she can manage this living alone and so the plan would be for her to stay here in [place] with her daughter who is considering FMLA.  I prefer to wait on susceptibility results before making a final decision on a regimen but she will probably be on the amikacin, imipenem and tigecycline.  She will need baseline labs and audiogram. She will follow-up here once I have susceptibility results.       Relevant Orders   MYCOBACTERIA, CULTURE, WITH FLUOROCHROME SMEAR   Bronchiectasis ([place])    I suggested that she touch base with Dr. Keturah Barre, her pulmonologist to follow-up her recent CT scan.  We will also help to have optimize treatment of her underlying lung disease and vigorous pulmonary hygiene.         Relevant Orders   Respiratory or Resp and Sputum Culture     Michel Bickers,  [facility] [place] [phone] pager   [phone] cell 10/28/2020, 1:39 PM

## 2020-10-28 NOTE — Assessment & Plan Note (Addendum)
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  I have reviewed the culture and CT findings with them again today.  I gave her daughter written information about treatment regimens for Mycobacterium abscessus and emphasized that the treatment regimens that are usually effective are very complicated and carry a greater than average risk for adverse side effects.  She tells me that she is interested in proceeding with treatment.  She and her daughter do not feel that she can manage this living alone and so the plan would be for her to stay here in [place] with her daughter who is considering FMLA.  I prefer to wait on susceptibility results before making a final decision on a regimen but she will probably be on the amikacin, imipenem and tigecycline.  She will need baseline labs and audiogram. She will follow-up here once I have susceptibility results.

## 2020-10-29 ENCOUNTER — Other Ambulatory Visit (HOSPITAL_COMMUNITY): Payer: Self-pay

## 2020-10-29 ENCOUNTER — Other Ambulatory Visit: Payer: Self-pay

## 2020-10-29 ENCOUNTER — Other Ambulatory Visit: Payer: Medicare PPO

## 2020-10-29 DIAGNOSIS — J470 Bronchiectasis with acute lower respiratory infection: Secondary | ICD-10-CM

## 2020-10-29 DIAGNOSIS — A318 Other mycobacterial infections: Secondary | ICD-10-CM

## 2020-10-29 DIAGNOSIS — A319 Mycobacterial infection, unspecified: Secondary | ICD-10-CM

## 2020-10-29 NOTE — Addendum Note (Signed)
Addended by: Caffie Pinto on: 10/29/2020 12:19 PM   Modules accepted: Orders

## 2020-10-30 ENCOUNTER — Other Ambulatory Visit: Payer: Self-pay

## 2020-10-30 ENCOUNTER — Ambulatory Visit: Payer: Medicare PPO

## 2020-10-30 DIAGNOSIS — G4733 Obstructive sleep apnea (adult) (pediatric): Secondary | ICD-10-CM | POA: Diagnosis not present

## 2020-11-02 ENCOUNTER — Telehealth: Payer: Self-pay

## 2020-11-02 ENCOUNTER — Other Ambulatory Visit (HOSPITAL_COMMUNITY): Payer: Self-pay

## 2020-11-02 DIAGNOSIS — A318 Other mycobacterial infections: Secondary | ICD-10-CM

## 2020-11-02 DIAGNOSIS — A319 Mycobacterial infection, unspecified: Secondary | ICD-10-CM

## 2020-11-02 NOTE — Telephone Encounter (Signed)
Received notification that respiratory sputum culture was contaminated and will need recollection. Spoke with patient, she states she can come pick up additional sputum sample kit today to repeat specimen.   Beryle Flock, RN

## 2020-11-03 ENCOUNTER — Other Ambulatory Visit: Payer: Medicare PPO

## 2020-11-03 ENCOUNTER — Other Ambulatory Visit: Payer: Self-pay

## 2020-11-03 DIAGNOSIS — G4733 Obstructive sleep apnea (adult) (pediatric): Secondary | ICD-10-CM | POA: Diagnosis not present

## 2020-11-03 DIAGNOSIS — A318 Other mycobacterial infections: Secondary | ICD-10-CM

## 2020-11-03 DIAGNOSIS — A319 Mycobacterial infection, unspecified: Secondary | ICD-10-CM

## 2020-11-03 NOTE — Progress Notes (Signed)
HPI F nebver smoker, mother of [name], NP, followed for Bronchiectasis/ MAIC/ M abscessus),  HTN, DVT, CAD, Aortic Atherosclerosis,  Peripheral Neuropathy, Rheumatoid Arthritis , Osteoporosis, Hyperlipidemia, Glaucoma, L Breast Mass, Environmental Allergies, Memory Loss, Covid Infection Jan 2022,  Sputum cx 09/18/20+ M. Abscessus. HST 11/01/20- AHI11.4/ hr, desaturation to 70%, body weight 188 lbs  ================================================================================  09/08/20- 79 yoF never smoker, referred by her daughter, [name], NP, with concern of cough. Medical problem list includes Bronchiectasis/ hx MAIC, HTN, DVT, Peripheral Neuropathy, Rheumatoid Arthritis , Osteoporosis, Hyperlipidemia, Glaucoma, L Breast Mass, Environmental Allergies, Memory Loss, Covid Infection Jan 2022,  -Advair100,  [facility] is in [place] but living here currently with daughter. Had video visit with D Campbell/ ID in January about her Covid infection.  Had tympanostomy tubes from ENT in [place].  Sputum culture Neg for [facility] in 2020- presumed cured.      Daughter is here -----Sob with exertion.  Coughing with cream colored mucous.  Symptoms for 3 weeks. Noted fatigue and lost taste with Covid last winter. Apparently improved, but has been seen by Neurology for dementia work-up. In last 3 weeks, more aware of DOE on stairs, cough productive cream -colored. Not much fever/ chill or adenopathy. Says Advair makes her sleepy- inconsistent use. History of snoring, daytime tiredness> decision to get sleep study. CXR 01/23/18- IMPRESSION: No active cardiopulmonary disease.  11/04/20- 66 yoF nebver smoker, mother of [name], NP, followed for Bronchiectasis/ MAIC/ M abscessus),  HTN, DVT, CAD, Aortic Atherosclerosis,  Peripheral Neuropathy, Rheumatoid Arthritis , Osteoporosis, Hyperlipidemia, Glaucoma, L Breast Mass, Environmental Allergies, Memory Loss, Covid Infection Jan 2022,   OSA (mild 11.4/hr),                                           -Symbicort 160,  Ventolin hfa,  Sputum cx 09/18/20+ M. Abscessus. Has consulted Dr Campbell/ ID with consideration of long term IV therapy. Recultured for confirmation 10/29/20. Dr Megan Salon asked her to f/u here for management of bronchiectasis At last visit there had ben concern of chronic fatigue and possible OSA HST 11/01/20- AHI11.4/ hr, desaturation to 70%, body weight 188 lbs  Body weight today-                                                                           Covid vax-    3 Phizer                                                Dtr [name] here -----Chronic Bronchitis, states increased DOE We discussed bronchiectasis, explaining terms and emphasizing pulmonary toilet. She denies reflux/ potential aspiration. No fever, chills or blood. Does occ note tussive bilateral lower lateral rib pains- transient. We are going to start with Flutter device. Potentially try pneumatic Vest later. Sleep study reviewed- mild OSA. She and daughter both emphasize snoring and daytime fatigue. They choose to treat rather than observe, and favor exploring oral appliance.  CT chest  09/30/20- IMPRESSION: 1. 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 2. A few scattered nodular foci of peripheral peribronchovascular consolidation, largest 1.2 cm in the posterior right lower lobe, probably infectious/inflammatory such as due to MAI. Suggest attention on follow-up noncontrast chest CT in 3-6 months. 3. Scattered mild air trapping in both lungs, indicative of small airways disease. 4. Three-vessel coronary atherosclerosis. 5. Diffuse hepatic steatosis. 6. Aortic Atherosclerosis (ICD10-I70.0).  ROS-see HPI    + = positive Constitutional:    weight loss, night sweats, fevers, chills, +fatigue, lassitude. HEENT:    headaches, difficulty swallowing, tooth/dental problems, sore throat,       +sneezing, itching, ear ache, +nasal congestion, post nasal drip, snoring CV:    chest pain, orthopnea, PND, +swelling in lower extremities, anasarca,                                   dizziness, palpitations Resp:   +shortness of breath with exertion or at rest.                +productive cough,   non-productive cough, coughing up of blood.              +change in color of mucus.  wheezing.   Skin:    +rash or lesions. GI:  No-   heartburn, indigestion, abdominal pain, nausea, vomiting, diarrhea,                 change in bowel habits, loss of appetite GU: dysuria, change in color of urine, no urgency or frequency.   flank pain. MS:   joint pain, stiffness, decreased range of motion, back pain. Neuro-     nothing unusual Psych:  change in mood or affect.  depression or anxiety.   memory loss.  OBJ- Physical Exam General- Alert, Oriented, Affect-appropriate, Distress- none acute, + obese Skin- rash-none, lesions- none, excoriation- none Lymphadenopathy- none Head- atraumatic            Eyes- Gross vision intact, PERRLA, conjunctivae and secretions clear            Ears- Hearing, canals-normal            Nose- Clear, no-Septal dev, mucus, polyps, erosion, perforation             Throat- Mallampati IV , mucosa clear , drainage- none, tonsils- atrophic, +teeth Neck- flexible , trachea midline, no stridor , thyroid nl, carotid no bruit Chest - symmetrical excursion , unlabored           Heart/CV- RRR , no murmur , no gallop  , no rub, nl s1 s2                           - JVD- none , edema- none, stasis changes- none, varices- none           Lung- +very c lear today wheeze- none, cough- none , dullness-none, rub- none           Chest wall-  Abd-  Br/ Gen/ Rectal- Not done, not indicated Extrem- cyanosis-  none, clubbing, none, atrophy- none, strength- nl Neuro- grossly intact to observation

## 2020-11-04 ENCOUNTER — Telehealth: Payer: Self-pay | Admitting: Internal Medicine

## 2020-11-04 ENCOUNTER — Encounter: Payer: Self-pay | Admitting: Internal Medicine

## 2020-11-04 ENCOUNTER — Ambulatory Visit: Payer: Medicare PPO | Admitting: Internal Medicine

## 2020-11-04 VITALS — BP 150/80 | HR 76 | Temp 98.3°F | Ht 65.0 in | Wt 189.8 lb

## 2020-11-04 DIAGNOSIS — J209 Acute bronchitis, unspecified: Secondary | ICD-10-CM

## 2020-11-04 DIAGNOSIS — J42 Unspecified chronic bronchitis: Secondary | ICD-10-CM | POA: Diagnosis not present

## 2020-11-04 DIAGNOSIS — G4733 Obstructive sleep apnea (adult) (pediatric): Secondary | ICD-10-CM | POA: Diagnosis not present

## 2020-11-04 DIAGNOSIS — J479 Bronchiectasis, uncomplicated: Secondary | ICD-10-CM

## 2020-11-04 DIAGNOSIS — J470 Bronchiectasis with acute lower respiratory infection: Secondary | ICD-10-CM | POA: Diagnosis not present

## 2020-11-04 NOTE — Telephone Encounter (Signed)
Documentation for order only      Ship broker is her daughter [name], NP)  Please order new DME     compressor nebulizer                                            Albuterol 0.083% neb solution # 150 ml   1 neb twice daily as needed  ref x 12  For dx Bronchiectasis without exacerbation

## 2020-11-04 NOTE — Telephone Encounter (Signed)
I sent in order for compressed neb machine and neb meds to DME company. ATC daughter [name] and left detailed message in accordance with DPR about orders and left callback number for any questions/concerns. Nothing further needed.

## 2020-11-04 NOTE — Assessment & Plan Note (Signed)
I suspect microaspiration over years, creating suitable environment for atypical infection. We will emphasize pulmonary toilet, starting with Flutter. Consider VEST later. She and daughter also ask for compressor nebulizer- we will start with albuterol Plan- Flutter device, nebulizer/ albuterol

## 2020-11-04 NOTE — Patient Instructions (Signed)
Order- Flutter device      This is to help keep mucus cleared out of your airways so the chronic bronchitis may get better            Blow through the Flutter 4 times per set, 3 sets per day  Order- referral to Dr Oneal Grout orthodontist   consider oral appliance for OSA  Please call if we can help

## 2020-11-04 NOTE — Assessment & Plan Note (Signed)
She considers daytime fatigue and snoring significant enough to treat. Chooses to explore oral appliance. Plan- referral to orthodontist for oral appliance

## 2020-11-06 ENCOUNTER — Other Ambulatory Visit (HOSPITAL_COMMUNITY): Payer: Self-pay

## 2020-11-06 ENCOUNTER — Telehealth: Payer: Self-pay | Admitting: Internal Medicine

## 2020-11-06 LAB — RESPIRATORY CULTURE OR RESPIRATORY AND SPUTUM CULTURE
MICRO NUMBER:: 12250365
RESULT:: NORMAL
SPECIMEN QUALITY:: ADEQUATE

## 2020-11-06 MED ORDER — ALBUTEROL SULFATE (2.5 MG/3ML) 0.083% IN NEBU
2.5000 mg | INHALATION_SOLUTION | Freq: Two times a day (BID) | RESPIRATORY_TRACT | 12 refills | Status: DC | PRN
  Filled 2020-11-06: qty 150, 25d supply, fill #0

## 2020-11-06 NOTE — Telephone Encounter (Signed)
Call made to patient, confirmed DOB. Patient states [facility] told her they will no longer fill nebulizer medications. Requesting order be sent to [facility] out patient pharmacy.   Aware medication has been sent. Voiced  understanding.   Nothing further needed at this time.

## 2020-11-14 LAB — SUSCEPT, AFB RAPID
Amikacin: 16 ug/mL
Ceoxitin: 64 ug/mL
Ciprofloxacin: >4 ug/mL
Clarithromycin: >16 ug/mL
Doxycycline: >8 ug/mL
Imipenem: 16 ug/mL
Linezolid: 32 ug/mL
Moxifloxxacin: >4 ug/mL
Tigecycline: 0.12 ug/mL

## 2020-11-14 LAB — MYCOBACTERIA,CULT W/FLUOROCHROME SMEAR
MICRO NUMBER:: 12076997
SPECIMEN QUALITY:: ADEQUATE

## 2020-11-14 LAB — TEST AUTHORIZATION

## 2020-11-14 LAB — CP MYCOBACTERIAL IDENTIFICATION

## 2020-11-17 ENCOUNTER — Telehealth: Payer: Self-pay

## 2020-11-17 NOTE — Telephone Encounter (Signed)
Called patient to schedule appointment per Dr. Megan Salon either 8/31 or 9/1. Spoke with Oris Drone who would like to call office back before scheduling appointment. She will need to confirm with her daughter what day/time works for her.  Leatrice Jewels, RMA

## 2020-11-17 NOTE — Telephone Encounter (Signed)
FYI:  Pt scheduled for follow up on 9/1 at 11:15. [name] (daughter) will try to come to appointment. Leatrice Jewels, RMA

## 2020-11-17 NOTE — Telephone Encounter (Signed)
-----   Message from Michel Bickers, MD sent at 11/17/2020 10:33 AM EDT ----- Please see if [name] can come in and see me tomorrow or Thursday.  The susceptibilities are back on her Mycobacterium abscessus isolate.  I need to readdress possible treatment regimens with her.  Thanks.  Jenny Reichmann

## 2020-11-19 ENCOUNTER — Other Ambulatory Visit: Payer: Self-pay

## 2020-11-19 ENCOUNTER — Ambulatory Visit: Payer: Medicare PPO | Admitting: Internal Medicine

## 2020-11-19 ENCOUNTER — Encounter: Payer: Self-pay | Admitting: Internal Medicine

## 2020-11-19 DIAGNOSIS — A318 Other mycobacterial infections: Secondary | ICD-10-CM

## 2020-11-19 DIAGNOSIS — A319 Mycobacterial infection, unspecified: Secondary | ICD-10-CM

## 2020-11-19 NOTE — Assessment & Plan Note (Signed)
I suspect that she has smoldering Mycobacterium abscessus pneumonia.  Her antibiotic susceptibilities have returned and as usual her isolate is resistant to all oral antibiotics tested.  It remains susceptible to amikacin and tigecycline.  It is only intermediately susceptible to imipenem and cefoxitin.  I reviewed the possibility of treatment with IV amikacin, tigecycline and imipenem with him again today.  Given that she has had some improvement in her symptoms I do not think that the potential benefit of that complicated and potentially toxic 3 drug IV regimen is worth the risk at this point.  They are in agreement with a cautious wait-and-see approach.  She will remain off of antibiotics for now and follow-up in 6 weeks.

## 2020-11-19 NOTE — Progress Notes (Signed)
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    Priority: High   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    Priority: High   COVID-19 09/16/2020   Asthmatic bronchitis 09/08/2020   OSA (obstructive sleep apnea) 09/08/2020   DVT (deep venous thrombosis) ([place]) 02/12/2019   Breast mass, left 07/10/2017   HTN (hypertension), benign 07/10/2017   Peripheral neuropathy 03/08/2016   Borderline diabetes    Rheumatoid arthritis ([place]) 10/29/2015   DJD (degenerative joint disease) 10/29/2015   Osteoporosis 10/29/2015   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   ALBUTEROL (VENTOLIN HFA) 108 (90 BASE) MCG/ACT INHALER    Inhale 2 puffs into the lungs every 4 (four) hours as needed for wheezing or shortness of breath.   AMITRIPTYLINE (ELAVIL) 25 MG TABLET    Take 25 mg by mouth at bedtime.   ASPIRIN EC 81 MG TABLET    Take 81 mg by mouth daily.   BRIMONIDINE (ALPHAGAN) 0.2 % OPHTHALMIC SOLUTION       BUDESONIDE-FORMOTEROL (SYMBICORT) 160-4.5 MCG/ACT INHALER    Inhale 2 puffs into the lungs through spacer 2 (two) times daily then rinse mouth.   CALCIUM CITRATE-VITAMIN D (CALCIUM CITRATE+D3 PETITES) 200-250 MG-UNIT TABS    Take by mouth.   CELECOXIB (CELEBREX) 100 MG CAPSULE    Take 1 capsule (100 mg total) by mouth 2 (two) times daily.   CETIRIZINE (ZYRTEC) 10 MG TABLET       CHLORTHALIDONE (HYGROTON) 25 MG TABLET    Take 25 mg by mouth daily.   DORZOLAMIDE HCL-TIMOLOL MAL PF 2-0.5 % SOLN        FLUTICASONE (FLONASE) 50 MCG/ACT NASAL SPRAY    1 spray by Each Nare route daily.   IBANDRONATE (BONIVA) 150 MG TABLET    Take 150 mg by mouth every 30 (thirty) days. Take in the morning with a full glass of water, on an empty stomach, and do not take anything else by mouth or lie down for the next 30 min.   LABETALOL (NORMODYNE) 100 MG TABLET       LATANOPROST (XALATAN) 0.005 % OPHTHALMIC SOLUTION    Place 1 drop into both eyes at bedtime.   LOSARTAN (COZAAR) 25 MG TABLET       MULTIPLE VITAMIN (MULTIVITAMIN) TABLET    Take 1 tablet by mouth daily.   NEOMYCIN-POLYMYXIN-HYDROCORTISONE (CORTISPORIN) 3.5-10000-1 OTIC SUSPENSION       POTASSIUM CHLORIDE (KLOR-CON) 20 MEQ PACKET    Take 20 mEq by mouth daily.   REFRESH LIQUIGEL 1 % GEL    See admin instructions.   ROSUVASTATIN (CRESTOR) 20 MG TABLET    Take 20 mg by mouth daily.  Modified Medications   No medications on file  Discontinued Medications   No medications on file    Subjective: Teresa French is in for her routine follow-up visit.  She is accompanied by her daughter, Teresa Mire, NP.  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  She recently had some worsening pulmonary symptoms and a chest CT scan showed evidence of bronchiectasis and new infiltrates.  A sputum culture obtained in July grew Mycobacterium abscessus.  A repeat sputum culture obtained 1 month ago is growing a nontuberculous mycobacteria that has not been speciated yet.  She saw her pulmonologist, Dr. Keturah Barre, 2 weeks ago.  He prescribed a flutter valve and nebulized albuterol.  She feels like these changes and a recent course of azithromycin have helped.  She says that her cough has improved and she is bringing up very little sputum now.  Her chronic dyspnea on exertion is unchanged.  Review of Systems: Review of Systems  Constitutional:  Negative for chills, diaphoresis, fever and weight loss.  Respiratory:  Positive for cough, sputum  production and shortness of breath. Negative for hemoptysis and wheezing.   Cardiovascular:  Negative for chest pain.   Past Medical History:  Diagnosis Date   Arthritis    Asthma    Borderline diabetes    Breast mass, left 07/10/2017   06/19/17: mammos: lobulated mass sup & lat to nipple; [place]: 1.4x0.5x1.0 cm  Bi-RADS4   Complication of anesthesia    "I SLEEP A VERY VERY LONG TIME"   Eczema    Environmental allergies    Glaucoma    History of transfusion    HTN (hypertension), benign 07/10/2017   Hyperlipidemia    Hypertension    Tingling    OF FEET    Social History   Tobacco Use   Smoking status: Never   Smokeless tobacco: Never  Substance Use Topics   Alcohol use: No   Drug use: No    Family History  Problem Relation Age of Onset   Breast cancer Paternal Aunt    Breast cancer Cousin    Breast cancer Cousin    Heart disease Mother    Lung cancer Father     No Known Allergies  Objective: Vitals:   11/19/20 1125  BP: (!) 141/81  Pulse: 72  Temp: 98.4 F (36.9 C)  TempSrc: Oral  Weight: 187 lb (84.8 kg)   Body mass index is 31.12 kg/m.  Physical Exam Constitutional:      Comments: She is somewhat hoarse and as usual soft-spoken.  Her spirits are good.  Cardiovascular:     Rate and Rhythm: Normal rate and regular rhythm.     Heart sounds: No murmur heard. Pulmonary:     Effort: Pulmonary effort is normal.     Breath sounds: No wheezing, rhonchi or rales.    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  Her antibiotic susceptibilities have returned and as usual her isolate is resistant to all oral antibiotics tested.  It remains susceptible to amikacin and tigecycline.  It is only intermediately susceptible to imipenem and cefoxitin.  I reviewed the possibility of treatment with IV amikacin, tigecycline and imipenem with  him again today.  Given that she has had some improvement in her symptoms I do not think that the potential benefit of that complicated and potentially toxic 3 drug IV regimen is worth the risk at this point.  They are in agreement with a cautious wait-and-see approach.  She will remain off of antibiotics for now and follow-up in 6 weeks.        Michel Bickers, MD [facility] [phone] pager   [phone] cell 11/19/2020, 12:39 PM

## 2020-12-09 ENCOUNTER — Other Ambulatory Visit (HOSPITAL_COMMUNITY): Payer: Self-pay

## 2020-12-10 ENCOUNTER — Other Ambulatory Visit: Payer: Self-pay | Admitting: Obstetrics and Gynecology

## 2020-12-10 ENCOUNTER — Other Ambulatory Visit: Payer: Self-pay | Admitting: Internal Medicine

## 2020-12-10 DIAGNOSIS — Z1231 Encounter for screening mammogram for malignant neoplasm of breast: Secondary | ICD-10-CM

## 2020-12-15 LAB — RESPIRATORY CULTURE OR RESPIRATORY AND SPUTUM CULTURE: MICRO NUMBER:: 12231944

## 2020-12-15 LAB — MYCOBACTERIA,CULT W/FLUOROCHROME SMEAR
MICRO NUMBER:: 12234811
SPECIMEN QUALITY:: ADEQUATE

## 2020-12-15 LAB — CP MYCOBACTERIAL IDENTIFICATION

## 2020-12-23 ENCOUNTER — Ambulatory Visit (INDEPENDENT_AMBULATORY_CARE_PROVIDER_SITE_OTHER): Payer: Medicare PPO

## 2020-12-23 ENCOUNTER — Ambulatory Visit (INDEPENDENT_AMBULATORY_CARE_PROVIDER_SITE_OTHER): Payer: Medicare PPO | Admitting: Internal Medicine

## 2020-12-23 ENCOUNTER — Other Ambulatory Visit (HOSPITAL_COMMUNITY): Payer: Self-pay

## 2020-12-23 ENCOUNTER — Other Ambulatory Visit: Payer: Self-pay

## 2020-12-23 ENCOUNTER — Encounter: Payer: Self-pay | Admitting: Internal Medicine

## 2020-12-23 DIAGNOSIS — Z23 Encounter for immunization: Secondary | ICD-10-CM

## 2020-12-23 DIAGNOSIS — A318 Other mycobacterial infections: Secondary | ICD-10-CM | POA: Diagnosis not present

## 2020-12-23 NOTE — Progress Notes (Signed)
   Covid-19 Vaccination Clinic  Name:  Teresa French    MRN: [id number] DOB: May 03, 1940  12/23/2020  [name] was observed post Covid-19 immunization for 15 minutes without incident. She was provided with Vaccine Information Sheet and instruction to access the V-Safe system.   [name] was instructed to call 911 with any severe reactions post vaccine: Difficulty breathing  Swelling of face and throat  A fast heartbeat  A bad rash all over body  Dizziness and weakness       Jago Carton P Kenyona Rena, CMA

## 2020-12-23 NOTE — Progress Notes (Signed)
[facility] for Infectious Disease  Patient Active Problem List   Diagnosis Date Noted   Bronchiectasis ([place]) 10/28/2020    Priority: 1.   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   ALBUTEROL (VENTOLIN HFA) 108 (90 BASE) MCG/ACT INHALER    Inhale 2 puffs into the lungs every 4 (four) hours as needed for wheezing or shortness of breath.   AMITRIPTYLINE (ELAVIL) 25 MG TABLET    Take 25 mg by mouth at bedtime.   ASPIRIN EC 81 MG TABLET    Take 81 mg by mouth daily.   BRIMONIDINE (ALPHAGAN) 0.2 % OPHTHALMIC SOLUTION       BUDESONIDE-FORMOTEROL (SYMBICORT) 160-4.5 MCG/ACT INHALER    Inhale 2 puffs into the lungs through spacer 2 (two) times daily then rinse mouth.   CALCIUM CITRATE-VITAMIN D (CALCIUM CITRATE+D3 PETITES) 200-250 MG-UNIT TABS    Take by mouth.   CELECOXIB (CELEBREX) 100 MG CAPSULE    Take 1 capsule (100 mg total) by mouth 2 (two) times daily.   CETIRIZINE (ZYRTEC) 10 MG TABLET       CHLORTHALIDONE (HYGROTON) 25 MG TABLET    Take 25 mg by mouth daily.   DORZOLAMIDE HCL-TIMOLOL MAL PF 2-0.5 % SOLN       FLUTICASONE  (FLONASE) 50 MCG/ACT NASAL SPRAY    1 spray by Each Nare route daily.   IBANDRONATE (BONIVA) 150 MG TABLET    Take 150 mg by mouth every 30 (thirty) days. Take in the morning with a full glass of water, on an empty stomach, and do not take anything else by mouth or lie down for the next 30 min.   LABETALOL (NORMODYNE) 100 MG TABLET       LATANOPROST (XALATAN) 0.005 % OPHTHALMIC SOLUTION    Place 1 drop into both eyes at bedtime.   LOSARTAN (COZAAR) 25 MG TABLET       MULTIPLE VITAMIN (MULTIVITAMIN) TABLET    Take 1 tablet by mouth daily.   NEOMYCIN-POLYMYXIN-HYDROCORTISONE (CORTISPORIN) 3.5-10000-1 OTIC SUSPENSION       POTASSIUM CHLORIDE (KLOR-CON) 20 MEQ PACKET    Take 20 mEq by mouth daily.   REFRESH LIQUIGEL 1 % GEL    See admin instructions.   ROSUVASTATIN (CRESTOR) 20 MG TABLET    Take 20 mg by mouth daily.  Modified Medications   No medications on file  Discontinued Medications   No medications on file    Subjective: Ms. Cleland is in for her routine follow-up visit.  She is accompanied by her daughter, [name].  She says that she feels a little more fatigue every other day.  She  has minimal cough that occurs at night with very little sputum production.  She has not noted any change in her chronic dyspnea on exertion.  She is not having any fever, chills or sweats.  [name] tells me that their goal is to travel to [place] next year.  Ms. Sterba is using her rescue inhaler before she climbs stairs to help with her dyspnea.  She has not getting any other regular exercise.  Review of Systems: Review of Systems  Constitutional:  Negative for chills, diaphoresis, fever and weight loss.  Respiratory:  Positive for cough, sputum production and shortness of breath. Negative for hemoptysis and wheezing.   Cardiovascular:  Negative for chest pain.  Psychiatric/Behavioral:  Negative for depression. The patient is not nervous/anxious.    Past Medical History:  Diagnosis Date   Arthritis     Asthma    Borderline diabetes    Breast mass, left 07/10/2017   06/19/17: mammos: lobulated mass sup & lat to nipple; [place]: 1.4x0.5x1.0 cm  Bi-RADS4   Complication of anesthesia    "I SLEEP A VERY VERY LONG TIME"   Eczema    Environmental allergies    Glaucoma    History of transfusion    HTN (hypertension), benign 07/10/2017   Hyperlipidemia    Hypertension    Tingling    OF FEET    Social History   Tobacco Use   Smoking status: Never   Smokeless tobacco: Never  Substance Use Topics   Alcohol use: No   Drug use: No    Family History  Problem Relation Age of Onset   Breast cancer Paternal 45    Breast cancer Cousin    Breast cancer Cousin    Heart disease Mother    Lung cancer Father     No Known Allergies  Objective: Vitals:   12/23/20 1050  BP: (!) 146/80  Pulse: 73  Temp: 97.8 F (36.6 C)  TempSrc: Oral  SpO2: 99%  Weight: 188 lb (85.3 kg)   Body mass index is 31.28 kg/m.  Physical Exam Constitutional:      Comments: Her spirits are good.  Cardiovascular:     Rate and Rhythm: Normal rate and regular rhythm.     Heart sounds: No murmur heard. Pulmonary:     Effort: Pulmonary effort is normal.     Breath sounds: Rales present. No wheezing or rhonchi.    Lab Results    Problem List Items Addressed This Visit       1.   Mycobacterium abscessus infection    She has no worsening symptoms to suggest that her Mycobacterium abscessus infection is flaring.  I told her that I would not recommend starting a very complicated and potentially dangerous IV antibiotic regimen at this point.  She is in agreement with that plan.  I did tell her that it is very important for her to be more active.  I encouraged her to try to do some walking each day.  She will get the new by valent COVID booster vaccine and an influenza vaccine soon.  She will follow up here in 3 months.        Michel Bickers, MD [facility] [phone] pager   [phone] cell 12/23/2020, 11:15 AM

## 2020-12-23 NOTE — Assessment & Plan Note (Signed)
She has no worsening symptoms to suggest that her Mycobacterium abscessus infection is flaring.  I told her that I would not recommend starting a very complicated and potentially dangerous IV antibiotic regimen at this point.  She is in agreement with that plan.  I did tell her that it is very important for her to be more active.  I encouraged her to try to do some walking each day.  She will get the new by valent COVID booster vaccine and an influenza vaccine soon.  She will follow up here in 3 months.

## 2020-12-24 ENCOUNTER — Ambulatory Visit
Admission: RE | Admit: 2020-12-24 | Discharge: 2020-12-24 | Disposition: A | Discharge status: Home / Self Care | Payer: Medicare PPO | Source: Ambulatory Visit | Attending: Internal Medicine | Admitting: Internal Medicine

## 2020-12-24 DIAGNOSIS — Z1231 Encounter for screening mammogram for malignant neoplasm of breast: Secondary | ICD-10-CM

## 2020-12-25 ENCOUNTER — Other Ambulatory Visit (HOSPITAL_COMMUNITY): Payer: Self-pay

## 2020-12-28 ENCOUNTER — Telehealth: Payer: Self-pay | Admitting: Internal Medicine

## 2020-12-28 MED ORDER — ALBUTEROL SULFATE HFA 108 (90 BASE) MCG/ACT IN AERS: 2.0000 | INHALATION_SPRAY | RESPIRATORY_TRACT | 12 refills | Status: DC | PRN

## 2020-12-28 MED ORDER — BUDESONIDE-FORMOTEROL FUMARATE 160-4.5 MCG/ACT IN AERO: 2.0000 | INHALATION_SPRAY | Freq: Two times a day (BID) | RESPIRATORY_TRACT | 12 refills | Status: DC

## 2020-12-28 NOTE — Telephone Encounter (Signed)
Due to insurance, [name] asks that mother's inhalers be sent to [facility]

## 2020-12-30 ENCOUNTER — Ambulatory Visit: Payer: Medicare PPO | Admitting: Internal Medicine

## 2021-03-07 NOTE — Progress Notes (Deleted)
HPI F nebver smoker, mother of Teresa Mire, NP, followed for Bronchiectasis/ MAIC/ M abscessus),  HTN, DVT, CAD, Aortic Atherosclerosis,  Peripheral Neuropathy, Rheumatoid Arthritis , Osteoporosis, Hyperlipidemia, Glaucoma, L Breast Mass, Environmental Allergies, Memory Loss, Covid Infection Jan 2022,  Sputum cx 09/18/20+ M. Abscessus. HST 11/01/20- AHI11.4/ hr, desaturation to 70%, body weight 188 lbs  ================================================================================    11/04/20- 26 yoF nebver smoker, mother of Teresa Mire, NP, followed for Bronchiectasis/ MAIC/ M abscessus),  HTN, DVT, CAD, Aortic Atherosclerosis,  Peripheral Neuropathy, Rheumatoid Arthritis , Osteoporosis, Hyperlipidemia, Glaucoma, L Breast Mass, Environmental Allergies, Memory Loss, Covid Infection Jan 2022,  OSA (mild 11.4/hr),                                           -Symbicort 160,  Ventolin hfa,  Sputum cx 09/18/20+ M. Abscessus. Has consulted Dr Campbell/ ID with consideration of long term IV therapy. Recultured for confirmation 10/29/20. Dr Megan Salon asked her to f/u here for management of bronchiectasis At last visit there had ben concern of chronic fatigue and possible OSA HST 11/01/20- AHI11.4/ hr, desaturation to 70%, body weight 188 lbs  Body weight today-                                                                           Covid vax-    3 Phizer                                                Dtr Teresa French here -----Chronic Bronchitis, states increased DOE We discussed bronchiectasis, explaining terms and emphasizing pulmonary toilet. She denies reflux/ potential aspiration. No fever, chills or blood. Does occ note tussive bilateral lower lateral rib pains- transient. We are going to start with Flutter device. Potentially try pneumatic Vest later. Sleep study reviewed- mild OSA. She and daughter both emphasize snoring and daytime fatigue. They choose to treat rather than observe, and  favor exploring oral appliance.  CT chest 09/30/20- IMPRESSION: 1. 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 2. A few scattered nodular foci of peripheral peribronchovascular consolidation, largest 1.2 cm in the posterior right lower lobe, probably infectious/inflammatory such as due to MAI. Suggest attention on follow-up noncontrast chest CT in 3-6 months. 3. Scattered mild air trapping in both lungs, indicative of small airways disease. 4. Three-vessel coronary atherosclerosis. 5. Diffuse hepatic steatosis. 6. Aortic Atherosclerosis (ICD10-I70.0).  03/08/21- 57 yoF never smoker, mother of Teresa Mire, NP, followed for Bronchiectasis/ MAIC/ M abscessus),  OSA, HTN, DVT, CAD, Aortic Atherosclerosis,  Peripheral Neuropathy, Rheumatoid Arthritis , Osteoporosis, Hyperlipidemia, Glaucoma, L Breast Mass, Environmental Allergies, Memory Loss, Covid Infection Jan 2022,  OSA (mild 11.4/hr),                                           -  Symbicort 160,  Ventolin hfa, Neb albuterol, Flutter,  Sputum cx 09/18/20+ M. Abscessus. Has consulted Dr Campbell/ ID with consideration of long term IV therapy. Recultured for confirmation 10/29/20. Dr Megan Salon asked her to f/u here for management of bronchiectasis At last visit there had ben concern of chronic fatigue and possible OSA HST 11/01/20- AHI11.4/ hr, desaturation to 70%, body weight 188 lbs>> we referred to Dr Ron Parker for OAP  Body weight today-                                                                           Covid vax-    3 Phizer  Flu vax-  She had ID f/u w Dr Megan Salon in Oct. He recommended against med treatment for M.abscessus now since she is not symptomatic.    ROS-see HPI   + = positive Constitutional:     weight loss, night sweats, fevers, chills, +fatigue, lassitude. HEENT:    headaches, difficulty swallowing, tooth/dental problems, sore throat,       +sneezing, itching, ear ache, +nasal congestion, post nasal drip, snoring CV:    chest pain, orthopnea, PND, +swelling in lower extremities, anasarca,                                   dizziness, palpitations Resp:   +shortness of breath with exertion or at rest.                +productive cough,   non-productive cough, coughing up of blood.              +change in color of mucus.  wheezing.   Skin:    +rash or lesions. GI:  No-   heartburn, indigestion, abdominal pain, nausea, vomiting, diarrhea,                 change in bowel habits, loss of appetite GU: dysuria, change in color of urine, no urgency or frequency.   flank pain. MS:   joint pain, stiffness, decreased range of motion, back pain. Neuro-     nothing unusual Psych:  change in mood or affect.  depression or anxiety.   memory loss.  OBJ- Physical Exam General- Alert, Oriented, Affect-appropriate, Distress- none acute, + obese Skin- rash-none, lesions- none, excoriation- none Lymphadenopathy- none Head- atraumatic            Eyes- Gross vision intact, PERRLA, conjunctivae and secretions clear            Ears- Hearing, canals-normal            Nose- Clear, no-Septal dev, mucus, polyps, erosion, perforation             Throat- Mallampati IV , mucosa clear , drainage- none, tonsils- atrophic, +teeth Neck- flexible , trachea midline, no stridor , thyroid nl, carotid no bruit Chest - symmetrical excursion , unlabored           Heart/CV- RRR , no murmur , no gallop  , no rub, nl s1 s2                           -  JVD- none , edema- none, stasis changes- none, varices- none           Lung- +very c lear today wheeze- none, cough- none , dullness-none, rub- none           Chest wall-  Abd-  Br/ Gen/ Rectal- Not done, not indicated Extrem- cyanosis- none, clubbing, none, atrophy- none,  strength- nl Neuro- grossly intact to observation

## 2021-03-08 ENCOUNTER — Ambulatory Visit: Payer: Medicare PPO | Admitting: Internal Medicine

## 2021-03-09 ENCOUNTER — Telehealth: Payer: Self-pay | Admitting: Internal Medicine

## 2021-03-09 NOTE — Telephone Encounter (Signed)
Missed appt yesterday due to traffic in [place]. Staying now in [place] with daughter Teresa Mire, NP = contact person. Try [name] at [phone] or [phone] Please work her in with me anytime between now and January 6- ok to use held spot

## 2021-03-09 NOTE — Telephone Encounter (Signed)
Attempted to contact voicemail is full. Will try back at later time.

## 2021-03-10 NOTE — Telephone Encounter (Signed)
Attempted to call [name] but line went directly to VM. Unable to leave VM due to mailbox being full. Will try to call back later.

## 2021-03-12 ENCOUNTER — Other Ambulatory Visit: Payer: Self-pay | Admitting: Internal Medicine

## 2021-03-16 NOTE — Telephone Encounter (Signed)
Called [place] but call went straight to VM and her VM is full.   When [place] calls back, please schedule patient with Dr. Annamaria Boots as requested.

## 2021-03-16 NOTE — Telephone Encounter (Signed)
ATC [name] x1, went straight to VM and VM is full. ATC patient x1.  LVM to return call.

## 2021-03-16 NOTE — Telephone Encounter (Signed)
Returning call.

## 2021-03-23 ENCOUNTER — Other Ambulatory Visit: Payer: Self-pay

## 2021-03-23 ENCOUNTER — Ambulatory Visit: Payer: Medicare PPO | Admitting: Internal Medicine

## 2021-03-23 VITALS — BP 127/72 | HR 85 | Temp 97.5°F | Wt 190.0 lb

## 2021-03-23 DIAGNOSIS — A318 Other mycobacterial infections: Secondary | ICD-10-CM

## 2021-03-23 DIAGNOSIS — Z23 Encounter for immunization: Secondary | ICD-10-CM

## 2021-03-23 NOTE — Progress Notes (Signed)
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    Priority: High   COVID-19 09/16/2020   Asthmatic bronchitis 09/08/2020   OSA (obstructive sleep apnea) 09/08/2020   DVT (deep venous thrombosis) ([place]) 02/12/2019   Breast mass, left 07/10/2017   HTN (hypertension), benign 07/10/2017   Peripheral neuropathy 03/08/2016   Borderline diabetes    Rheumatoid arthritis ([place]) 10/29/2015   DJD (degenerative joint disease) 10/29/2015   Osteoporosis 10/29/2015   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   ALBUTEROL (VENTOLIN HFA) 108 (90 BASE) MCG/ACT INHALER    Inhale 2 puffs into the lungs every 4 (four) hours as needed for wheezing or shortness of breath.   AMITRIPTYLINE (ELAVIL) 25 MG TABLET    Take 25 mg by mouth at bedtime.   ASPIRIN EC 81 MG TABLET    Take 81 mg by mouth daily.   BRIMONIDINE (ALPHAGAN) 0.2 % OPHTHALMIC SOLUTION       BUDESONIDE-FORMOTEROL (SYMBICORT) 160-4.5 MCG/ACT INHALER    Inhale 2 puffs into the lungs through spacer 2 (two) times daily then rinse mouth.   CALCIUM CITRATE-VITAMIN D (CALCIUM CITRATE+D3 PETITES) 200-250 MG-UNIT TABS    Take by mouth.   CELECOXIB (CELEBREX) 100 MG CAPSULE    Take 1 capsule (100 mg total) by mouth 2 (two) times daily.   CETIRIZINE (ZYRTEC) 10 MG TABLET       CHLORTHALIDONE (HYGROTON) 25 MG TABLET    Take 25 mg by mouth daily.   DORZOLAMIDE HCL-TIMOLOL MAL PF 2-0.5 % SOLN        FLUTICASONE (FLONASE) 50 MCG/ACT NASAL SPRAY    1 spray by Each Nare route daily.   IBANDRONATE (BONIVA) 150 MG TABLET    Take 150 mg by mouth every 30 (thirty) days. Take in the morning with a full glass of water, on an empty stomach, and do not take anything else by mouth or lie down for the next 30 min.   LABETALOL (NORMODYNE) 100 MG TABLET       LATANOPROST (XALATAN) 0.005 % OPHTHALMIC SOLUTION    Place 1 drop into both eyes at bedtime.   LOSARTAN (COZAAR) 25 MG TABLET       MULTIPLE VITAMIN (MULTIVITAMIN) TABLET    Take 1 tablet by mouth daily.   NEOMYCIN-POLYMYXIN-HYDROCORTISONE (CORTISPORIN) 3.5-10000-1 OTIC SUSPENSION       POTASSIUM CHLORIDE (KLOR-CON) 20 MEQ PACKET    Take 20 mEq by mouth daily.   REFRESH LIQUIGEL 1 % GEL    See admin instructions.   ROSUVASTATIN (CRESTOR) 20 MG TABLET    Take 20 mg by mouth daily.  Modified Medications   No medications on file  Discontinued Medications   No medications on file    Subjective: Teresa French is in for her routine follow-up visit.  She is accompanied by her daughter, [name].  She has minimal cough that occurs at night with very little sputum production.  She has not had any hemoptysis.  This has not changed.  She thinks that her chronic dyspnea on exertion may be slightly worse recently.  She has not noted any wheezing but says that she still uses her rescue inhaler before exertion and notes some benefit.  She is not having any fever, chills or sweats.  [name] tells me that their goal is to travel to [place] next year.  Teresa French is using her rescue inhaler before she climbs stairs to help with her dyspnea.  She has not getting any other regular exercise.  Review of Systems: Review of Systems  Constitutional:  Negative for chills, diaphoresis, fever and weight loss.  Respiratory:  Positive for cough, sputum production and shortness of breath. Negative for hemoptysis and wheezing.   Cardiovascular:  Negative for chest pain.   Psychiatric/Behavioral:  Negative for depression. The patient is not nervous/anxious.    Past Medical History:  Diagnosis Date   Arthritis    Asthma    Borderline diabetes    Breast mass, left 07/10/2017   06/19/17: mammos: lobulated mass sup & lat to nipple; [place]: 1.4x0.5x1.0 cm  Bi-RADS4   Complication of anesthesia    "I SLEEP A VERY VERY LONG TIME"   Eczema    Environmental allergies    Glaucoma    History of transfusion    HTN (hypertension), benign 07/10/2017   Hyperlipidemia    Hypertension    Tingling    OF FEET    Social History   Tobacco Use   Smoking status: Never   Smokeless tobacco: Never  Substance Use Topics   Alcohol use: No   Drug use: No    Family History  Problem Relation Age of Onset   Heart disease Mother    Lung cancer Father    Breast cancer Paternal Aunt        5s   Breast cancer Cousin 55   Breast cancer Cousin 40    No Known Allergies  Objective: Vitals:   03/23/21 1546  BP: 127/72  Pulse: 85  Temp: (!) 97.5 F (36.4 C)  TempSrc: Temporal  SpO2: 99%  Weight: 190 lb (86.2 kg)   Body mass index is 31.62 kg/m.  Physical Exam Constitutional:      Comments: Her weight is up 2 pounds over the last 3 months.  Cardiovascular:     Rate and Rhythm: Normal rate and regular rhythm.     Heart sounds: No murmur heard. Pulmonary:     Effort: Pulmonary effort is normal.     Breath sounds: Rales present. No wheezing or rhonchi.    Lab Results Sputum culture 10/29/2020: Mycobacterium abscessus complex (Mycobacterium avium not isolated)  CT 09/29/2020 IMPRESSION: 1. 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 2. A few scattered nodular foci of peripheral peribronchovascular consolidation, largest 1.2 cm in  the posterior right lower lobe, probably infectious/inflammatory such as due to MAI. Suggest attention on follow-up noncontrast chest CT in 3-6 months. 3. Scattered mild air trapping in both lungs, indicative of small airways disease. 4. Three-vessel coronary atherosclerosis. 5. Diffuse hepatic steatosis. 6. Aortic Atherosclerosis (ICD10-I70.0).   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  That would require placement of a PICC for long-term IV antibiotic therapy with 3 different antibiotics.  Cure of her infection would be extremely unlikely and antibiotic therapy would carry significant risk of serious adverse complications.  They are both comfortable with continuing a cautious wait-and-see approach off of antibiotics for now.  She had her COVID booster vaccine 3 months ago.  She received a high-dose influenza vaccine today.  She will follow-up here in 3 months.       Michel Bickers, MD [facility] [place] [facility] [phone] pager   [phone] cell 03/23/2021, 4:12 PM

## 2021-03-23 NOTE — Assessment & Plan Note (Signed)
I had another discussion with them about the relative risks and benefits of treating her Mycobacterium abscessus pneumonia.  That would require placement of a PICC for long-term IV antibiotic therapy with 3 different antibiotics.  Cure of her infection would be extremely unlikely and antibiotic therapy would carry significant risk of serious adverse complications.  They are both comfortable with continuing a cautious wait-and-see approach off of antibiotics for now.  She had her COVID booster vaccine 3 months ago.  She received a high-dose influenza vaccine today.  She will follow-up here in 3 months.

## 2021-04-29 ENCOUNTER — Telehealth: Payer: Self-pay | Admitting: Internal Medicine

## 2021-04-29 ENCOUNTER — Other Ambulatory Visit (HOSPITAL_COMMUNITY): Payer: Self-pay

## 2021-04-29 NOTE — Telephone Encounter (Signed)
ATC [name] to let her know of the message from pharmacy team, unable to leave message due to mailbox being full.

## 2021-04-29 NOTE — Telephone Encounter (Signed)
Called pharmacy and verified if PA was needed. PA not needed. Medication is ready for pick up at the pharmacy for $64.

## 2021-05-05 NOTE — Telephone Encounter (Signed)
ATC Teresa French x2 to let her know of the message from pharmacy team, unable to leave message due to mailbox being full.

## 2021-06-08 ENCOUNTER — Ambulatory Visit: Payer: Medicare PPO | Admitting: Internal Medicine

## 2021-06-09 NOTE — Progress Notes (Signed)
HPI ?F nebver smoker, mother of [name], NP, followed for Bronchiectasis/ MAIC/ M abscessus),  HTN, DVT, CAD, Aortic Atherosclerosis,  Peripheral Neuropathy, Rheumatoid Arthritis , Osteoporosis, Hyperlipidemia, Glaucoma, L Breast Mass, Environmental Allergies, Memory Loss, Covid Infection Jan 2022,  ?Sputum cx 09/18/20+ M. Abscessus. ?HST 11/01/20- AHI11.4/ hr, desaturation to 70%, body weight 188 lbs ? ?================================================================================ ? ? ? ?11/04/20- 79 yoF nebver smoker, mother of [name], NP, followed for Bronchiectasis/ MAIC/ M abscessus),  HTN, DVT, CAD, Aortic Atherosclerosis,  Peripheral Neuropathy, Rheumatoid Arthritis , Osteoporosis, Hyperlipidemia, Glaucoma, L Breast Mass, Environmental Allergies, Memory Loss, Covid Infection Jan 2022,  ?OSA (mild 11.4/hr),                                           ?-Symbicort 160,  Ventolin hfa,  ?Sputum cx 09/18/20+ M. Abscessus. Has consulted Dr Campbell/ ID with consideration of long term IV therapy. Recultured for confirmation 10/29/20. ?Dr Megan Salon asked her to f/u here for management of bronchiectasis ?At last visit there had ben concern of chronic fatigue and possible OSA ?HST 11/01/20- AHI11.4/ hr, desaturation to 70%, body weight 188 lbs ? Body weight today-                                                                         ?  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 She denies reflux/ potential aspiration. No fever, chills or blood. Does occ note tussive bilateral lower lateral rib pains- transient. ?We are going to start with Flutter device. Potentially try pneumatic Vest later. ?Sleep study reviewed- mild OSA. She and daughter both emphasize snoring and daytime fatigue. They choose to treat rather than observe, and  favor exploring oral appliance.  ?CT chest 09/30/20- ?IMPRESSION: ?1. Widespread mild to moderate cylindrical bronchiectasis, bronchial ?wall thickening and patchy tree-in-bud opacities, most prominent in ?the mid to lower lungs, compatible with chronic infectious or ?inflammatory bronchiolitis such as due to atypical mycobacterial ?infection (MAI). ?2. A few scattered nodular foci of peripheral peribronchovascular ?consolidation, largest 1.2 cm in the posterior right lower lobe, ?probably infectious/inflammatory such as due to MAI. Suggest ?attention on follow-up noncontrast chest CT in 3-6 months. ?3. Scattered mild air trapping in both lungs, indicative of small ?airways disease. ?4. Three-vessel coronary atherosclerosis. ?5. Diffuse hepatic steatosis. ?6. Aortic Atherosclerosis (ICD10-I70.0). ? ?06/10/21- 80 yoF never smoker, mother of [name], NP, followed for Bronchiectasis/ MAIC/ M abscessus), OSA/ OAP, complicated by  HTN, DVT, CAD, Aortic Atherosclerosis,  Peripheral Neuropathy, Rheumatoid Arthritis , Osteoporosis, Hyperlipidemia, Glaucoma, L Breast Mass, Environmental Allergies, Memory Loss, Covid Infection Jan 2022,                                           ?-  Symbicort 160,  Ventolin hfa,  Neb albuterol, Flonase       note Timoptic ?They have chosen to continue observation of M.abscessus after discussing Rx w Dr Megan Salon. ?Here with her daughter.  Family is aware there appears to be some dementia. ?Using rescue inhaler 2 or 3 times a day.  Feels Symbicort is expensive.  No significant exacerbation.  We will try switch to [facility] ?She never followed through previously on referral for oral appliance but agrees to meet Dr. Ron Parker now. ? ?ROS-see HPI   + = positive ?Constitutional:    weight loss, night sweats, fevers, chills, +fatigue, lassitude. ?HEENT:    headaches, difficulty swallowing, tooth/dental problems, sore throat,  ?     +sneezing, itching, ear ache, +nasal congestion, post nasal drip,  snoring ?CV:    chest pain, orthopnea, PND, +swelling in lower extremities, anasarca,                                   ?dizziness, palpitations ?Resp:   +shortness of breath with exertion or at rest.   ?             +productive cough,   non-productive cough, coughing up of blood.   ?           +change in color of mucus.  wheezing.   ?Skin:    +rash or lesions. ?GI:  No-   heartburn, indigestion, abdominal pain, nausea, vomiting, diarrhea,  ?               change in bowel habits, loss of appetite ?GU: dysuria, change in color of urine, no urgency or frequency.   flank pain. ?MS:   joint pain, stiffness, decreased range of motion, back pain. ?Neuro-     nothing unusual ?Psych:  change in mood or affect.  depression or anxiety.   memory loss. ? ?OBJ- Physical Exam ?General- Alert, Oriented, Affect-appropriate, Distress- none acute, + obese ?Skin- rash-none, lesions- none, excoriation- none ?Lymphadenopathy- none ?Head- atraumatic ?           Eyes- Gross vision intact, PERRLA, conjunctivae and secretions clear ?           Ears- Hearing, canals-normal ?           Nose- Clear, no-Septal dev, mucus, polyps, erosion, perforation  ?           Throat- Mallampati IV , mucosa clear , drainage- none, tonsils- atrophic, +teeth ?Neck- flexible , trachea midline, no stridor , thyroid nl, carotid no bruit ?Chest - symmetrical excursion , unlabored ?          Heart/CV- RRR , no murmur , no gallop  , no rub, nl s1 s2 ?                          - JVD- none , edema- none, stasis changes- none, varices- none ?          Lung- +very c lear today wheeze- none, cough- none , dullness-none, rub- none ?          Chest wall-  ?Abd-  ?Br/ Gen/ Rectal- Not done, not indicated ?Extrem- cyanosis- none, clubbing, none, atrophy- none, strength- nl ?Neuro- grossly intact to observation ? ? ? ?

## 2021-06-10 ENCOUNTER — Ambulatory Visit: Payer: Medicare PPO | Admitting: Internal Medicine

## 2021-06-10 ENCOUNTER — Ambulatory Visit (INDEPENDENT_AMBULATORY_CARE_PROVIDER_SITE_OTHER): Payer: Medicare PPO

## 2021-06-10 ENCOUNTER — Encounter: Payer: Self-pay | Admitting: Internal Medicine

## 2021-06-10 ENCOUNTER — Other Ambulatory Visit: Payer: Self-pay

## 2021-06-10 VITALS — BP 130/80 | HR 85 | Temp 98.1°F | Ht 65.0 in | Wt 189.6 lb

## 2021-06-10 DIAGNOSIS — G4733 Obstructive sleep apnea (adult) (pediatric): Secondary | ICD-10-CM | POA: Diagnosis not present

## 2021-06-10 DIAGNOSIS — A318 Other mycobacterial infections: Secondary | ICD-10-CM | POA: Diagnosis not present

## 2021-06-10 DIAGNOSIS — J479 Bronchiectasis, uncomplicated: Secondary | ICD-10-CM

## 2021-06-10 DIAGNOSIS — J4541 Moderate persistent asthma with (acute) exacerbation: Secondary | ICD-10-CM | POA: Diagnosis not present

## 2021-06-10 MED ORDER — FLUTICASONE FUROATE-VILANTEROL 100-25 MCG/ACT IN AEPB: 1.0000 | INHALATION_SPRAY | Freq: Every day | RESPIRATORY_TRACT | 0 refills | Status: DC

## 2021-06-10 NOTE — Patient Instructions (Signed)
Order- sample x 2 Breo 100 inhaler     Inhale 1 puff then rinse mouth, once daily ?Try this instead of Symbicort- see if it would be cheaper. ? ?Order- referral to Dr Ron Parker, Orthodontist, for consideration of oral appliance for OSA ? ?Order- CXR    dx Bronchiectasis, atypical AFB infection ?

## 2021-06-15 ENCOUNTER — Other Ambulatory Visit: Payer: Self-pay

## 2021-06-15 ENCOUNTER — Encounter: Payer: Self-pay | Admitting: Internal Medicine

## 2021-06-15 ENCOUNTER — Ambulatory Visit: Payer: Medicare PPO | Admitting: Internal Medicine

## 2021-06-15 DIAGNOSIS — A318 Other mycobacterial infections: Secondary | ICD-10-CM

## 2021-06-15 DIAGNOSIS — J479 Bronchiectasis, uncomplicated: Secondary | ICD-10-CM | POA: Diagnosis not present

## 2021-06-15 NOTE — Assessment & Plan Note (Signed)
I do not see any recent changes that would warrant starting a very complicated and potentially risky multidrug IV antibiotic regimen against Mycobacterium abscessus.  She is in agreement with continued watchful waiting. ?

## 2021-06-15 NOTE — Progress Notes (Signed)
?  ? ? ? ? ?[facility] ? ?Patient Active Problem List  ? Diagnosis Date Noted  ? Bronchiectasis ([place]) 10/28/2020  ?  Priority: High  ? 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  Priority: High  ? 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  Priority: High  ? COVID-19 09/16/2020  ? Asthmatic bronchitis 09/08/2020  ? OSA (obstructive sleep apnea) 09/08/2020  ? DVT (deep venous thrombosis) ([place]) 02/12/2019  ? Breast mass, left 07/10/2017  ? HTN (hypertension), benign 07/10/2017  ? Peripheral neuropathy 03/08/2016  ? Borderline diabetes   ? Rheumatoid arthritis ([place]) 10/29/2015  ? DJD (degenerative joint disease) 10/29/2015  ? Osteoporosis 10/29/2015  ? Osteoarthritis of right hip 11/21/2014  ? 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 ? ?Patient's Medications  ?New Prescriptions  ? No medications on file  ?Previous Medications  ? ALBUTEROL (PROVENTIL) (2.5 MG/3ML) 0.083% NEBULIZER SOLUTION    Take 3 mLs (2.5 mg total) by nebulization 2 (two) times daily as needed for wheezing or shortness of breath.  ? ALBUTEROL (VENTOLIN HFA) 108 (90 BASE) MCG/ACT INHALER    Inhale 2 puffs into the lungs every 4 (four) hours as needed for wheezing or shortness of breath.  ? AMITRIPTYLINE (ELAVIL) 25 MG TABLET    Take 25 mg by mouth at bedtime.  ? ASPIRIN EC 81 MG TABLET    Take 81 mg by mouth daily.  ? BRIMONIDINE (ALPHAGAN) 0.2 % OPHTHALMIC SOLUTION      ? CALCIUM CITRATE-VITAMIN D (CALCIUM CITRATE+D3 PETITES) 200-250 MG-UNIT TABS    Take by mouth.  ? CELECOXIB (CELEBREX) 100 MG CAPSULE    Take 1 capsule (100 mg total) by mouth 2 (two) times daily.  ? CETIRIZINE (ZYRTEC) 10 MG TABLET      ? CHLORTHALIDONE (HYGROTON) 25 MG TABLET    Take 25 mg by mouth daily.  ? DORZOLAMIDE HCL-TIMOLOL MAL PF 2-0.5 % SOLN      ? FLUTICASONE (FLONASE) 50 MCG/ACT NASAL SPRAY    1 spray by Each Nare route daily.  ? FLUTICASONE FUROATE-VILANTEROL (BREO ELLIPTA) 100-25 MCG/ACT AEPB     Inhale 1 puff into the lungs daily.  ? IBANDRONATE (BONIVA) 150 MG TABLET    Take 150 mg by mouth every 30 (thirty) days. Take in the morning with a full glass of water, on an empty stomach, and do not take anything else by mouth or lie down for the next 30 min.  ? LABETALOL (NORMODYNE) 100 MG TABLET      ? LATANOPROST (XALATAN) 0.005 % OPHTHALMIC SOLUTION    Place 1 drop into both eyes at bedtime.  ? LOSARTAN (COZAAR) 25 MG TABLET      ? MULTIPLE VITAMIN (MULTIVITAMIN) TABLET    Take 1 tablet by mouth daily.  ? NEOMYCIN-POLYMYXIN-HYDROCORTISONE (CORTISPORIN) 3.5-10000-1 OTIC SUSPENSION      ? POTASSIUM CHLORIDE (KLOR-CON) 20 MEQ PACKET    Take 20 mEq by mouth daily.  ? REFRESH LIQUIGEL 1 % GEL    See admin instructions.  ? ROSUVASTATIN (CRESTOR) 20 MG TABLET    Take 20 mg by mouth daily.  ?Modified Medications  ? No medications on file  ?Discontinued Medications  ? BUDESONIDE-FORMOTEROL (SYMBICORT) 160-4.5 MCG/ACT INHALER    Inhale 2 puffs into the lungs through spacer 2 (two) times daily then rinse mouth.  ? ? ?Subjective: ?Teresa French is in for her routine follow-up visit.  She is accompanied by her daughter, Teresa Mire, NP.  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  She had some worsening pulmonary symptoms last summer and a chest CT scan showed evidence of bronchiectasis and new infiltrates.  A sputum culture obtained in July grew Mycobacterium abscessus.  A repeat sputum culture in August grew Mycobacterium abscessus again.  She saw her pulmonologist, Dr. Keturah Barre.  He prescribed a flutter valve and nebulized albuterol.   ? ?As expected, her mycobacterial isolate was multidrug-resistant.  When I saw her in September I did not feel that her symptoms were progressive or severe enough to warrant a very complicated the multidrug IV antibiotic regimen.  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 ? ?She has not noted any change in her  chronic cough.  It is usually nonproductive.  She has felt like she was more short of breath over the past month but she tells me that her insurance company would not pay for Symbicort and she has been out of it for 1 month.  She saw Dr. Annamaria Boots last week who gave her samples of Breo.  However, she tells me today that she has not started using it.  Her appetite is good.  A chest x-ray done 2 days ago did not show any acute changes. ? ?Review of Systems: ?Review of Systems  ?Constitutional:  Negative for fever and weight loss.  ?Respiratory:  Positive for cough, sputum production and shortness of breath. Negative for hemoptysis and wheezing.   ?Cardiovascular:  Negative for chest pain.  ? ?Past Medical History:  ?Diagnosis Date  ? Arthritis   ? Asthma   ? Borderline diabetes   ? Breast mass, left 07/10/2017  ? 06/19/17: mammos: lobulated mass sup & lat to nipple; [place]: 1.4x0.5x1.0 cm  Bi-RADS4  ? Complication of anesthesia   ? "I SLEEP A VERY VERY LONG TIME"  ? Eczema   ? Environmental allergies   ? Glaucoma   ? History of transfusion   ? HTN (hypertension), benign 07/10/2017  ? Hyperlipidemia   ? Hypertension   ? Tingling   ? OF FEET  ? ? ?Social History  ? ?Tobacco Use  ? Smoking status: Never  ? Smokeless tobacco: Never  ?Vaping Use  ? Vaping Use: Never used  ?Substance Use Topics  ? Alcohol use: No  ? Drug use: No  ? ? ?Family History  ?Problem Relation Age of Onset  ? Heart disease Mother   ? Lung cancer Father   ? Breast cancer Paternal Aunt   ?     39s  ? Breast cancer Cousin 18  ? Breast cancer Cousin 28  ? ? ?No Known Allergies ? ?Objective: ?Vitals:  ? 06/15/21 1547  ?BP: (!) 175/79  ?Pulse: 89  ?Temp: 98.8 ?F (37.1 ?C)  ?TempSrc: Temporal  ?SpO2: 99%  ?Weight: 190 lb 12.8 oz (86.5 kg)  ? ?Body mass index is 31.75 kg/m?. ? ?Physical Exam ?Constitutional:   ?   Comments: She is calm and pleasant as usual.  She is accompanied by her daughter.  She is confused about how to take her medication.  ?Cardiovascular:  ?    Rate and Rhythm: Normal rate and regular rhythm.  ?   Heart sounds: No murmur heard. ?Pulmonary:  ?   Effort: Pulmonary effort is normal.  ?   Breath sounds: No wheezing, rhonchi or rales.  ?Psychiatric:     ?   Mood and Affect: Mood normal.  ? ? ?  Lab Results ? ? ?  ?Problem List Items Addressed This Visit   ? ?  ? High  ? Mycobacterium abscessus infection  ?  I do not see any recent changes that would warrant starting a very complicated and potentially risky multidrug IV antibiotic regimen against Mycobacterium abscessus.  She is in agreement with continued watchful waiting. ?  ?  ? Bronchiectasis ([place])  ?  I instructed her to start using the Breo samples now. ?  ?  ? ? ?Michel Bickers, MD ?[facility] for [facility] ?[place] ?[phone] pager   [phone] cell ?06/15/2021, 4:36 PM ? ?

## 2021-06-15 NOTE — Assessment & Plan Note (Signed)
I instructed her to start using the Breo samples now. ?

## 2021-06-17 ENCOUNTER — Telehealth: Payer: Self-pay | Admitting: *Deleted

## 2021-06-17 NOTE — Telephone Encounter (Signed)
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 Daughter would have to be out of work to help her. Teresa French prefers to keep the July appt. ?

## 2021-06-17 NOTE — Telephone Encounter (Signed)
Patient's daughter contacted office re: patient's symptoms worsening. She is scheduled for May FU. Per Dr Leta Baptist my chart virtual visit needs to be scheduled. Will have phone staff call daughter, [name] on [place] and schedule for VV in April. Daughter will need link to set up my chart.  ?

## 2021-06-17 NOTE — Telephone Encounter (Signed)
[name] was called, vm left asking daughter to call office to schedule needed my chart appointment for pt ?

## 2021-06-22 NOTE — Progress Notes (Signed)
Called and left detailed msg on machine ok per DPR.

## 2021-07-09 ENCOUNTER — Encounter: Payer: Self-pay | Admitting: Internal Medicine

## 2021-07-09 NOTE — Assessment & Plan Note (Signed)
She does want to discuss possibility of oral appliance with Dr. Megan Salon.  She has not been happy with CPAP as an option. ?

## 2021-07-09 NOTE — Assessment & Plan Note (Signed)
Not a lot of problem with retained secretions. ?Plan-continue bronchodilators, update CXR.  Emphasize pulmonary toilet. ?

## 2021-07-09 NOTE — Assessment & Plan Note (Signed)
Because of cost we will try changing from Symbicort to [facility]. ?

## 2021-07-09 NOTE — Assessment & Plan Note (Signed)
She accepts Dr. Chilton Greathouse recommendation for observation at this time.  I think that is absolutely the right choice. ?

## 2021-07-15 ENCOUNTER — Telehealth: Payer: Self-pay | Admitting: Internal Medicine

## 2021-07-15 DIAGNOSIS — G4733 Obstructive sleep apnea (adult) (pediatric): Secondary | ICD-10-CM

## 2021-07-15 NOTE — Telephone Encounter (Signed)
Daughter [name] notified me that they saw Dr Ron Parker and he recommended CPAP over oral appliance for OSA. ? ?Order- new DME, new CPAP auto 5-15, mask of choice, humidifier, supplies, AirView/ card ? ?She will need office f/u with me in 31-90 days per insurance regs.   Ok to use held spot if needed.  ?

## 2021-07-16 NOTE — Telephone Encounter (Signed)
Order has been placed for CPAP machine.  ?

## 2021-07-22 ENCOUNTER — Telehealth: Payer: Self-pay | Admitting: Internal Medicine

## 2021-07-22 NOTE — Telephone Encounter (Signed)
Attempted to call pt's daughter [name] but unable to reach. Left message for her to return call. 

## 2021-07-23 NOTE — Telephone Encounter (Signed)
Left message for Teresa French to call back.

## 2021-09-14 ENCOUNTER — Ambulatory Visit: Payer: Medicare PPO | Admitting: Internal Medicine

## 2021-09-14 ENCOUNTER — Other Ambulatory Visit: Payer: Self-pay

## 2021-09-14 ENCOUNTER — Encounter: Payer: Self-pay | Admitting: Internal Medicine

## 2021-09-14 DIAGNOSIS — A318 Other mycobacterial infections: Secondary | ICD-10-CM

## 2021-09-15 ENCOUNTER — Telehealth: Payer: Self-pay | Admitting: Internal Medicine

## 2021-09-15 MED ORDER — FLUTICASONE FUROATE-VILANTEROL 200-25 MCG/ACT IN AEPB
INHALATION_SPRAY | RESPIRATORY_TRACT | 12 refills | Status: DC
Start: 2021-09-15 — End: 2021-12-13

## 2021-09-15 NOTE — Telephone Encounter (Signed)
Called and spoke with pt's daughter Teresa French to see if pt ever received a cpap machine and she said she did not. Stated to her that she needed to contact [facility] for help with this as that is where the order was sent. Teresa French requested me to call her back and leave a message with [facility]'s phone number as she did not have anything to write the number down. Called Teresa French back and left a detailed message of the phone number she needed to call. Nothing further needed.

## 2021-09-15 NOTE — Telephone Encounter (Signed)
Daughter asks change [place] to [facility], [facility], [employer] since pt currently living with her. -done.

## 2021-10-05 ENCOUNTER — Encounter: Payer: Self-pay | Admitting: Diagnostic Neuroimaging

## 2021-10-05 ENCOUNTER — Ambulatory Visit: Payer: Medicare PPO | Admitting: Diagnostic Neuroimaging

## 2021-10-05 VITALS — BP 160/79 | HR 88 | Ht 65.0 in | Wt 182.2 lb

## 2021-10-05 DIAGNOSIS — R413 Other amnesia: Secondary | ICD-10-CM | POA: Diagnosis not present

## 2021-10-05 NOTE — Progress Notes (Signed)
[facility]  PATIENT: Teresa French DOB: 04/18/1940  REFERRING CLINICIAN: Elwyn Reach, MD  HISTORY FROM: patient and daughter [name], NP) REASON FOR VISIT: follow up    [place]:  Chief Complaint  Patient presents with   Memory Loss    Rm 7 dgtr- [name]    HISTORY OF PRESENT ILLNESS:   UPDATE (10/05/21, VRP): Since last visit, memory loss is worsening per daughter. Symptoms are progressive. No def changes in ADLs but daughter concerned about driving safety. Still able to take care of home and bills.   UPDATE (09/07/20, VRP): Since last visit, doing about the same. Some mild memory issues continue.  Symptoms are progressive. Severity is mild-moderate. No alleviating or aggravating factors. Tolerating meds.    PRIOR HPI (02/13/19): 81 year old female here for evaluation of memory loss.  Patient reports some mild short-term memory loss for past few months.  This is mainly noticed by family.  Patient was living in [place] independently and then developed DVT.  Patient came to live with daughter for past 6 weeks so that she could better take care of her.  Patient's daughter is a Designer, jewellery in [place].  Patient still able to maintain her ADLs, bathing, feeding, hygiene, dressing as well as household chores, shopping and driving.  Daughter is more concerned about her ability to live alone, but mainly related to possible future decline and living 2 hours away.   REVIEW OF SYSTEMS: Full 14 system review of systems performed and negative with exception of: As per HPI.  ALLERGIES: No Known Allergies  HOME MEDICATIONS: Outpatient Medications Prior to Visit  Medication Sig Dispense Refill   albuterol (VENTOLIN HFA) 108 (90 Base) MCG/ACT inhaler Inhale 2 puffs into the lungs every 4 (four) hours as needed for wheezing or shortness of breath. 18 g 12   aspirin EC 81 MG tablet Take 81 mg by mouth daily.     brimonidine  (ALPHAGAN) 0.2 % ophthalmic solution      Calcium Citrate-Vitamin D (CALCIUM CITRATE+D3 PETITES) 200-250 MG-UNIT TABS Take by mouth.     celecoxib (CELEBREX) 100 MG capsule Take 1 capsule (100 mg total) by mouth 2 (two) times daily. 60 capsule 0   cetirizine (ZYRTEC) 10 MG tablet      chlorthalidone (HYGROTON) 25 MG tablet Take 25 mg by mouth daily.     Dorzolamide HCl-Timolol Mal PF 2-0.5 % SOLN      fluticasone furoate-vilanterol (BREO ELLIPTA) 200-25 MCG/ACT AEPB Inhale 1 puff then rinse mouth, daily 60 each 12   labetalol (NORMODYNE) 100 MG tablet      latanoprost (XALATAN) 0.005 % ophthalmic solution Place 1 drop into both eyes at bedtime.     losartan (COZAAR) 25 MG tablet      Multiple Vitamin (MULTIVITAMIN) tablet Take 1 tablet by mouth daily.     neomycin-polymyxin-hydrocortisone (CORTISPORIN) 3.5-10000-1 OTIC suspension      potassium chloride (KLOR-CON) 20 MEQ packet Take 20 mEq by mouth daily.     REFRESH LIQUIGEL 1 % GEL See admin instructions.     RHOPRESSA 0.02 % SOLN SMARTSIG:1 Drop(s) In Eye(s) Every Evening     rosuvastatin (CRESTOR) 20 MG tablet Take 20 mg by mouth daily.     albuterol (PROVENTIL) (2.5 MG/3ML) 0.083% nebulizer solution Take 3 mLs (2.5 mg total) by nebulization 2 (two) times daily as needed for wheezing or shortness of breath. 150 mL 12   amitriptyline (ELAVIL) 25 MG tablet Take 25 mg by mouth  at bedtime. (Patient not taking: Reported on 09/14/2021)  3   fluticasone (FLONASE) 50 MCG/ACT nasal spray 1 spray by Each Nare route daily.     ibandronate (BONIVA) 150 MG tablet Take 150 mg by mouth every 30 (thirty) days. Take in the morning with a full glass of water, on an empty stomach, and do not take anything else by mouth or lie down for the next 30 min. (Patient not taking: Reported on 09/14/2021)     SYMBICORT 160-4.5 MCG/ACT inhaler Inhale into the lungs. (Patient not taking: Reported on 10/05/2021)     No facility-administered medications prior to visit.     PAST MEDICAL HISTORY: Past Medical History:  Diagnosis Date   Arthritis    Asthma    Borderline diabetes    Breast mass, left 07/10/2017   06/19/17: mammos: lobulated mass sup & lat to nipple; [place]: 1.4x0.5x1.0 cm  Bi-RADS4   Complication of anesthesia    "I SLEEP A VERY VERY LONG TIME"   Eczema    Environmental allergies    Glaucoma    History of transfusion    HTN (hypertension), benign 07/10/2017   Hyperlipidemia    Hypertension    Memory loss    Tingling    OF FEET    PAST SURGICAL HISTORY: Past Surgical History:  Procedure Laterality Date   BREAST BIOPSY Bilateral    3-5 EACH BREAST   BREAST CYST ASPIRATION     CERVICAL LAMINECTOMY     DILATION AND CURETTAGE OF UTERUS     GANGLION CYST EXCISION     X3   JOINT REPLACEMENT  2007   left hip   TOTAL HIP ARTHROPLASTY Right 11/21/2014   Procedure: RIGHT TOTAL HIP ARTHROPLASTY ANTERIOR APPROACH;  Surgeon: Mcarthur Rossetti, MD;  Location: [facility];  Service: Orthopedics;  Laterality: Right;    FAMILY HISTORY: Family History  Problem Relation Age of Onset   Heart disease Mother    Lung cancer Father    Breast cancer Paternal Aunt        5s   Breast cancer Cousin 39   Breast cancer Cousin 71    SOCIAL HISTORY: Social History   Socioeconomic History   Marital status: Divorced    Spouse name: Not on file   Number of children: 1   Years of education: Not on file   Highest education level: Associate degree: academic program  Occupational History   Not on file  Tobacco Use   Smoking status: Never   Smokeless tobacco: Never  Vaping Use   Vaping Use: Never used  Substance and Sexual Activity   Alcohol use: No   Drug use: No   Sexual activity: Not on file  Other Topics Concern   Not on file  Social History Narrative   09/07/20  lives alone, 10/05/21   Caffeine- coffee 1 cup   Social Determinants of Health   Financial Resource Strain: Not on file  Food Insecurity: Not on file  Transportation Needs:  Not on file  Physical Activity: Not on file  Stress: Not on file  Social Connections: Not on file  Intimate Partner Violence: Not on file     PHYSICAL EXAM  GENERAL EXAM/CONSTITUTIONAL: Vitals:  Vitals:   10/05/21 1559  BP: (!) 160/79  Pulse: 88  Weight: 182 lb 3.2 oz (82.6 kg)  Height: '5\' 5"'$  (1.651 m)   Body mass index is 30.32 kg/m. Wt Readings from Last 3 Encounters:  10/05/21 182 lb 3.2 oz (82.6 kg)  09/14/21 183 lb 6.4 oz (83.2 kg)  06/15/21 190 lb 12.8 oz (86.5 kg)   Patient is in no distress; well developed, nourished and groomed; neck is supple  CARDIOVASCULAR: Examination of carotid arteries is normal; no carotid bruits Regular rate and rhythm, no murmurs Examination of peripheral vascular system by observation and palpation is normal  EYES: Ophthalmoscopic exam of optic discs and posterior segments is normal; no papilledema or hemorrhages No results found.  MUSCULOSKELETAL: Gait, strength, tone, movements noted in Neurologic exam below  NEUROLOGIC: MENTAL STATUS:     09/07/2020    4:35 PM 02/13/2019   11:41 AM  MMSE - Mini Mental State Exam  Orientation to time 5 5  Orientation to Place 4 4  Registration 3 3  Attention/ Calculation 0 2  Recall 1 2  Language- name 2 objects 2 2  Language- repeat 0 0  Language- follow 3 step command 3 3  Language- read & follow direction 1 1  Write a sentence 1 1  Copy design 1 1  Total score 21 24   awake, alert, oriented to person, place and time recent and remote memory intact normal attention and concentration language fluent, comprehension intact, naming intact fund of knowledge appropriate  CRANIAL NERVE:  2nd - no papilledema on fundoscopic exam 2nd, 3rd, 4th, 6th - pupils equal and reactive to light, visual fields full to confrontation, extraocular muscles intact, no nystagmus 5th - facial sensation symmetric 7th - facial strength symmetric 8th - hearing intact 9th - palate elevates symmetrically,  uvula midline 11th - shoulder shrug symmetric 12th - tongue protrusion midline  MOTOR:  normal bulk and tone, full strength in the BUE, BLE  SENSORY:  normal and symmetric to light touch, temperature, vibration  COORDINATION:  finger-nose-finger, fine finger movements normal  REFLEXES:  deep tendon reflexes TRACE and symmetric  GAIT/STATION:  narrow based gait     DIAGNOSTIC DATA (LABS, IMAGING, TESTING) - I reviewed patient records, labs, notes, testing and imaging myself where available.  Lab Results  Component Value Date   WBC 5.1 01/23/2018   HGB 12.3 01/23/2018   HCT 37.0 01/23/2018   MCV 85.6 01/23/2018   PLT 229 01/23/2018      Component Value Date/Time   NA 140 01/23/2018 1507   NA 140 07/10/2017 1457   K 3.7 01/23/2018 1507   CL 103 01/23/2018 1507   CO2 29 01/23/2018 1507   GLUCOSE 99 01/23/2018 1507   BUN 12 01/23/2018 1507   BUN 12 07/10/2017 1457   CREATININE 0.78 01/23/2018 1507   CALCIUM 9.5 01/23/2018 1507   PROT 7.2 01/23/2018 1507   PROT 7.5 07/10/2017 1457   ALBUMIN 4.5 07/10/2017 1457   AST 21 01/23/2018 1507   ALT 12 01/23/2018 1507   ALKPHOS 75 07/10/2017 1457   BILITOT 0.4 01/23/2018 1507   BILITOT 0.3 07/10/2017 1457   GFRNONAA 69 07/10/2017 1457   GFRAA 80 07/10/2017 1457   No results found for: "CHOL", "HDL", "LDLCALC", "LDLDIRECT", "TRIG", "CHOLHDL" Lab Results  Component Value Date   HGBA1C 6.2 (H) 01/23/2018   No results found for: "VITAMINB12" No results found for: "TSH"   01/09/19 CT head 1.  No acute intracranial abnormality. 2.  Bilateral mastoid effusions. 3.  Microvascular ischemic changes.  03/04/19 MRI brain (with and without) demonstrating: - Mild periventricular and subcortical foci of non-specific gliosis. - Bilateral mastoid air cell effusions. - No acute findings.    ASSESSMENT AND PLAN  81 y.o. 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  Reviewed safety and supervision issues.  Dx:  1. Memory loss      PLAN:  MILD MEMORY LOSS (MMSE 21/30; no major changes in ADLs) - safety / supervision issues reviewed - caregiver resources provided - caution with living alone, driving and finances  Return for return to PCP, pending if symptoms worsen or fail to improve.    Penni Bombard, MD 09/01/4313, 4:00 PM Certified in Neurology, Neurophysiology and Neuroimaging  [facility] [address] [place], [place] [address] [phone]

## 2021-11-10 ENCOUNTER — Other Ambulatory Visit (HOSPITAL_COMMUNITY): Payer: Self-pay

## 2021-12-09 ENCOUNTER — Other Ambulatory Visit (HOSPITAL_COMMUNITY): Payer: Self-pay

## 2021-12-13 ENCOUNTER — Other Ambulatory Visit: Payer: Self-pay | Admitting: *Deleted

## 2021-12-13 MED ORDER — FLUTICASONE FUROATE-VILANTEROL 200-25 MCG/ACT IN AEPB: INHALATION_SPRAY | RESPIRATORY_TRACT | 12 refills | Status: DC

## 2021-12-21 IMAGING — CT CT CHEST HIGH RESOLUTION W/O CM
3 of 6 series · 16 of 34 positions shown, 18 images · non-contrast
Comparison: 09/08/2020 chest radiograph.

CLINICAL DATA: Dyspnea and intermittent cough. Asthma. Nonsmoker.
Abnormal chest radiograph.

EXAM:
CT CHEST WITHOUT CONTRAST
TECHNIQUE: Multidetector CT imaging of the chest was performed following the
standard protocol without intravenous contrast. High resolution
imaging of the lungs, as well as inspiratory and expiratory imaging,
was performed.

[Series 2: chest · axial · 0.73mm/px · z∈[-258,-58]mm · 6 of 150 slices shown, 8 images]
[im 25/150  mediastinal]
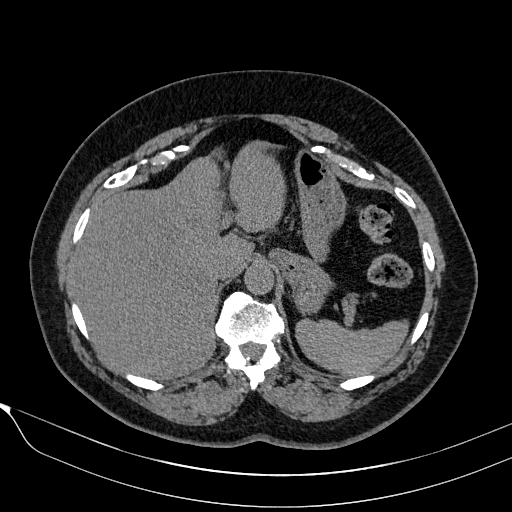
[im 25/150  lung]
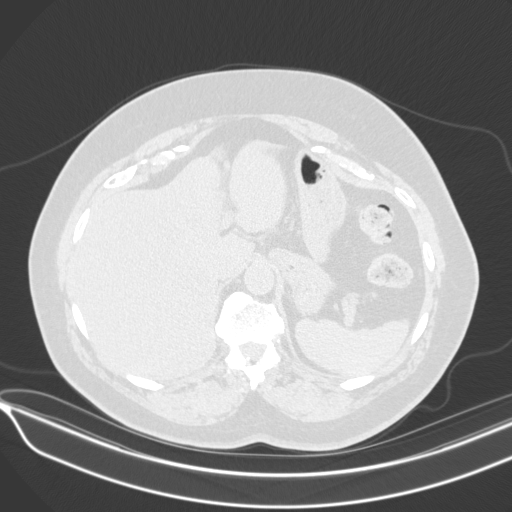
[im 50/150  lung]
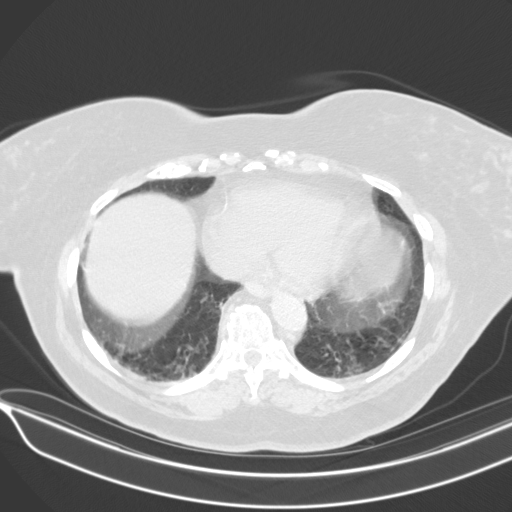
[im 71/150  lung]
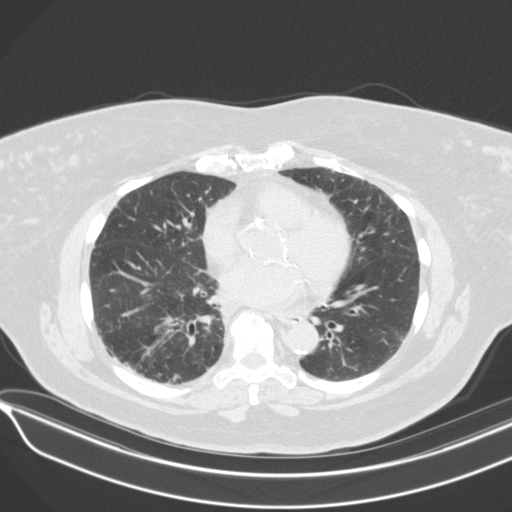
[im 75/150  lung]
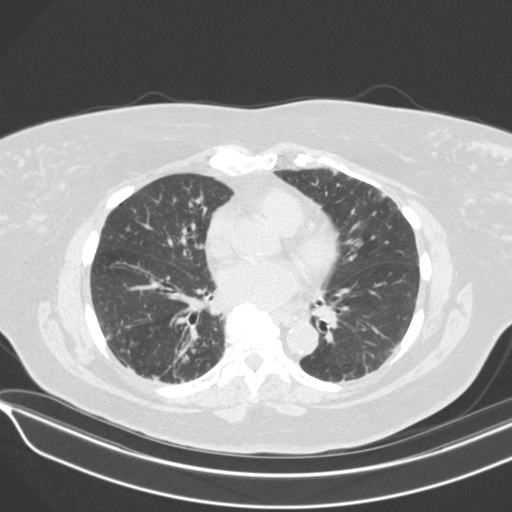
[im 100/150  mediastinal]
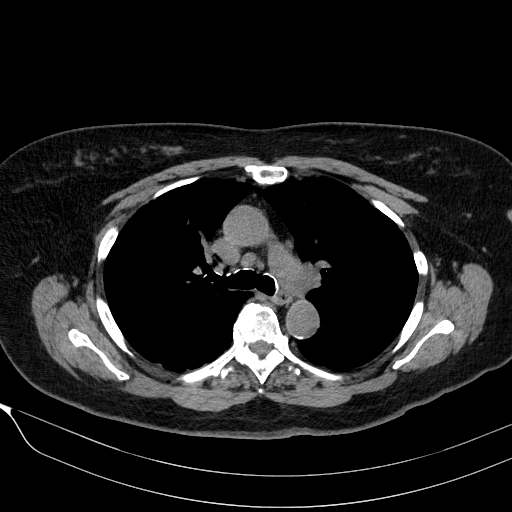
[im 100/150  lung]
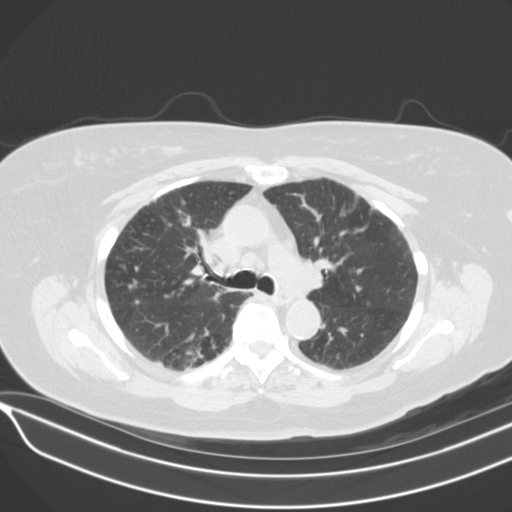
[im 125/150  lung]
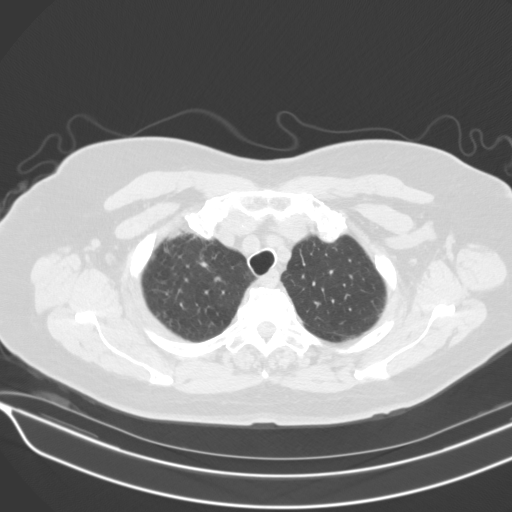

[Series 7: lungs · axial · 0.73mm/px · z∈[-198,-166]mm · 2 of 119 slices shown]
[im 24/119  lung]
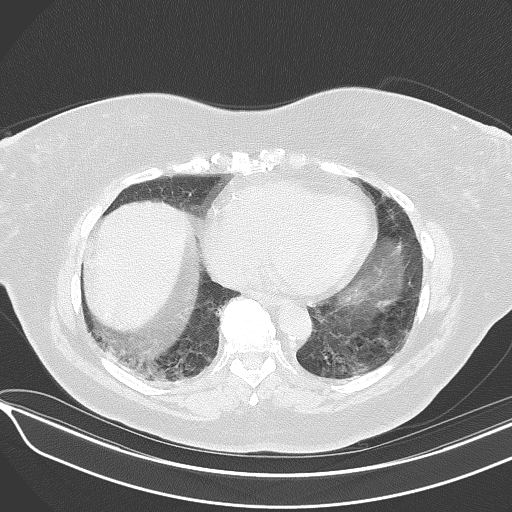
[im 40/119  lung]
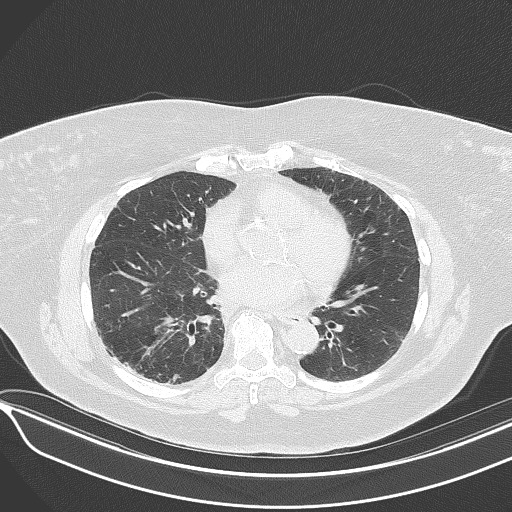

[Series 10: hi res 1mm · axial · 0.73mm/px · z∈[-224,-30]mm · 8 of 238 slices shown]
[im 22/238  lung]
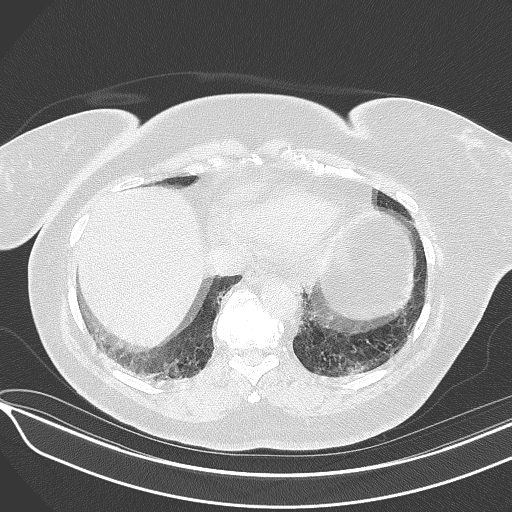
[im 65/238  lung]
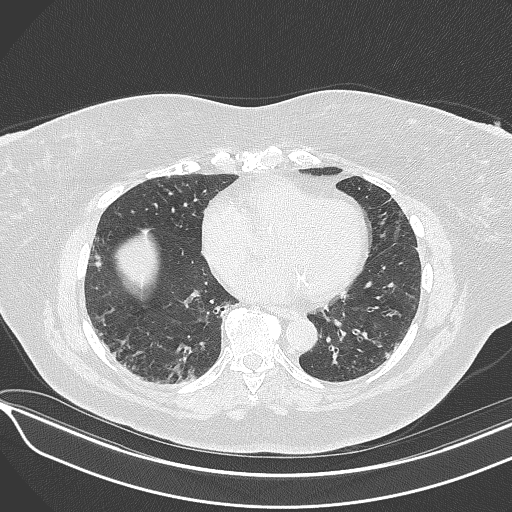
[im 79/238  lung]
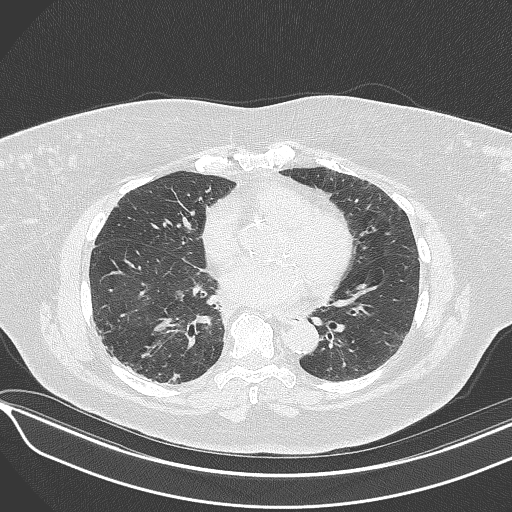
[im 87/238  lung]
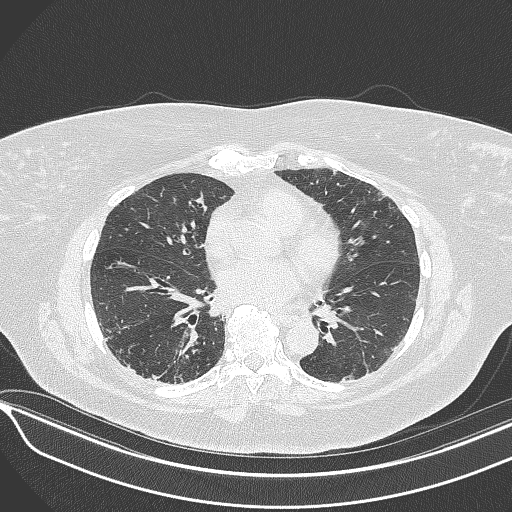
[im 130/238  lung]
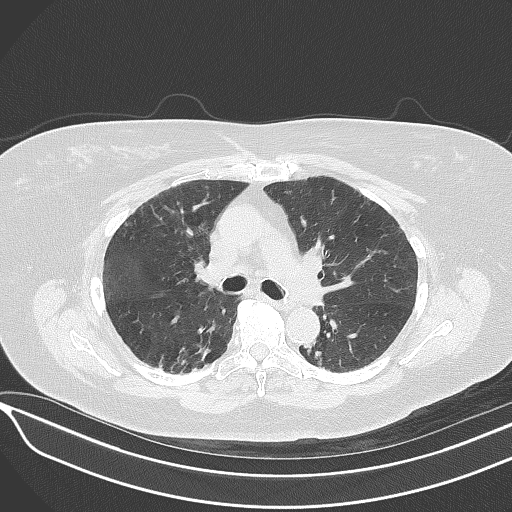
[im 151/238  lung]
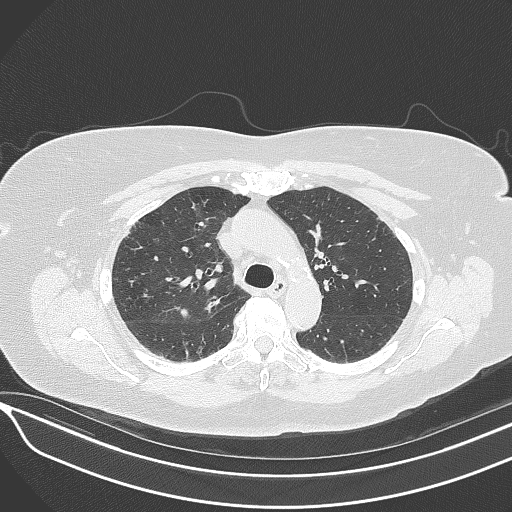
[im 173/238  lung]
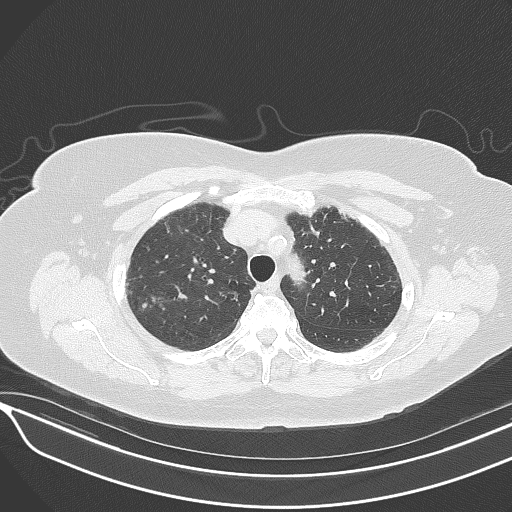
[im 216/238  lung]
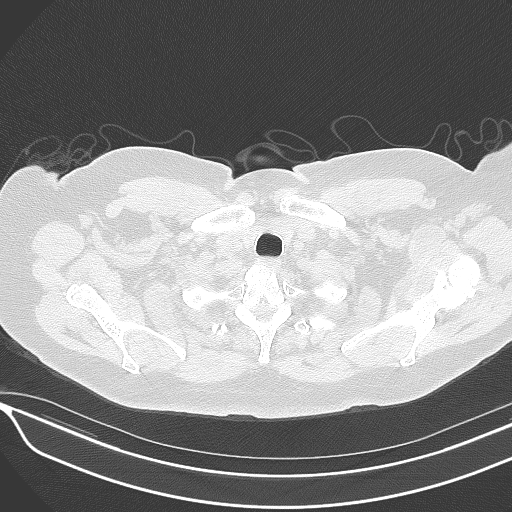

[16 of 34 positions shown; findings below may reference images not displayed]

FINDINGS: Motion degraded scan, limiting assessment.

Cardiovascular: Normal heart size. No significant pericardial
effusion/thickening. Three-vessel coronary atherosclerosis.
Atherosclerotic nonaneurysmal thoracic aorta. Normal caliber
pulmonary arteries.

Mediastinum/Nodes: No discrete thyroid nodules. Unremarkable
esophagus. No pathologically enlarged axillary, mediastinal or hilar
lymph nodes, noting limited sensitivity for the detection of hilar
adenopathy on this noncontrast study.

Lungs/Pleura: No pneumothorax. No pleural effusion. Widespread mild
to moderate cylindrical bronchiectasis with mild bronchial wall
thickening throughout both lungs, most prominent in lower lobes,
lingula and right middle lobe. Mild to moderate patchy tree-in-bud
opacities throughout both lungs at the areas of bronchiectasis, most
prominent in dependent right upper lobe and bilateral lower lobes. A
few scattered nodular foci of peripheral peribronchovascular
consolidation, for example measuring 1.2 cm in the posterior right
lower lobe (series 7/image 66) and 0.7 cm in the left lower lobe
(series 7/image 92). No lung masses. Scattered mild air trapping in
both lungs on the expiration sequence without evidence of
tracheobronchomalacia.

Upper abdomen: Diffuse hepatic steatosis.

Musculoskeletal: No aggressive appearing focal osseous lesions.
Marked thoracic spondylosis.
IMPRESSION: 1. Widespread mild to moderate cylindrical bronchiectasis, bronchial
wall thickening and patchy tree-in-bud opacities, most prominent in
the mid to lower lungs, compatible with chronic infectious or
inflammatory bronchiolitis such as due to atypical mycobacterial
infection (DOPPLER).
2. A few scattered nodular foci of peripheral peribronchovascular
consolidation, largest 1.2 cm in the posterior right lower lobe,
probably infectious/inflammatory such as due to DOPPLER. Suggest
attention on follow-up noncontrast chest CT in 3-6 months.
3. Scattered mild air trapping in both lungs, indicative of small
airways disease.
4. Three-vessel coronary atherosclerosis.
5. Diffuse hepatic steatosis.
6. Aortic Atherosclerosis ([id number]).

## 2022-01-12 ENCOUNTER — Other Ambulatory Visit (HOSPITAL_COMMUNITY): Payer: Self-pay

## 2022-01-12 MED ORDER — DORZOLAMIDE HCL-TIMOLOL MAL PF 2-0.5 % OP SOLN
1.0000 [drp] | Freq: Two times a day (BID) | OPHTHALMIC | 3 refills | Status: AC
  Filled 2022-01-12: qty 60, 30d supply, fill #0

## 2022-01-13 ENCOUNTER — Other Ambulatory Visit (HOSPITAL_COMMUNITY): Payer: Self-pay

## 2022-01-31 ENCOUNTER — Other Ambulatory Visit: Payer: Self-pay

## 2022-01-31 ENCOUNTER — Telehealth: Payer: Self-pay | Admitting: Internal Medicine

## 2022-01-31 MED ORDER — SPIRIVA RESPIMAT 2.5 MCG/ACT IN AERS: 2.0000 | INHALATION_SPRAY | Freq: Every day | RESPIRATORY_TRACT | 0 refills | Status: DC

## 2022-01-31 NOTE — Telephone Encounter (Signed)
Needs bridge samples while at daughter's.  Order- 2 samples Spiriva 2.5  to be left up front for daughter Teresa French to pick up for patient Teresa French  Thanks

## 2022-03-08 ENCOUNTER — Other Ambulatory Visit: Payer: Self-pay | Admitting: Internal Medicine

## 2022-03-08 DIAGNOSIS — Z1231 Encounter for screening mammogram for malignant neoplasm of breast: Secondary | ICD-10-CM

## 2022-03-22 ENCOUNTER — Other Ambulatory Visit: Payer: Self-pay

## 2022-03-22 ENCOUNTER — Ambulatory Visit: Payer: Medicare PPO | Admitting: Internal Medicine

## 2022-03-22 ENCOUNTER — Encounter: Payer: Self-pay | Admitting: Internal Medicine

## 2022-03-22 DIAGNOSIS — J471 Bronchiectasis with (acute) exacerbation: Secondary | ICD-10-CM

## 2022-03-22 NOTE — Progress Notes (Addendum)
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    Priority: High   COVID-19 09/16/2020   Asthmatic bronchitis 09/08/2020   OSA (obstructive sleep apnea) 09/08/2020   DVT (deep venous thrombosis) ([place]) 02/12/2019   Breast mass, left 07/10/2017   HTN (hypertension), benign 07/10/2017   Peripheral neuropathy 03/08/2016   Borderline diabetes    Rheumatoid arthritis ([place]) 10/29/2015   DJD (degenerative joint disease) 10/29/2015   Osteoporosis 10/29/2015   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   ALBUTEROL (VENTOLIN HFA) 108 (90 BASE) MCG/ACT INHALER    Inhale 2 puffs into the lungs every 4 (four) hours as needed for wheezing or shortness of breath.   AMITRIPTYLINE (ELAVIL) 25 MG TABLET    Take 25 mg by mouth at bedtime.   ASPIRIN EC 81 MG TABLET    Take 81 mg by mouth daily.   BRIMONIDINE (ALPHAGAN) 0.2 % OPHTHALMIC SOLUTION       CALCIUM CITRATE-VITAMIN D (CALCIUM CITRATE+D3 PETITES) 200-250 MG-UNIT TABS    Take by mouth.   CELECOXIB (CELEBREX) 100 MG CAPSULE    Take 1 capsule (100 mg total) by mouth 2 (two) times daily.   CETIRIZINE (ZYRTEC) 10 MG TABLET       CHLORTHALIDONE (HYGROTON) 25 MG TABLET    Take 25 mg by mouth daily.   DORZOLAMIDE HCL-TIMOLOL MAL PF 2-0.5 % SOLN       DORZOLAMIDE HCL-TIMOLOL MAL PF 2-0.5 % SOLN    Place 1 drop into affected eye 2 (two) times daily.   FLUTICASONE (FLONASE) 50 MCG/ACT NASAL SPRAY    1  spray by Each Nare route daily.   FLUTICASONE FUROATE-VILANTEROL (BREO ELLIPTA) 200-25 MCG/ACT AEPB    Inhale 1 puff then rinse mouth, daily   IBANDRONATE (BONIVA) 150 MG TABLET    Take 150 mg by mouth every 30 (thirty) days. Take in the morning with a full glass of water, on an empty stomach, and do not take anything else by mouth or lie down for the next 30 min.   LABETALOL (NORMODYNE) 100 MG TABLET       LATANOPROST (XALATAN) 0.005 % OPHTHALMIC SOLUTION    Place 1 drop into both eyes at bedtime.   LOSARTAN (COZAAR) 25 MG TABLET       MULTIPLE VITAMIN (MULTIVITAMIN) TABLET    Take 1 tablet by mouth daily.   NEOMYCIN-POLYMYXIN-HYDROCORTISONE (CORTISPORIN) 3.5-10000-1 OTIC SUSPENSION       OMEPRAZOLE (PRILOSEC) 20 MG CAPSULE    Take by mouth daily.   POTASSIUM CHLORIDE (KLOR-CON) 20 MEQ PACKET    Take 20 mEq by mouth daily.   PREDNISONE (DELTASONE) 5 MG TABLET    Take 5 mg by mouth daily.   PREGABALIN (LYRICA) 25 MG CAPSULE    Take 25 mg by mouth daily.   REFRESH LIQUIGEL 1 % GEL    See admin instructions.   RHOPRESSA 0.02 % SOLN  SMARTSIG:1 Drop(s) In Eye(s) Every Evening   ROSUVASTATIN (CRESTOR) 20 MG TABLET    Take 20 mg by mouth daily.   SYMBICORT 160-4.5 MCG/ACT INHALER    Inhale into the lungs.   TIOTROPIUM BROMIDE MONOHYDRATE (SPIRIVA RESPIMAT) 2.5 MCG/ACT AERS    Inhale 2 puffs into the lungs daily.  Modified Medications   No medications on file  Discontinued Medications   No medications on file    Subjective: Ms. Gabbert is in for a work-in visit.  She is accompanied by her daughter, [name], NP.  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  She had some worsening pulmonary symptoms and a chest CT scan showed evidence of bronchiectasis and new infiltrates.  A sputum culture obtained in July of 2022 grew Mycobacterium abscessus.  A repeat sputum culture in August of 2022 grew Mycobacterium abscessus again.  She saw her pulmonologist, Dr. Keturah Barre.   He prescribed a flutter valve and nebulized albuterol.    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  When I saw her in September of 2022 I did not feel that her symptoms were progressive or severe enough to warrant a very complicated the multidrug IV antibiotic regimen.  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  She has not noted any change in her chronic cough or dyspnea on exertion until this morning.  Her daughter, [name], has had a recent upper respiratory infection.  Both of them have had their annual influenza vaccines.  Neither of had an updated COVID-vaccine recently [place] tested negative for COVID recently.  Ms. Pledger says that she thinks that she may have been coughing a little bit more this morning and may have felt a little bit more short of breath but she is not sure.  She has not had any fever.    Review of Systems: Review of Systems  Constitutional:  Negative for fever and weight loss.  Respiratory:  Positive for cough, sputum production and shortness of breath. Negative for hemoptysis and wheezing.   Cardiovascular:  Negative for chest pain.    Past Medical History:  Diagnosis Date   Arthritis    Asthma    Borderline diabetes    Breast mass, left 07/10/2017   06/19/17: mammos: lobulated mass sup & lat to nipple; [place]: 1.4x0.5x1.0 cm  Bi-RADS4   Complication of anesthesia    "I SLEEP A VERY VERY LONG TIME"   Eczema    Environmental allergies    Glaucoma    History of transfusion    HTN (hypertension), benign 07/10/2017   Hyperlipidemia    Hypertension    Memory loss    Tingling    OF FEET    Social History   Tobacco Use   Smoking status: Never   Smokeless tobacco: Never  Vaping Use   Vaping Use: Never used  Substance Use Topics   Alcohol use: No   Drug use: No    Family History  Problem Relation Age of Onset   Heart disease Mother    Lung cancer Father    Breast cancer  Paternal Aunt        64s   Breast cancer Cousin 98   Breast cancer Cousin 40    No Known Allergies  Objective: Vitals:   03/22/22 1549  BP: 116/67  Pulse: 93  Temp: 98.4 F (36.9 C)  TempSrc: Oral  SpO2: 97%  Weight: 191 lb 12.8 oz (87  kg)   Body mass index is 31.92 kg/m.  Physical Exam Constitutional:      Comments: Ms. Pesnell is calm and pleasant as usual.  She is accompanied by [name] who obviously has a lot of upper respiratory congestion.  Cardiovascular:     Rate and Rhythm: Normal rate and regular rhythm.     Heart sounds: No murmur heard. Pulmonary:     Effort: Pulmonary effort is normal.     Breath sounds: Normal breath sounds. No wheezing, rhonchi or rales.  Psychiatric:        Mood and Affect: Mood normal.     Lab Results     Problem List Items Addressed This Visit       High   Bronchiectasis ([place])    Ms. Mcdougald may have some slight worsening of her chronic cough and shortness of breath but I reminded her that her symptoms have quite a bit of variability day today.  I do not see any changes that suggest worsening mycobacterial infection that would warrant further evaluation or treatment.  I will obtain sputum for a respiratory virus panel here today.  I asked them to do a home COVID test on his [place] this evening.  Assuming that that is negative I recommend getting an updated COVID-vaccine as soon as possible.  I will arrange phone follow-up in 1 week.      Relevant Orders    MYCOBACTERIA, CULTURE, WITH FLUOROCHROME SMEAR  Michel Bickers, MD [facility] [place] [phone] pager   [phone] cell 03/22/2022, 4:53 PM

## 2022-03-22 NOTE — Assessment & Plan Note (Signed)
Teresa French may have some slight worsening of her chronic cough and shortness of breath but I reminded her that her symptoms have quite a bit of variability day today.  I do not see any changes that suggest worsening mycobacterial infection that would warrant further evaluation or treatment.  I will obtain sputum for a respiratory virus panel here today.  I asked them to do a home COVID test on his [name] this evening.  Assuming that that is negative I recommend getting an updated COVID-vaccine as soon as possible.  I will arrange phone follow-up in 1 week.

## 2022-03-23 ENCOUNTER — Telehealth: Payer: Self-pay

## 2022-03-23 NOTE — Telephone Encounter (Signed)
Patient's daughter called, reports that patient's temperature was 100.5 this morning. She was not able to do an at-home COVID test because she ran out. Is planning on getting more later today. [name] would also like to know if they can proceed with surgery as planned despite patient's temperature.   Beryle Flock, RN

## 2022-03-23 NOTE — Telephone Encounter (Signed)
Called and spoke with [name], explained that Dr. Bladimir Auman Salon would want Adaora's surgeon to be aware that she has not been feeling well and has not been tested for COVID before proceeding with surgery. [name] verbalized understanding.   Beryle Flock, RN

## 2022-03-29 ENCOUNTER — Telehealth: Payer: Medicare PPO | Admitting: Internal Medicine

## 2022-04-08 ENCOUNTER — Telehealth: Payer: Self-pay | Admitting: Internal Medicine

## 2022-04-08 ENCOUNTER — Ambulatory Visit (INDEPENDENT_AMBULATORY_CARE_PROVIDER_SITE_OTHER): Payer: Medicare PPO

## 2022-04-08 ENCOUNTER — Other Ambulatory Visit: Payer: Self-pay

## 2022-04-08 ENCOUNTER — Other Ambulatory Visit (INDEPENDENT_AMBULATORY_CARE_PROVIDER_SITE_OTHER): Payer: Medicare PPO

## 2022-04-08 DIAGNOSIS — U071 COVID-19: Secondary | ICD-10-CM

## 2022-04-08 LAB — CBC WITH DIFFERENTIAL/PLATELET
Basophils Absolute: 0.1 10*3/uL (ref 0.0–0.1)
Basophils Relative: 0.6 % (ref 0.0–3.0)
Eosinophils Absolute: 0.2 10*3/uL (ref 0.0–0.7)
Eosinophils Relative: 2.3 % (ref 0.0–5.0)
HCT: 37.3 % (ref 36.0–46.0)
Hemoglobin: 12.3 g/dL (ref 12.0–15.0)
Lymphocytes Relative: 23.6 % (ref 12.0–46.0)
Lymphs Abs: 2.1 10*3/uL (ref 0.7–4.0)
MCHC: 33 g/dL (ref 30.0–36.0)
MCV: 87 fl (ref 78.0–100.0)
Monocytes Absolute: 0.6 10*3/uL (ref 0.1–1.0)
Monocytes Relative: 6.9 % (ref 3.0–12.0)
Neutro Abs: 5.8 10*3/uL (ref 1.4–7.7)
Neutrophils Relative %: 66.6 % (ref 43.0–77.0)
Platelets: 277 10*3/uL (ref 150.0–400.0)
RBC: 4.28 Mil/uL (ref 3.87–5.11)
RDW: 16 % — ABNORMAL HIGH (ref 11.5–15.5)
WBC: 8.7 10*3/uL (ref 4.0–10.5)

## 2022-04-08 LAB — D-DIMER, QUANTITATIVE: D-Dimer, Quant: 0.57 mcg/mL FEU — ABNORMAL HIGH (ref <0.50)

## 2022-04-08 LAB — COMPREHENSIVE METABOLIC PANEL
ALT: 15 U/L (ref 0–35)
AST: 20 U/L (ref 0–37)
Albumin: 4.3 g/dL (ref 3.5–5.2)
Alkaline Phosphatase: 53 U/L (ref 39–117)
BUN: 12 mg/dL (ref 6–23)
CO2: 31 mEq/L (ref 19–32)
Calcium: 9.5 mg/dL (ref 8.4–10.5)
Chloride: 95 mEq/L — ABNORMAL LOW (ref 96–112)
Creatinine, Ser: 0.75 mg/dL (ref 0.40–1.20)
GFR: 74.21 mL/min (ref >60.00)
Glucose, Bld: 139 mg/dL — ABNORMAL HIGH (ref 70–99)
Potassium: 3.9 mEq/L (ref 3.5–5.1)
Sodium: 136 mEq/L (ref 135–145)
Total Bilirubin: 0.5 mg/dL (ref 0.2–1.2)
Total Protein: 7.6 g/dL (ref 6.0–8.3)

## 2022-04-08 MED ORDER — DOXYCYCLINE HYCLATE 100 MG PO TABS: 100.0000 mg | ORAL_TABLET | Freq: Two times a day (BID) | ORAL | 0 refills | Status: DC

## 2022-04-08 NOTE — Telephone Encounter (Signed)
Called and spoke with patient and let her know that dr young has ordered blood work and chest xray. I told her to come in when she can and check in at the front and tell them she is here for chest xray and labs. Nothing further needed

## 2022-04-08 NOTE — Telephone Encounter (Signed)
Phone call- Has had pleuritic L chest pain x 2 days, worse lying down. Some cough. Recent Covid infection> molnupiravir. Plan- outpatient CXR and labs  Order- CXR- dx pleuritic left chest pain after Covid              Lab- CBC w diff, CMET,

## 2022-04-08 NOTE — Telephone Encounter (Signed)
  CXR to my reading looks like pneumonia in left base. While awaiting labs, I am sending doxycycline to [facility].

## 2022-04-13 ENCOUNTER — Encounter: Payer: Self-pay | Admitting: Cardiology

## 2022-04-13 ENCOUNTER — Ambulatory Visit: Payer: Medicare PPO | Admitting: Cardiology

## 2022-04-13 VITALS — BP 155/66 | HR 80 | Resp 16 | Ht 65.0 in | Wt 192.6 lb

## 2022-04-13 DIAGNOSIS — I10 Essential (primary) hypertension: Secondary | ICD-10-CM

## 2022-04-13 DIAGNOSIS — R0989 Other specified symptoms and signs involving the circulatory and respiratory systems: Secondary | ICD-10-CM

## 2022-04-13 DIAGNOSIS — I2510 Atherosclerotic heart disease of native coronary artery without angina pectoris: Secondary | ICD-10-CM

## 2022-04-13 DIAGNOSIS — R0602 Shortness of breath: Secondary | ICD-10-CM

## 2022-04-13 DIAGNOSIS — E7800 Pure hypercholesterolemia, unspecified: Secondary | ICD-10-CM

## 2022-04-13 DIAGNOSIS — J479 Bronchiectasis, uncomplicated: Secondary | ICD-10-CM

## 2022-04-13 MED ORDER — LOSARTAN POTASSIUM 50 MG PO TABS: 25.0000 mg | ORAL_TABLET | Freq: Every evening | ORAL | 1 refills | Status: DC

## 2022-04-13 NOTE — Progress Notes (Signed)
Primary Physician/Referring:  Elwyn Reach, MD  Patient ID: Teresa French, female    DOB: 1940/05/09, 82 y.o.   MRN: [id number]  Chief Complaint  Patient presents with   Shortness of Breath   HPI:    Teresa French  is a 82 y.o. 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  However over the past 6 months to year, patient states that her exercise capacity and dyspnea has markedly increased.  She also has episodes of orthopnea.  No leg edema, no chest pain or palpitations.  She is accompanied by her daughter who is a Designer, jewellery [name].  No dizziness, no palpitations, no syncope.  She has mild cognitive disorder MRI has been negative in the past and has been evaluated by neurology.  She follows Dr. Baird Lyons with regard to bronchiectasis.  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  Past Medical History:  Diagnosis Date   Arthritis    Asthma    Borderline diabetes    Breast mass, left 07/10/2017   06/19/17: mammos: lobulated mass sup & lat to nipple; [place]: 1.4x0.5x1.0 cm  Bi-RADS4   Complication of anesthesia    "I SLEEP A VERY VERY LONG TIME"   Eczema    Environmental allergies    Glaucoma    History of transfusion    HTN (hypertension), benign 07/10/2017   Hyperlipidemia    Hypertension    Memory loss    Tingling    OF FEET   Past Surgical History:  Procedure Laterality Date   BREAST BIOPSY Bilateral    3-5 EACH BREAST   BREAST CYST ASPIRATION     CERVICAL LAMINECTOMY     DILATION AND CURETTAGE OF UTERUS      GANGLION CYST EXCISION     X3   JOINT REPLACEMENT  2007   left hip   TOTAL HIP ARTHROPLASTY Right 11/21/2014   Procedure: RIGHT TOTAL HIP ARTHROPLASTY ANTERIOR APPROACH;  Surgeon: Mcarthur Rossetti, MD;  Location: [facility];  Service: Orthopedics;  Laterality: Right;   Family History  Problem Relation Age of Onset   Heart disease Mother    Lung cancer Father    Breast cancer Paternal Aunt        78s   Breast cancer Cousin 76   Breast cancer Cousin 22    Social History   Tobacco Use   Smoking status: Never   Smokeless tobacco: Never  Substance Use Topics   Alcohol use: No   Marital Status: Divorced  ROS  Review of Systems  Constitutional: Positive for malaise/fatigue.  Cardiovascular:  Positive for dyspnea on exertion. Negative for chest pain and leg swelling.   Objective      04/13/2022   11:53 AM 03/22/2022    3:49 PM 10/05/2021    3:59 PM  Vitals with BMI  Height '5\' 5"'$   '5\' 5"'$   Weight 192 lbs 10 oz 191 lbs 13 oz 182 lbs 3 oz  BMI 79.39  03.00  Systolic 923 300 762  Diastolic 66 67 79  Pulse 80 93 88   SpO2: 97 %   Physical Exam Constitutional:  Appearance: She is obese.  Neck:     Vascular: Carotid bruit (Left) present. No JVD.  Cardiovascular:     Rate and Rhythm: Normal rate and regular rhythm.     Pulses: Intact distal pulses.     Heart sounds: S1 normal and S2 normal. Murmur heard.     Early systolic murmur is present with a grade of 2/6 at the upper right sternal border.     No gallop.  Pulmonary:     Effort: Pulmonary effort is normal.     Breath sounds: Examination of the right-middle field reveals rhonchi. Examination of the right-lower field reveals rhonchi. Rhonchi present.  Abdominal:     General: Bowel sounds are normal.     Palpations: Abdomen is soft.  Musculoskeletal:     Right lower leg: No edema.     Left lower leg: No edema.     Medications and allergies  No Known Allergies   Medication list   Current Outpatient  Medications:    albuterol (PROVENTIL) (2.5 MG/3ML) 0.083% nebulizer solution, Take 3 mLs (2.5 mg total) by nebulization 2 (two) times daily as needed for wheezing or shortness of breath., Disp: 150 mL, Rfl: 12   albuterol (VENTOLIN HFA) 108 (90 Base) MCG/ACT inhaler, Inhale 2 puffs into the lungs every 4 (four) hours as needed for wheezing or shortness of breath., Disp: 18 g, Rfl: 12   amitriptyline (ELAVIL) 25 MG tablet, Take 25 mg by mouth at bedtime., Disp: , Rfl: 3   aspirin EC 81 MG tablet, Take 81 mg by mouth daily., Disp: , Rfl:    Calcium Citrate-Vitamin D (CALCIUM CITRATE+D3 PETITES) 200-250 MG-UNIT TABS, Take by mouth., Disp: , Rfl:    celecoxib (CELEBREX) 100 MG capsule, Take 1 capsule (100 mg total) by mouth 2 (two) times daily., Disp: 60 capsule, Rfl: 0   cetirizine (ZYRTEC) 10 MG tablet, Take 10 mg by mouth at bedtime., Disp: , Rfl:    chlorthalidone (HYGROTON) 25 MG tablet, Take 25 mg by mouth every morning., Disp: , Rfl:    Dorzolamide HCl-Timolol Mal PF 2-0.5 % SOLN, Place 1 drop into affected eye 2 (two) times daily., Disp: 60 each, Rfl: 3   fluticasone (FLONASE) 50 MCG/ACT nasal spray, 1 spray by Each Nare route daily., Disp: , Rfl:    fluticasone furoate-vilanterol (BREO ELLIPTA) 200-25 MCG/ACT AEPB, Inhale 1 puff then rinse mouth, daily, Disp: 60 each, Rfl: 12   labetalol (NORMODYNE) 100 MG tablet, Take 100 mg by mouth 2 (two) times daily., Disp: , Rfl:    latanoprost (XALATAN) 0.005 % ophthalmic solution, Place 1 drop into both eyes at bedtime., Disp: , Rfl:    Multiple Vitamin (MULTIVITAMIN) tablet, Take 1 tablet by mouth daily., Disp: , Rfl:    omeprazole (PRILOSEC) 20 MG capsule, Take by mouth daily., Disp: , Rfl:    predniSONE (DELTASONE) 5 MG tablet, Take 5 mg by mouth daily., Disp: , Rfl:    pregabalin (LYRICA) 25 MG capsule, Take 25 mg by mouth daily., Disp: , Rfl:    REFRESH LIQUIGEL 1 % GEL, See admin instructions., Disp: , Rfl:    rosuvastatin (CRESTOR) 20 MG  tablet, Take 20 mg by mouth daily., Disp: , Rfl:    losartan (COZAAR) 50 MG tablet, Take 0.5 tablets (25 mg total) by mouth every evening., Disp: 90 tablet, Rfl: 1   potassium chloride (KLOR-CON) 20 MEQ packet, Take 20 mEq by mouth daily. (Patient not taking: Reported on 04/13/2022), Disp: , Rfl:  Laboratory  examination:   Lab Results  Component Value Date   NA 136 04/08/2022   K 3.9 04/08/2022   CO2 31 04/08/2022   GLUCOSE 139 (H) 04/08/2022   BUN 12 04/08/2022   CREATININE 0.75 04/08/2022   CALCIUM 9.5 04/08/2022   GFRNONAA 69 07/10/2017     Lab Results  Component Value Date   GLUCOSE 139 (H) 04/08/2022   NA 136 04/08/2022   K 3.9 04/08/2022   CL 95 (L) 04/08/2022   CO2 31 04/08/2022   BUN 12 04/08/2022   CREATININE 0.75 04/08/2022   GFRNONAA 69 07/10/2017   CALCIUM 9.5 04/08/2022   PROT 7.6 04/08/2022   ALBUMIN 4.3 04/08/2022   LABGLOB 3.0 07/10/2017   AGRATIO 1.5 07/10/2017   BILITOT 0.5 04/08/2022   ALKPHOS 53 04/08/2022   AST 20 04/08/2022   ALT 15 04/08/2022   ANIONGAP 7 11/22/2014      Lab Results  Component Value Date   ALT 15 04/08/2022   AST 20 04/08/2022   ALKPHOS 53 04/08/2022   BILITOT 0.5 04/08/2022       Latest Ref Rng & Units 04/08/2022    2:06 PM 01/23/2018    3:07 PM 07/10/2017    2:57 PM  Hepatic Function  Total Protein 6.0 - 8.3 g/dL 7.6  7.2  7.5   Albumin 3.5 - 5.2 g/dL 4.3   4.5   AST 0 - 37 U/L 20  21  32   ALT 0 - 35 U/L '15  12  16   '$ Alk Phosphatase 39 - 117 U/L 53   75   Total Bilirubin 0.2 - 1.2 mg/dL 0.5  0.4  0.3    HEMOGLOBIN A1C Lab Results  Component Value Date   HGBA1C 6.2 (H) 01/23/2018   MPG 131 01/23/2018   External labs:   NA  Radiology:   High-resolution CT 09/29/2020: 1. Widespread mild to moderate cylindrical bronchiectasis, bronchial wall thickening and patchy tree-in-bud opacities, most prominent in the mid to lower lungs, compatible with chronic infectious or inflammatory bronchiolitis such as due to  atypical mycobacterial infection (MAI). 2. A few scattered nodular foci of peripheral peri-bronchovascular consolidation, largest 1.2 cm in the posterior right lower lobe, probably infectious/inflammatory  such as due to MAI. Suggest  attention on follow-up noncontrast chest CT in 3-6 months. 3. Scattered mild air trapping in both lungs, indicative of small airways disease. 4. Three-vessel coronary atherosclerosis. 5. Diffuse hepatic steatosis. 6. Aortic Atherosclerosis  Cardiac Studies:   Carotid artery duplex 04/30/2022: Duplex suggests stenosis in the right internal carotid artery (1-15%). Duplex suggests stenosis in the left internal carotid artery (1-15%). There is mild heterogenous plaque noted in the bilateral carotid arteries. Antegrade right vertebral artery flow. Antegrade left vertebral artery flow.  Echocardiogram 2022/04/30:  Left ventricle cavity is normal in size. Moderate concentric hypertrophy of the left ventricle. Normal global wall motion. Normal diastolic filling pattern. Normal LV systolic function with EF 66%. Calculated EF 66%. Trileaflet aortic valve with no regurgitation. Mild calcification of the aortic valve annulus. Moderate aortic valve leaflet calcification. Mildly restricted aortic valve leaflets. Mild aortic valve stenosis. Peak velocity  2.69ms, Peak Pressure Gradient  27.2 mmHg, Mean Gradient 140mg, AVA 1.3cm, Dimensionless Index 0.4 Structurally normal tricuspid valve with trace regurgitation. No evidence of pulmonary hypertension.  EKG:   EKG 04/13/2022: Normal sinus rhythm at rate of 75 bpm, left atrial enlargement, left axis deviation, left anterior fascicular block.  Right bundle branch block.  Bifascicular block. LVH.  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    Assessment     ICD-10-CM   1. Shortness of breath  R06.02 EKG 12-Lead    PCV ECHOCARDIOGRAM COMPLETE    Pro b natriuretic peptide (BNP)    PCV MYOCARDIAL PERFUSION WITH LEXISCAN     2. Bronchiectasis without complication ([place])  T41.9     3. Primary hypertension  I10 losartan (COZAAR) 50 MG tablet    Basic metabolic panel    4. Pure hypercholesterolemia  E78.00     5. Left carotid bruit  R09.89 PCV CAROTID DUPLEX (BILATERAL)    PCV MYOCARDIAL PERFUSION WITH LEXISCAN    6. Coronary artery calcification seen on CAT scan  I25.10 PCV MYOCARDIAL PERFUSION WITH LEXISCAN       Orders Placed This Encounter  Procedures   Pro b natriuretic peptide (BNP)   Basic metabolic panel   PCV MYOCARDIAL PERFUSION WITH LEXISCAN    Standing Status:   Future    Standing Expiration Date:   06/12/2022   EKG 12-Lead   PCV ECHOCARDIOGRAM COMPLETE    Standing Status:   Future    Number of Occurrences:   1    Standing Expiration Date:   04/14/2023    Meds ordered this encounter  Medications   losartan (COZAAR) 50 MG tablet    Sig: Take 0.5 tablets (25 mg total) by mouth every evening.    Dispense:  90 tablet    Refill:  1    Medications Discontinued During This Encounter  Medication Reason   RHOPRESSA 0.02 % SOLN Allergic reaction   Dorzolamide HCl-Timolol Mal PF 2-0.5 % SOLN    ibandronate (BONIVA) 150 MG tablet    neomycin-polymyxin-hydrocortisone (CORTISPORIN) 3.5-10000-1 OTIC suspension    SYMBICORT 160-4.5 MCG/ACT inhaler Cost of medication   Tiotropium Bromide Monohydrate (SPIRIVA RESPIMAT) 2.5 MCG/ACT AERS Cost of medication   brimonidine (ALPHAGAN) 0.2 % ophthalmic solution    doxycycline (VIBRA-TABS) 100 MG tablet Completed Course   losartan (COZAAR) 25 MG tablet Reorder     Recommendations:   Teresa French is a 82 y.o.  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  1. Shortness of breath Patient's shortness of breath is multifactorial.  Probably mild obesity, MAI induced bronchiectasis, deconditioning and age contributing along with hypertension.  Chronic diastolic heart failure need to be excluded, will obtain echocardiogram and also NT proBNP, will will follow-up on this.  Her dyspnea and fatigue may be related to mild OSA and severe nocturnal hypoxemia noted on HST 11/01/2020.  She may benefit from nocturnal oxygen supplementation, we could consider nocturnal oximetry.  2. Bronchiectasis without complication ([place]) I reviewed Dr. Maren Reamer notes, she also has severe three-vessel coronary calcification noted by CT scan in 2022.  In view of her cardiovascular risks that include hypertension, hyperlipidemia, hyperglycemia, obesity and age, will obtain Lexiscan nuclear stress test.  She is unable to walk the treadmill due to dyspnea.  3. Primary hypertension Blood pressure is elevated, will increase losartan to 50 mg daily.  Will obtain BMP in 2 weeks following this.  4. Pure hypercholesterolemia Patient is presently on a statin with Crestor 20 mg daily, continue the same, will follow-up with her lipids from her PCP  office labs.  5. Left carotid bruit She has a very prominent left carotid bruit, will obtain carotid duplex.  6. Coronary artery calcification seen on CAT scan As dictated above, she is presently on aspirin, statin, beta-blocker and an ARB.  Will obtain pharmacologic cardiac nuclear stress test and make further recommendations.  "Total time spent with patient was 69 minutes and greater than 50% of that time was spent in counseling and coordination care with the patient and/or family regarding above and discussions regarding risks and review of external labs and records.    (Echo and Carotid artery duplex were reviewed 04/16/22 and patient aware   Adrian Prows, MD,  [facility] 04/16/2022, 11:26 AM Office: [phone]

## 2022-04-14 ENCOUNTER — Encounter: Payer: Self-pay | Admitting: Cardiology

## 2022-04-15 ENCOUNTER — Ambulatory Visit: Payer: Medicare PPO

## 2022-04-15 DIAGNOSIS — R0989 Other specified symptoms and signs involving the circulatory and respiratory systems: Secondary | ICD-10-CM

## 2022-04-15 DIAGNOSIS — R0602 Shortness of breath: Secondary | ICD-10-CM

## 2022-04-16 ENCOUNTER — Telehealth: Payer: Self-pay | Admitting: Internal Medicine

## 2022-04-16 MED ORDER — LOSARTAN POTASSIUM 50 MG PO TABS: 50.0000 mg | ORAL_TABLET | Freq: Every evening | ORAL | 1 refills | Status: DC

## 2022-04-16 MED ORDER — LOSARTAN POTASSIUM 50 MG PO TABS: 50.0000 mg | ORAL_TABLET | Freq: Every evening | ORAL | 1 refills | Status: AC

## 2022-04-16 NOTE — Telephone Encounter (Signed)
Phone from daughter, [name], NP. Dr Einar Gip evaluating for cardiology. Need next available held-spot that daughter can arrange to get office visit with me to f/u OSA. Daughter [name] is contact number at her work- [phone].

## 2022-04-18 NOTE — Telephone Encounter (Signed)
ATC X2 LVM for daughter to call our office back. Advised there was appointment available for 11:30 tomorrow. Will leave encounter open until she calls back

## 2022-04-19 ENCOUNTER — Ambulatory Visit: Payer: Medicare PPO | Admitting: Internal Medicine

## 2022-04-19 ENCOUNTER — Encounter: Payer: Self-pay | Admitting: Internal Medicine

## 2022-04-19 VITALS — BP 144/64 | HR 80 | Ht 65.0 in | Wt 192.0 lb

## 2022-04-19 DIAGNOSIS — G4734 Idiopathic sleep related nonobstructive alveolar hypoventilation: Secondary | ICD-10-CM

## 2022-04-19 DIAGNOSIS — A318 Other mycobacterial infections: Secondary | ICD-10-CM

## 2022-04-19 DIAGNOSIS — I509 Heart failure, unspecified: Secondary | ICD-10-CM | POA: Diagnosis not present

## 2022-04-19 DIAGNOSIS — J479 Bronchiectasis, uncomplicated: Secondary | ICD-10-CM

## 2022-04-19 DIAGNOSIS — G4733 Obstructive sleep apnea (adult) (pediatric): Secondary | ICD-10-CM

## 2022-04-19 LAB — BASIC METABOLIC PANEL
BUN: 9 mg/dL (ref 6–23)
CO2: 29 mEq/L (ref 19–32)
Calcium: 9.5 mg/dL (ref 8.4–10.5)
Chloride: 96 mEq/L (ref 96–112)
Creatinine, Ser: 0.73 mg/dL (ref 0.40–1.20)
GFR: 76.64 mL/min (ref >60.00)
Glucose, Bld: 189 mg/dL — ABNORMAL HIGH (ref 70–99)
Potassium: 3.4 mEq/L — ABNORMAL LOW (ref 3.5–5.1)
Sodium: 135 mEq/L (ref 135–145)

## 2022-04-19 NOTE — Assessment & Plan Note (Signed)
Very mild persistent cough does not seem to disturb her much.  She continues maintenance prednisone but is not using inhalers. Plan-observation

## 2022-04-19 NOTE — Patient Instructions (Signed)
Order- schedule overnight oximetry    dx nocturnal hypoxemia  Order- lab- BMET, ProBNP     DX CHF

## 2022-04-19 NOTE — Progress Notes (Signed)
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 HST 11/01/20- AHI11.4/ hr, desaturation to 70%, body weight 188 lbs  ================================================================================   06/10/21- 74 yoF never smoker, mother of Teresa Mire, NP, followed for Bronchiectasis/ MAIC/ M abscessus), OSA/ OAP, complicated by  HTN, DVT, CAD, Aortic Atherosclerosis,  Peripheral Neuropathy, Rheumatoid Arthritis , Osteoporosis, Hyperlipidemia, Glaucoma, L Breast Mass, Environmental Allergies, Memory Loss, Covid Infection Jan 2022,                                           -Symbicort 160,  Ventolin hfa,  Neb albuterol, Flonase       note Timoptic They have chosen to continue observation of M.abscessus after discussing Rx w Dr Megan Salon. Here with her daughter.  Family is aware there appears to be some dementia. Using rescue inhaler 2 or 3 times a day.  Feels Symbicort is expensive.  No significant exacerbation.  We will try switch to [facility]. She never followed through previously on referral for oral appliance but agrees to meet Dr. Ron Parker now.  04/19/22- 81 yoF never smoker, mother of Teresa Mire, NP, followed for Bronchiectasis/ MAIC/ M abscessus), OSA/ OAP, complicated by  HTN, DVT, CAD, Aortic Atherosclerosis, CHF,  Peripheral Neuropathy, Rheumatoid Arthritis , Osteoporosis, Hyperlipidemia, Glaucoma, L Breast Mass, Environmental Allergies, Memory Loss, Covid Infection Jan 2022,                                           - Ventolin hfa,  Neb albuterol, Flonase, prednisone 5 mg daily,        note Timoptic Body weight today- Covid vax-4 Phizer Flu vax-had Never received CPAP machine. [facility] has no record.  After discussion with daughter today, we are going to check overnight oximetry and wait on CPAP decision. She was fortunate to get evaluation for cardiac status by Dr. Einar Gip.  His note is reviewed.  He asked for some labs to help as he evaluates cardiac status. She and her family had COVID infection last month.  She is largely back to baseline now. Dr. 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  Symptoms are minimal so we will follow conservatively.  Dr. Megan Salon is retiring in April. CXR 04/10/22- MPRESSION: Moderate chronic bilateral interstitial thickening, not significantly changed from multiple prior radiographs. No acute airspace opacity.  ROS-see HPI   + = positive Constitutional:    weight loss, night sweats, fevers, chills, +fatigue, lassitude. HEENT:    headaches, difficulty swallowing, tooth/dental problems, sore throat,       +sneezing, itching, ear ache, +nasal congestion, post nasal drip, snoring CV:    chest pain, orthopnea, PND, +swelling in lower extremities, anasarca,                                   dizziness, palpitations Resp:   +shortness of breath with exertion or at rest.                +productive cough,  non-productive cough, coughing up of blood.              +change in color of mucus.  wheezing.   Skin:    +rash or lesions. GI:  No-   heartburn, indigestion, abdominal pain, nausea, vomiting, diarrhea,                 change in bowel habits, loss of appetite GU: dysuria, change in color of urine, no urgency or frequency.   flank pain. MS:   joint pain, stiffness, decreased range of motion, back pain. Neuro-     nothing unusual Psych:  change in mood or affect.  depression or anxiety.   memory loss.  OBJ- Physical Exam General- Alert, Oriented, Affect-appropriate, Distress- none acute, + obese Skin- rash-none, lesions- none, excoriation- none Lymphadenopathy- none Head- atraumatic            Eyes- Gross vision intact, PERRLA,  conjunctivae and secretions clear            Ears- Hearing, canals-normal            Nose- Clear, no-Septal dev, mucus, polyps, erosion, perforation             Throat- Mallampati IV , mucosa clear , drainage- none, tonsils- atrophic, +teeth Neck- flexible , trachea midline, no stridor , thyroid nl, carotid no bruit Chest - symmetrical excursion , unlabored           Heart/CV- RRR , no murmur , no gallop  , no rub, nl s1 s2                           - JVD- none , edema- none, stasis changes- none, varices- none           Lung- +few faint crackles, wheeze- none, cough- none , dullness-none, rub- none           Chest wall-  Abd-  Br/ Gen/ Rectal- Not done, not indicated Extrem- cyanosis- none, clubbing, none, atrophy- none, strength- nl Neuro- grossly intact to observation

## 2022-04-19 NOTE — Assessment & Plan Note (Addendum)
Mild.  Cardiac status is being evaluated now so we will try to assist this with overnight oximetry Plan-schedule overnight oximetry on room air. Labs for BMET, proBNP

## 2022-04-19 NOTE — Assessment & Plan Note (Signed)
Dr. 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 Plan-follow with observation

## 2022-04-19 NOTE — Telephone Encounter (Signed)
Spoke with daughter. Patient has been scheduled for 1:30 today. Nothing further needed.

## 2022-04-20 LAB — PRO B NATRIURETIC PEPTIDE: NT-Pro BNP: 169 pg/mL (ref 0–738)

## 2022-05-02 ENCOUNTER — Other Ambulatory Visit: Payer: Medicare PPO

## 2022-05-03 ENCOUNTER — Ambulatory Visit: Payer: Medicare PPO

## 2022-05-03 LAB — MYCOBACTERIA,CULT W/FLUOROCHROME SMEAR
MICRO NUMBER:: 14381321
SPECIMEN QUALITY:: ADEQUATE

## 2022-05-23 ENCOUNTER — Ambulatory Visit
Admission: RE | Admit: 2022-05-23 | Discharge: 2022-05-23 | Disposition: A | Discharge status: Home / Self Care | Payer: Medicare PPO | Source: Ambulatory Visit | Attending: Internal Medicine | Admitting: Internal Medicine

## 2022-05-23 ENCOUNTER — Ambulatory Visit: Payer: Medicare PPO

## 2022-05-23 DIAGNOSIS — R0989 Other specified symptoms and signs involving the circulatory and respiratory systems: Secondary | ICD-10-CM

## 2022-05-23 DIAGNOSIS — Z1231 Encounter for screening mammogram for malignant neoplasm of breast: Secondary | ICD-10-CM

## 2022-05-23 DIAGNOSIS — R0602 Shortness of breath: Secondary | ICD-10-CM

## 2022-05-23 DIAGNOSIS — I2510 Atherosclerotic heart disease of native coronary artery without angina pectoris: Secondary | ICD-10-CM

## 2022-05-23 LAB — PCV MYOCARDIAL PERFUSION WITH LEXISCAN: ST Depression (mm): 0 mm

## 2022-05-26 ENCOUNTER — Ambulatory Visit: Payer: Medicare PPO | Admitting: Cardiology

## 2022-05-31 ENCOUNTER — Ambulatory Visit: Payer: Medicare PPO | Admitting: Internal Medicine

## 2022-06-01 ENCOUNTER — Encounter: Payer: Self-pay | Admitting: Cardiology

## 2022-06-01 ENCOUNTER — Ambulatory Visit: Payer: Medicare PPO | Admitting: Cardiology

## 2022-06-01 ENCOUNTER — Telehealth: Payer: Self-pay | Admitting: Internal Medicine

## 2022-06-01 VITALS — BP 140/77 | HR 60 | Resp 16 | Ht 65.0 in | Wt 192.8 lb

## 2022-06-01 DIAGNOSIS — R0602 Shortness of breath: Secondary | ICD-10-CM

## 2022-06-01 DIAGNOSIS — I350 Nonrheumatic aortic (valve) stenosis: Secondary | ICD-10-CM

## 2022-06-01 DIAGNOSIS — G4734 Idiopathic sleep related nonobstructive alveolar hypoventilation: Secondary | ICD-10-CM

## 2022-06-01 DIAGNOSIS — J479 Bronchiectasis, uncomplicated: Secondary | ICD-10-CM

## 2022-06-01 DIAGNOSIS — I10 Essential (primary) hypertension: Secondary | ICD-10-CM

## 2022-06-01 NOTE — Telephone Encounter (Signed)
Daughter Teresa French called to report that Dr Einar Gip raised concern of pulmonary fibrosis. I noted HRCT in 2022 which had described mild to moderate bronchiectasis and concern of atypical infection. She has been evaluated by ID for that. I suggested we update HRCT, looking for progression and evidence of any fibrosis.  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     Contact is daughter Teresa French, with whom patient is currently staying.

## 2022-06-01 NOTE — Progress Notes (Signed)
Primary Physician/Referring:  Elwyn Reach, MD  Patient ID: Teresa French, female    DOB: 29-Dec-1940, 82 y.o.   MRN: [id number]  No chief complaint on file.  HPI:    Alyxis Lanius  is a 82 y.o. 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  Patient has been having dyspnea on exertion for many years.  However over the past 6 months to year, patient states that her exercise capacity and dyspnea has markedly increased.  No leg edema, no chest pain or palpitations.  She is accompanied by her daughter who is a Designer, jewellery Ms. [name].    She has bronchiectasis probably related to prior infectious insult.  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  Past Medical History:  Diagnosis Date   Arthritis    Asthma    Borderline diabetes    Breast mass, left 07/10/2017   06/19/17: mammos: lobulated mass sup & lat to nipple; [place]: 1.4x0.5x1.0 cm  Bi-RADS4   Complication of anesthesia    "I SLEEP A VERY VERY LONG TIME"   Eczema    Environmental allergies    Glaucoma    History of transfusion    HTN (hypertension), benign 07/10/2017   Hyperlipidemia    Hypertension    Memory loss    Tingling    OF FEET   Past Surgical History:  Procedure Laterality Date   BREAST BIOPSY Bilateral    3-5 EACH BREAST   BREAST CYST ASPIRATION     CERVICAL LAMINECTOMY     DILATION AND CURETTAGE OF UTERUS     GANGLION CYST EXCISION     X3   JOINT REPLACEMENT  2007   left hip   TOTAL HIP ARTHROPLASTY Right 11/21/2014   Procedure: RIGHT TOTAL HIP ARTHROPLASTY ANTERIOR APPROACH;  Surgeon:  Mcarthur Rossetti, MD;  Location: [facility];  Service: Orthopedics;  Laterality: Right;   Family History  Problem Relation Age of Onset   Heart disease Mother    Lung cancer Father    Breast cancer Paternal Aunt        74s   Breast cancer Cousin 77   Breast cancer Cousin 26    Social History   Tobacco Use   Smoking status: Never   Smokeless tobacco: Never  Substance Use Topics   Alcohol use: No   Marital Status: Divorced  ROS  Review of Systems  Constitutional: Positive for malaise/fatigue.  Cardiovascular:  Positive for dyspnea on exertion. Negative for chest pain and leg swelling.   Objective      06/01/2022    3:20 PM 04/19/2022    1:37 PM 04/13/2022   11:53 AM  Vitals with BMI  Height '5\' 5"'$  '5\' 5"'$  '5\' 5"'$   Weight 192 lbs 13 oz 192 lbs 192 lbs 10 oz  BMI 32.08 [id number]  Systolic [id number]  Diastolic 77 64 66  Pulse 60 80 80   SpO2: 98 %   Physical Exam Constitutional:      Appearance: She is obese.  Neck:     Vascular: Carotid bruit (Left) present. No JVD.  Cardiovascular:     Rate and Rhythm: Normal rate and regular rhythm.  Pulses: Intact distal pulses.     Heart sounds: S1 normal and S2 normal. Murmur heard.     Early systolic murmur is present with a grade of 2/6 at the upper right sternal border.     No gallop.  Pulmonary:     Effort: Pulmonary effort is normal.     Breath sounds: Examination of the right-middle field reveals rhonchi. Examination of the right-lower field reveals rhonchi. Rhonchi present.  Abdominal:     General: Bowel sounds are normal.     Palpations: Abdomen is soft.  Musculoskeletal:     Right lower leg: No edema.     Left lower leg: No edema.     Medications and allergies  No Known Allergies   Medication list   Current Outpatient Medications:    albuterol (VENTOLIN HFA) 108 (90 Base) MCG/ACT inhaler, Inhale 2 puffs into the lungs every 4 (four) hours as needed for wheezing or shortness of breath., Disp: 18 g, Rfl:  12   aspirin EC 81 MG tablet, Take 81 mg by mouth daily., Disp: , Rfl:    Calcium Citrate-Vitamin D (CALCIUM CITRATE+D3 PETITES) 200-250 MG-UNIT TABS, Take by mouth., Disp: , Rfl:    celecoxib (CELEBREX) 100 MG capsule, Take 1 capsule (100 mg total) by mouth 2 (two) times daily., Disp: 60 capsule, Rfl: 0   cetirizine (ZYRTEC) 10 MG tablet, Take 10 mg by mouth at bedtime., Disp: , Rfl:    chlorthalidone (HYGROTON) 25 MG tablet, Take 25 mg by mouth daily., Disp: , Rfl:    Dorzolamide HCl-Timolol Mal PF 2-0.5 % SOLN, Place 1 drop into affected eye 2 (two) times daily., Disp: 60 each, Rfl: 3   fluticasone (FLONASE) 50 MCG/ACT nasal spray, 1 spray by Each Nare route daily., Disp: , Rfl:    fluticasone furoate-vilanterol (BREO ELLIPTA) 200-25 MCG/ACT AEPB, Inhale 1 puff then rinse mouth, daily, Disp: 60 each, Rfl: 12   labetalol (NORMODYNE) 100 MG tablet, Take 100 mg by mouth 2 (two) times daily., Disp: , Rfl:    latanoprost (XALATAN) 0.005 % ophthalmic solution, Place 1 drop into both eyes at bedtime., Disp: , Rfl:    losartan (COZAAR) 50 MG tablet, Take 1 tablet (50 mg total) by mouth every evening., Disp: 90 tablet, Rfl: 1   Multiple Vitamin (MULTIVITAMIN) tablet, Take 1 tablet by mouth daily., Disp: , Rfl:    omeprazole (PRILOSEC) 20 MG capsule, Take by mouth daily., Disp: , Rfl:    potassium chloride (KLOR-CON) 20 MEQ packet, Take 20 mEq by mouth daily., Disp: , Rfl:    predniSONE (DELTASONE) 5 MG tablet, Take 5 mg by mouth daily., Disp: , Rfl:    pregabalin (LYRICA) 25 MG capsule, Take 25 mg by mouth daily., Disp: , Rfl:    REFRESH LIQUIGEL 1 % GEL, See admin instructions., Disp: , Rfl:    rosuvastatin (CRESTOR) 20 MG tablet, Take 20 mg by mouth daily., Disp: , Rfl:    albuterol (PROVENTIL) (2.5 MG/3ML) 0.083% nebulizer solution, Take 3 mLs (2.5 mg total) by nebulization 2 (two) times daily as needed for wheezing or shortness of breath., Disp: 150 mL, Rfl: 12 Laboratory examination:   Lab  Results  Component Value Date   NA 135 04/19/2022   K 3.4 (L) 04/19/2022   CO2 29 04/19/2022   GLUCOSE 189 (H) 04/19/2022   BUN 9 04/19/2022   CREATININE 0.73 04/19/2022   CALCIUM 9.5 04/19/2022   GFRNONAA 69 07/10/2017     Lab Results  Component Value  Date   GLUCOSE 189 (H) 04/19/2022   NA 135 04/19/2022   K 3.4 (L) 04/19/2022   CL 96 04/19/2022   CO2 29 04/19/2022   BUN 9 04/19/2022   CREATININE 0.73 04/19/2022   GFRNONAA 69 07/10/2017   CALCIUM 9.5 04/19/2022   PROT 7.6 04/08/2022   ALBUMIN 4.3 04/08/2022   LABGLOB 3.0 07/10/2017   AGRATIO 1.5 07/10/2017   BILITOT 0.5 04/08/2022   ALKPHOS 53 04/08/2022   AST 20 04/08/2022   ALT 15 04/08/2022   ANIONGAP 7 11/22/2014      Lab Results  Component Value Date   ALT 15 04/08/2022   AST 20 04/08/2022   ALKPHOS 53 04/08/2022   BILITOT 0.5 04/08/2022       Latest Ref Rng & Units 04/08/2022    2:06 PM 01/23/2018    3:07 PM 07/10/2017    2:57 PM  Hepatic Function  Total Protein 6.0 - 8.3 g/dL 7.6  7.2  7.5   Albumin 3.5 - 5.2 g/dL 4.3   4.5   AST 0 - 37 U/L 20  21  32   ALT 0 - 35 U/L '15  12  16   '$ Alk Phosphatase 39 - 117 U/L 53   75   Total Bilirubin 0.2 - 1.2 mg/dL 0.5  0.4  0.3    HEMOGLOBIN A1C Lab Results  Component Value Date   HGBA1C 6.2 (H) 01/23/2018   MPG 131 01/23/2018   ProBNP (last 3 results) Recent Labs    04/19/22 1404  PROBNP 169    External labs:   NA  Radiology:   High-resolution CT 09/29/2020: 1. 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 2. A few scattered nodular foci of peripheral peri-bronchovascular consolidation, largest 1.2 cm in the posterior right lower lobe, probably infectious/inflammatory  such as due to MAI. Suggest  attention on follow-up  noncontrast chest CT in 3-6 months. 3. Scattered mild air trapping in both lungs, indicative of small airways disease. 4. Three-vessel coronary atherosclerosis. 5. Diffuse hepatic steatosis. 6. Aortic Atherosclerosis  CXR 04/10/2022: Cardiac size mediastinal contours are within normal limits.  Mild-to-moderate calcification within the aortic arch. Moderate chronic bilateral interstitial thickening, not significantly changed from multiple prior radiographs.   Cardiac Studies:   Carotid artery duplex May 15, 2022: Duplex suggests stenosis in the right internal carotid artery (1-15%). Duplex suggests stenosis in the left internal carotid artery (1-15%). There is mild heterogenous plaque noted in the bilateral carotid arteries. Antegrade right vertebral artery flow. Antegrade left vertebral artery flow.  Echocardiogram 15-May-2022:  Left ventricle cavity is normal in size. Moderate concentric hypertrophy of the left ventricle. Normal global wall motion. Normal diastolic filling pattern. Normal LV systolic function with EF 66%. Calculated EF 66%. Trileaflet aortic valve with no regurgitation. Mild calcification of the aortic valve annulus. Moderate aortic valve leaflet calcification. Mildly restricted aortic valve leaflets. Mild aortic valve stenosis. Peak velocity  2.63ms, Peak Pressure Gradient  27.2 mmHg, Mean Gradient 170mg, AVA 1.3cm, Dimensionless Index 0.4 Structurally normal tricuspid valve with trace regurgitation. No evidence of pulmonary hypertension.  Carotid artery duplex 01February 25, 2024Duplex suggests stenosis in the right internal carotid artery (1-15%). Duplex suggests stenosis in the left internal carotid artery (1-15%). There is mild heterogenous plaque noted in the bilateral carotid arteries. Antegrade right vertebral artery flow. Antegrade left vertebral artery  flow.  Nocturnal oximetry 05/09/2022: SpO2 <88% 6 minutes SpO2 less than 89% 9 hours and 1 minute.  Highest SpO2  99, lowest SpO2 74.  Oxygen desaturation events 3% (119.  Oxygen desaturation index 34.  Patient appears to qualify for nocturnal oxygen supplementation Group 1 Medicare guidelines.  PCV MYOCARDIAL PERFUSION WITH LEXISCAN 05/23/2022  Narrative Regadenoson Nuclear stress test 05/23/2022: Myocardial perfusion is normal. Overall LV systolic function is normal without regional wall motion abnormalities. Stress LV EF: 58%. Nondiagnostic ECG stress. The heart rate response was consistent with Regadenoson. No previous exam available for comparison. Low risk.  EKG:   EKG 04/13/2022: Normal sinus rhythm at rate of 75 bpm, left atrial enlargement, left axis deviation, left anterior fascicular block.  Right bundle branch block.  Bifascicular block. LVH.  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    Assessment     ICD-10-CM   1. Shortness of breath  R06.02     2. Primary hypertension  I10     3. Mild aortic stenosis  I35.0     4. Bronchiectasis without complication ([place])  A999333     5. Nocturnal hypoxemia  G47.34       No orders of the defined types were placed in this encounter.   No orders of the defined types were placed in this encounter.   There are no discontinued medications.    Recommendations:   Paige Yamasaki is a 82 y.o.  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  1. Shortness of breath I reviewed the results of the echocardiogram nuclear stress test, recently performed chest x-ray with the patient and her daughter who is a Designer, jewellery.  I suspect her dyspnea is mostly  related to combination of underlying bronchiectasis which chest x-ray reveals significant amount of scarring and on auscultation she has significant amount of rhonchi bilaterally. Uncontrolled hypertension which is now better controlled since increasing the dose of the losartan, obesity, underlying mild aortic stenosis and diastolic dysfunction are all contributing to her dyspnea.  Do not suspect acute decompensated heart failure in the absence of leg edema, JVD and also BNP was within normal limits.  Extensive discussion with the patient that in view of underlying lung disease, she really needs to work hard on losing weight, regular exercise was stressed to the patient as well.  2. Primary hypertension Blood pressure is much improved since losartan dose was increased.  Will continue present medications for now.  3. Mild aortic stenosis Echocardiogram revealed very mild aortic stenosis.  No change in therapy is indicated.  4. Bronchiectasis without complication ([place]) As dictated above, or shortness of breath is probably related to underlying bronchiectasis.  Wonder if this is progressed.  She also has severe nocturnal hypoxemia and mild OSA with significant desaturation of oxygen at night.  She has an appointment coming up with Dr. Baird Lyons, I will certainly forward my office visit note to her.  Patient high-resolution CT did not suggest IPF.  5. Nocturnal hypoxemia As dictated above, she may benefit from nocturnal oxygen supplementation.   Adrian Prows, MD, [facility] 06/01/2022, 5:59 PM Office: [phone]

## 2022-06-02 ENCOUNTER — Telehealth: Payer: Self-pay | Admitting: Internal Medicine

## 2022-06-02 ENCOUNTER — Ambulatory Visit: Payer: Medicare PPO | Admitting: Internal Medicine

## 2022-06-02 ENCOUNTER — Other Ambulatory Visit: Payer: Self-pay

## 2022-06-02 ENCOUNTER — Encounter: Payer: Self-pay | Admitting: Internal Medicine

## 2022-06-02 VITALS — BP 144/87 | HR 61 | Temp 98.0°F | Wt 192.0 lb

## 2022-06-02 DIAGNOSIS — A318 Other mycobacterial infections: Secondary | ICD-10-CM

## 2022-06-02 DIAGNOSIS — G4734 Idiopathic sleep related nonobstructive alveolar hypoventilation: Secondary | ICD-10-CM

## 2022-06-02 DIAGNOSIS — J849 Interstitial pulmonary disease, unspecified: Secondary | ICD-10-CM

## 2022-06-02 NOTE — Assessment & Plan Note (Signed)
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  Nonetheless, her daughter and granddaughter both stated quite firmly that they had all decided that she would not undergo with the complicated and risky intravenous antibiotic regimen that would be required to try to treat symptomatic infection.  She is going to have a repeat chest CT soon which I will review.  She can follow-up here as needed in the future if there is evidence of new infection that they would like to consider treating.

## 2022-06-02 NOTE — Progress Notes (Signed)
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    Priority: High   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    Priority: High   COVID-19 09/16/2020   Asthmatic bronchitis 09/08/2020   OSA (obstructive sleep apnea) 09/08/2020   DVT (deep venous thrombosis) ([place]) 02/12/2019   Breast mass, left 07/10/2017   HTN (hypertension), benign 07/10/2017   Peripheral neuropathy 03/08/2016   Borderline diabetes    Rheumatoid arthritis ([place]) 10/29/2015   DJD (degenerative joint disease) 10/29/2015   Osteoporosis 10/29/2015   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   ALBUTEROL (VENTOLIN HFA) 108 (90 BASE) MCG/ACT INHALER    Inhale 2 puffs into the lungs every 4 (four) hours as needed for wheezing or shortness of breath.   ASPIRIN EC 81 MG TABLET    Take 81 mg by mouth daily.   CALCIUM CITRATE-VITAMIN D (CALCIUM CITRATE+D3 PETITES) 200-250 MG-UNIT TABS    Take by mouth.   CELECOXIB (CELEBREX) 100 MG CAPSULE    Take 1 capsule (100 mg total) by mouth 2 (two) times daily.   CETIRIZINE (ZYRTEC) 10 MG TABLET    Take 10 mg by mouth at bedtime.   CHLORTHALIDONE (HYGROTON) 25 MG TABLET    Take 25 mg by mouth daily.   DORZOLAMIDE HCL-TIMOLOL MAL PF 2-0.5 % SOLN    Place 1 drop into affected eye 2 (two) times daily.   FLUTICASONE (FLONASE) 50 MCG/ACT NASAL SPRAY    1 spray by Each Nare route daily.   FLUTICASONE FUROATE-VILANTEROL (BREO ELLIPTA) 200-25 MCG/ACT AEPB    Inhale 1 puff then rinse mouth, daily   LABETALOL  (NORMODYNE) 100 MG TABLET    Take 100 mg by mouth 2 (two) times daily.   LATANOPROST (XALATAN) 0.005 % OPHTHALMIC SOLUTION    Place 1 drop into both eyes at bedtime.   LOSARTAN (COZAAR) 50 MG TABLET    Take 1 tablet (50 mg total) by mouth every evening.   MULTIPLE VITAMIN (MULTIVITAMIN) TABLET    Take 1 tablet by mouth daily.   OMEPRAZOLE (PRILOSEC) 20 MG CAPSULE    Take by mouth daily.   POTASSIUM CHLORIDE (KLOR-CON) 20 MEQ PACKET    Take 20 mEq by mouth daily.   PREDNISONE (DELTASONE) 5 MG TABLET    Take 5 mg by mouth daily.   PREGABALIN (LYRICA) 25 MG CAPSULE    Take 25 mg by mouth daily.   REFRESH LIQUIGEL 1 % GEL    See admin instructions.   ROSUVASTATIN (CRESTOR) 20 MG TABLET    Take 20 mg by mouth daily.  Modified Medications   No medications on file  Discontinued Medications   No medications on file    Subjective: Teresa French is in for her routine follow-up visit.  She is accompanied by her daughter and her granddaughter.  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  A follow-up AFB culture in November 2019  grew Mycobacterium porcinum, which was felt to be an insignificant colonizer.  She had some worsening respiratory symptoms in 2022.  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  On serial evaluation I did not feel that her condition warranted the very difficult and risky antibiotic therapy required to try to control it.  She recently developed another round of breakthrough COVID infection in January.  A chest x-ray in late January did not show any acute changes.  She is generally feeling better.  There has been little change in her chronic cough that is occasionally productive of some yellow sputum.  She has not had no hemoptysis.  She has chronic dyspnea on exertion which she feels may be slightly worse over the past few  months.  She has not had any fever, chills or sweats.  Her appetite remains very good.  Her most recent AFB culture in January of this year grew Mycobacterium abscessus again.  Review of Systems: Review of Systems  Constitutional:  Negative for chills, diaphoresis, fever and weight loss.  Respiratory:  Positive for cough, sputum production and shortness of breath. Negative for hemoptysis and wheezing.   Cardiovascular:  Positive for chest pain.       She has some occasional nonpleuritic pain along her left lower ribs.  She notes that when she bends over.    Past Medical History:  Diagnosis Date   Arthritis    Asthma    Borderline diabetes    Breast mass, left 07/10/2017   06/19/17: mammos: lobulated mass sup & lat to nipple; [place]: 1.4x0.5x1.0 cm  Bi-RADS4   Complication of anesthesia    "I SLEEP A VERY VERY LONG TIME"   Eczema    Environmental allergies    Glaucoma    History of transfusion    HTN (hypertension), benign 07/10/2017   Hyperlipidemia    Hypertension    Memory loss    Tingling    OF FEET    Social History   Tobacco Use   Smoking status: Never   Smokeless tobacco: Never  Vaping Use   Vaping Use: Never used  Substance Use Topics   Alcohol use: No   Drug use: No    Family History  Problem Relation Age of Onset   Heart disease Mother    Lung cancer Father    Breast cancer Paternal Aunt        27s   Breast cancer Cousin 25   Breast cancer Cousin 40    No Known Allergies  Objective: Vitals:   06/02/22 1131 06/02/22 1201  BP: (!) 164/77 (!) 144/87  Pulse: 61   Temp: 98 F (36.7 C)   TempSrc: Oral   SpO2: 96%   Weight: 192 lb (87.1 kg)    Body mass index is 31.95 kg/m.  Physical Exam Constitutional:      Comments: She is calm and pleasant as usual.  Cardiovascular:     Rate and Rhythm: Normal rate and regular rhythm.     Heart sounds: Murmur heard.     Comments: She has an early 2/6 systolic murmur. Pulmonary:     Effort: Pulmonary effort  is normal.     Breath sounds: Rales present. No wheezing or rhonchi.     Comments: She has some bibasilar rales posteriorly. Psychiatric:        Mood and Affect: Mood normal.         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  Nonetheless, her daughter and granddaughter both stated quite firmly that they had all decided that she would not undergo with the complicated and risky intravenous antibiotic regimen that would be required to try to treat symptomatic infection.  She is going to have a repeat chest CT soon which I will review.  She can follow-up here as needed in the future if there is evidence of new infection that they would like to consider treating.        Michel Bickers, MD [facility] for Infectious [facility] [phone] pager   [phone] cell 06/02/2022, 12:12 PM

## 2022-06-02 NOTE — Telephone Encounter (Signed)
Daughter [name], NP called to discuss mother's recent cardiology visit with Dr Einar Gip. He suggests O2 for sleep, based on recent qualifying overnight oximetry done 05/09/22 and recorded in Epic as procedure dated 05/24/22. Sustained desaturation 89% and below for 9 hours.  Order- home oxygen for sleep 2L     dx nocturnal hypoxemia.

## 2022-06-02 NOTE — Telephone Encounter (Signed)
Spoke with patients daughter. Order for 02 has been placed. Nothing further needed.

## 2022-06-02 NOTE — Telephone Encounter (Signed)
Spoke with patients daughter. Order for ct has been placed. Nothing further needed.

## 2022-06-07 ENCOUNTER — Telehealth: Payer: Self-pay

## 2022-06-07 NOTE — Telephone Encounter (Signed)
Based on instructions from DME [employer], to qualify Teresa French for home oxygen, we need to show that she desaturates during a walk test, or we need to do an in-center split night study that shows she has OSA, that she needss CPAP, and that she needs O2 in addition to CPAP.  I suggest we order O2 qualifying walk test       dx

## 2022-06-07 NOTE — Telephone Encounter (Addendum)
Dr. Annamaria Boots please advise. Received the message below from Ashly at [facility]   Her Insurance will not approve nighttime O2 for her Noct. Hypoxemia.  She had been Dx with OSA back in May of 2023.  The only way to get approval for Teresa French to get O2 is a 6 min O2 qualification walk test. Or have her complete a CPAP titration that would demonstrate the need for supplemental O2 while OSA had been resolved with PAP therapy.  Order- O2 qualifying walk test

## 2022-06-08 NOTE — Telephone Encounter (Signed)
Pt calling back for the recommendations of Dr. Annamaria Boots

## 2022-06-08 NOTE — Telephone Encounter (Signed)
Spoke to pt and informed her of Dr Janee Morn recommendations for oxygen. Pt verbalized understanding nothing further needed.

## 2022-06-08 NOTE — Telephone Encounter (Signed)
Called and left voicemail for her to call office back for recommendations from Dr Annamaria Boots about her oxygen

## 2022-06-15 ENCOUNTER — Other Ambulatory Visit: Payer: Self-pay | Admitting: Internal Medicine

## 2022-06-20 ENCOUNTER — Ambulatory Visit (HOSPITAL_BASED_OUTPATIENT_CLINIC_OR_DEPARTMENT_OTHER): Payer: Medicare PPO

## 2022-06-21 ENCOUNTER — Ambulatory Visit: Payer: Medicare PPO | Admitting: Internal Medicine

## 2022-07-06 ENCOUNTER — Ambulatory Visit (HOSPITAL_BASED_OUTPATIENT_CLINIC_OR_DEPARTMENT_OTHER): Payer: Medicare PPO

## 2022-07-07 ENCOUNTER — Telehealth: Payer: Self-pay | Admitting: Internal Medicine

## 2022-07-07 NOTE — Telephone Encounter (Signed)
We will have to have the provider look at the ONO results to tell us if the amount of time that was recorded is sufficient enough for results. Nothing further needed

## 2022-07-07 NOTE — Telephone Encounter (Signed)
PT made FU appt. The ONO test she took seemed to stop at 1:00 AM will it still be able to be read? Thanks.

## 2022-07-11 ENCOUNTER — Ambulatory Visit: Payer: Medicare PPO | Admitting: Internal Medicine

## 2022-07-11 ENCOUNTER — Ambulatory Visit (HOSPITAL_BASED_OUTPATIENT_CLINIC_OR_DEPARTMENT_OTHER)
Admission: RE | Admit: 2022-07-11 | Discharge: 2022-07-11 | Disposition: A | Discharge status: Home / Self Care | Payer: Medicare PPO | Source: Ambulatory Visit | Attending: Internal Medicine | Admitting: Internal Medicine

## 2022-07-11 DIAGNOSIS — J849 Interstitial pulmonary disease, unspecified: Secondary | ICD-10-CM | POA: Diagnosis not present

## 2022-07-20 ENCOUNTER — Ambulatory Visit: Payer: Medicare PPO | Admitting: Nurse Practitioner

## 2022-07-21 ENCOUNTER — Ambulatory Visit: Payer: Medicare PPO | Admitting: Nurse Practitioner

## 2022-07-25 ENCOUNTER — Telehealth: Payer: Self-pay | Admitting: Internal Medicine

## 2022-07-25 DIAGNOSIS — G4733 Obstructive sleep apnea (adult) (pediatric): Secondary | ICD-10-CM

## 2022-07-25 NOTE — Telephone Encounter (Signed)
Order- Split night sleep study at [facility]     dx OSA  Contact person for details and scheduling is her daughter Gwinda Passe

## 2022-07-26 ENCOUNTER — Other Ambulatory Visit: Payer: Self-pay

## 2022-07-26 DIAGNOSIS — G4733 Obstructive sleep apnea (adult) (pediatric): Secondary | ICD-10-CM

## 2022-07-26 NOTE — Telephone Encounter (Signed)
Elon Jester daughter is returning phone call. Elon Jester phone number is [phone] and [phone].

## 2022-07-26 NOTE — Telephone Encounter (Signed)
ATC X1 LVM for daughter. Please advise Dr. Maple Hudson wants patient to have in lab sleep study. The sleep center at [facility] will call her to get patient scheduled

## 2022-07-27 NOTE — Telephone Encounter (Signed)
Called and spoke with pt. Informed pt about the split night study order. Pt verbalized understanding, nothing further needed at this time.

## 2022-08-18 ENCOUNTER — Ambulatory Visit: Payer: Medicare PPO | Admitting: Primary Care

## 2022-08-25 ENCOUNTER — Telehealth: Payer: Self-pay | Admitting: Internal Medicine

## 2022-08-25 ENCOUNTER — Other Ambulatory Visit: Payer: Self-pay

## 2022-08-25 MED ORDER — FLUTICASONE FUROATE-VILANTEROL 200-25 MCG/ACT IN AEPB: INHALATION_SPRAY | RESPIRATORY_TRACT | 12 refills | Status: DC

## 2022-08-25 MED ORDER — ALBUTEROL SULFATE HFA 108 (90 BASE) MCG/ACT IN AERS: 2.0000 | INHALATION_SPRAY | RESPIRATORY_TRACT | 12 refills | Status: DC | PRN

## 2022-08-25 NOTE — Telephone Encounter (Signed)
Spoke with patient. Refills for breo and albuterol have been sent to pharmacy. NFN 

## 2022-08-25 NOTE — Telephone Encounter (Signed)
Pt needs a refill for Breo & Albuterol

## 2022-08-25 NOTE — Telephone Encounter (Signed)
Spoke with patient. Refills for breo and albuterol have been sent to pharmacy. NFN

## 2022-09-04 ENCOUNTER — Ambulatory Visit (HOSPITAL_BASED_OUTPATIENT_CLINIC_OR_DEPARTMENT_OTHER): Payer: Medicare PPO | Attending: Internal Medicine | Admitting: Internal Medicine

## 2022-09-04 VITALS — Ht 65.0 in | Wt 182.0 lb

## 2022-09-04 DIAGNOSIS — G4733 Obstructive sleep apnea (adult) (pediatric): Secondary | ICD-10-CM

## 2022-09-10 DIAGNOSIS — G4733 Obstructive sleep apnea (adult) (pediatric): Secondary | ICD-10-CM | POA: Diagnosis not present

## 2022-09-10 NOTE — Procedures (Signed)
Patient Name: Teresa French, Teresa French Date: 09/04/2022 Gender: Female D.O.B: [birth year] Age (years): 56 Referring Provider: Jetty Duhamel MD, ABSM Height (inches): 65 Interpreting Physician: Jetty Duhamel MD, ABSM Weight (lbs): 182 RPSGT: Rolene Arbour BMI: 30 MRN: [id number] Neck Size: 15.50  CLINICAL INFORMATION Sleep Study Type: Split Night CPAP Indication for sleep study: nsomnia with OSA Epworth Sleepiness Score: 12  SLEEP STUDY TECHNIQUE As per the AASM Manual for the Scoring of Sleep and Associated Events v2.3 (April 2016) with a hypopnea requiring 4% desaturations.  The channels recorded and monitored were frontal, central and occipital EEG, electrooculogram (EOG), submentalis EMG (chin), nasal and oral airflow, thoracic and abdominal wall motion, anterior tibialis EMG, snore microphone, electrocardiogram, and pulse oximetry. Continuous positive airway pressure (CPAP) was initiated when the patient met split night criteria and was titrated according to treat sleep-disordered breathing.  MEDICATIONS Medications self-administered by patient taken the night of the study : ASPIRIN, CELEBREX, CRESTOR, DORZOLAMIDE, LATANOPROST  RESPIRATORY PARAMETERS Diagnostic  Total AHI (/hr): 16.0 RDI (/hr): 19.9 OA Index (/hr): 4.9 CA Index (/hr): 0.0 REM AHI (/hr): 48.5 NREM AHI (/hr): 8.4 Supine AHI (/hr): 9.4 Non-supine AHI (/hr): 19.1 Min O2 Sat (%): 69.0 Mean O2 (%): 94.1 Time below 88% (min): 9.1   Titration  Optimal Pressure (cm):  AHI at Optimal Pressure (/hr): N/A Min O2 at Optimal Pressure (%): 81.0 Supine % at Optimal (%): N/A Sleep % at Optimal (%): N/A   SLEEP ARCHITECTURE The recording time for the entire night was 375.2 minutes.  During a baseline period of 192.7 minutes, the patient slept for 123.5 minutes in REM and nonREM, yielding a sleep efficiency of 64.1%. Sleep onset after lights out was 8.8 minutes with a REM latency of 116.0 minutes. The patient spent  2.4% of the night in stage N1 sleep, 78.5% in stage N2 sleep, 0.0% in stage N3 and 19% in REM.  During the titration period of 166.2 minutes, the patient slept for 126.9 minutes in REM and nonREM, yielding a sleep efficiency of 76.3%. Sleep onset after CPAP initiation was 13.4 minutes with a REM latency of 50.0 minutes. The patient spent 0.8% of the night in stage N1 sleep, 68.5% in stage N2 sleep, 0.0% in stage N3 and 30.7% in REM.  CARDIAC DATA The 2 lead EKG demonstrated sinus rhythm. The mean heart rate was 73.4 beats per minute. Other EKG findings include: PVCs.  LEG MOVEMENT DATA The total Periodic Limb Movements of Sleep (PLMS) were 0. The PLMS index was 0.0 .  IMPRESSIONS - Moderate obstructive sleep apnea occurred during the diagnostic portion of the study(AHI = 16.0/hour).  - CPAP titrated to 13 cwp, AHI 0.0.     - Moderate oxygen desaturation was noted during the diagnostic portion of the study (Min O2 =69.0%). Minimum O2 saturation on CPAP 13 was 93%. - The patient snored with soft snoring volume during the diagnostic portion of the study. - Frequent PVCs were noted during this study. - Clinically significant periodic limb movements did not occur during sleep. - Patient needed assistance to move and to get up, and had pain treated with Celebrex.  DIAGNOSIS - Obstructive Sleep Apnea (G47.33)  RECOMMENDATIONS - Suggest CPAP titration sleep study or autopap 5-15.  - Patient wore a small FF Simplus mask with heated humidifier. - Be careful with alcohol, sedatives and other CNS depressants that may worsen sleep apnea and disrupt normal sleep architecture. - Sleep hygiene should be reviewed to assess factors that may improve  sleep quality. - Weight management and regular exercise should be initiated or continued.  [Electronically signed] 09/10/2022 10:43 AM  Jetty Duhamel MD, ABSM Diplomate, American Board of Sleep Medicine NPI: [id number]                         Jetty Duhamel Diplomate, American Board of Sleep Medicine  ELECTRONICALLY SIGNED ON:  09/10/2022, 10:26 AM [facility] PH: [phone]   FX: [phone] ACCREDITED BY THE AMERICAN ACADEMY OF SLEEP MEDICINE

## 2022-09-12 ENCOUNTER — Other Ambulatory Visit: Payer: Self-pay

## 2022-09-12 ENCOUNTER — Telehealth: Payer: Self-pay | Admitting: Internal Medicine

## 2022-09-12 DIAGNOSIS — G4733 Obstructive sleep apnea (adult) (pediatric): Secondary | ICD-10-CM

## 2022-09-12 NOTE — Telephone Encounter (Signed)
Order- new DME, new CPAP auto 5-15, mask of choice, humidifier, supplies, AirView/card NOTE- She is living with her daughter Gwinda Passe who is the contact person for all communication. Service address [address] [place], [place] [address] Daughter's phone (M)  [phone]

## 2022-09-13 NOTE — Telephone Encounter (Signed)
Error

## 2022-09-14 ENCOUNTER — Other Ambulatory Visit (HOSPITAL_COMMUNITY): Payer: Self-pay

## 2022-09-14 MED ORDER — CHLORTHALIDONE 25 MG PO TABS
25.0000 mg | ORAL_TABLET | Freq: Two times a day (BID) | ORAL | 0 refills | Status: DC
  Filled 2022-09-14: qty 60, 30d supply, fill #0

## 2022-09-16 NOTE — Telephone Encounter (Signed)
Order placed 09/12/22 by Sheppard Penton.  Nothing further at this time.

## 2022-12-14 ENCOUNTER — Telehealth: Payer: Self-pay | Admitting: Internal Medicine

## 2022-12-14 NOTE — Telephone Encounter (Signed)
Called and spoke with patient daughter Marcelino Duster regarding patient office visit . Per Dr.Young ok to use Held spot . Made office visit for 12/29/2022 at 4:30 .[name]'s voice was understanding.Nothing else further needed.

## 2022-12-14 NOTE — Telephone Encounter (Signed)
Daughter Teresa French has called. Teresa French needs CPAP f/u appointment with me while she is here in town, staying with daughter.  Please contact daughter Teresa French at [phone] To schedule mother's appt with me in first week or 2 of October. OK to use a held-spot. Contact me about it if there is a problem.

## 2022-12-29 ENCOUNTER — Encounter: Payer: Self-pay | Admitting: Internal Medicine

## 2022-12-29 ENCOUNTER — Ambulatory Visit: Payer: Medicare PPO | Admitting: Internal Medicine

## 2022-12-29 VITALS — BP 138/69 | HR 82 | Ht 64.0 in | Wt 178.2 lb

## 2022-12-29 DIAGNOSIS — J189 Pneumonia, unspecified organism: Secondary | ICD-10-CM

## 2022-12-29 DIAGNOSIS — G4733 Obstructive sleep apnea (adult) (pediatric): Secondary | ICD-10-CM | POA: Diagnosis not present

## 2022-12-29 DIAGNOSIS — Z23 Encounter for immunization: Secondary | ICD-10-CM

## 2022-12-29 DIAGNOSIS — J479 Bronchiectasis, uncomplicated: Secondary | ICD-10-CM | POA: Diagnosis not present

## 2022-12-29 MED ORDER — FLUTICASONE FUROATE-VILANTEROL 200-25 MCG/ACT IN AEPB: INHALATION_SPRAY | RESPIRATORY_TRACT | 4 refills | Status: DC

## 2022-12-29 MED ORDER — ALBUTEROL SULFATE HFA 108 (90 BASE) MCG/ACT IN AERS: 2.0000 | INHALATION_SPRAY | RESPIRATORY_TRACT | 4 refills | Status: DC | PRN

## 2022-12-29 MED ORDER — ALBUTEROL SULFATE (2.5 MG/3ML) 0.083% IN NEBU: 2.5000 mg | INHALATION_SOLUTION | Freq: Two times a day (BID) | RESPIRATORY_TRACT | 12 refills | Status: DC | PRN

## 2022-12-29 NOTE — Patient Instructions (Addendum)
Refills sent to [facility] for Albuterol hfa, neb solution and Breo  Refills sent to [facility] on [facility] for Albuterol hfa and [employer] are doing great wiwth CPAP- keep up the good work!  I am hearing a heart murmur. You can ask Dr Jacinto Halim about it.  Order- Flu vax senior  Order- schedule HRCT chest no contrast future- for Feburary   dx atypical pneumonia  Order- referral to Dr Aram Beecham Snider/ Infectious Disease- for March, 2025

## 2022-12-29 NOTE — Progress Notes (Signed)
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 HST 11/01/20- AHI11.4/ hr, desaturation to 70%, body weight 188 lbs  ===========================================================  04/19/22- 81 yoF never smoker, mother of Gwinda Passe, NP, followed for Bronchiectasis/ MAIC/ M abscessus), OSA/ OAP, complicated by  HTN, DVT, CAD, Aortic Atherosclerosis, CHF,  Peripheral Neuropathy, Rheumatoid Arthritis , Osteoporosis, Hyperlipidemia, Glaucoma, L Breast Mass, Environmental Allergies, Memory Loss, Covid Infection Jan 2022,                                           - Ventolin hfa,  Neb albuterol, Flonase, prednisone 5 mg daily,        note Timoptic Body weight today- Covid vax-4 Phizer Flu vax-had Never received CPAP machine. [facility] has no record. After discussion with daughter today, we are going to check overnight oximetry and wait on CPAP decision. She was fortunate to get evaluation for cardiac status by Dr. Jacinto Halim.  His note is reviewed.  He asked for some labs to help as he evaluates cardiac status. She and her family had COVID infection last month.  She is largely back to baseline now. Dr. 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  Symptoms are minimal so we will follow conservatively.  Dr. Orvan Falconer is retiring in April. CXR 04/10/22- MPRESSION: Moderate chronic bilateral interstitial thickening, not significantly changed from multiple prior radiographs. No acute airspace opacity.  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 Currently staying with daughter. CPAP auto 5-15/ [facility]   new 09/13/22 Download compliance- 97%, AHI 1.2/hr Download reviewed. She is satisfied with her breathing. Admits some cough about 1x day, occ night sweat. We will establish with new ID to replace Dr Orvan Falconer, but expect to continue monitoring by CT. Flu vax today- senior. HRCT chest 07/11/22- IMPRESSION: 1. 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 Mycobacterium avium complex is another consideration. Findings are suggestive of an alternative diagnosis (not UIP) per consensus guidelines: Diagnosis of Idiopathic Pulmonary Fibrosis: An Official ATS/ERS/JRS/ALAT Clinical Practice Guideline. Am Rosezetta Schlatter Crit Care Med Vol 198, Iss 5, [phone], Nov 19 2016. 2. A few new scattered pulmonary nodules measure up to 5 mm in  the right lower lobe and are likely inflammatory given findings above. CT chest without contrast could be obtained in 3-6 months in further evaluation, as clinically indicated. 3. Aortic atherosclerosis (ICD10-I70.0). Coronary artery calcification.   ROS-see HPI   + = positive Constitutional:    weight loss, night sweats, fevers, chills, +fatigue, lassitude. HEENT:    headaches, difficulty swallowing, tooth/dental problems, sore throat,       +sneezing, itching, ear  ache, +nasal congestion, post nasal drip, snoring CV:    chest pain, orthopnea, PND, +swelling in lower extremities, anasarca,                                   dizziness, palpitations Resp:   +shortness of breath with exertion or at rest.                +productive cough,   non-productive cough, coughing up of blood.              +change in color of mucus.  wheezing.   Skin:    +rash or lesions. GI:  No-   heartburn, indigestion, abdominal pain, nausea, vomiting, diarrhea,                 change in bowel habits, loss of appetite GU: dysuria, change in color of urine, no urgency or frequency.   flank pain. MS:   joint pain, stiffness, decreased range of motion, back pain. Neuro-     nothing unusual Psych:  change in mood or affect.  depression or anxiety.   memory loss.  OBJ- Physical Exam General- Alert, Oriented, Affect-appropriate, Distress- none acute, + obese Skin- rash-none, lesions- none, excoriation- none Lymphadenopathy- none Head- atraumatic            Eyes- Gross vision intact, PERRLA, conjunctivae and secretions clear            Ears- Hearing, canals-normal            Nose- Clear, no-Septal dev, mucus, polyps, erosion, perforation             Throat- Mallampati IV , mucosa clear , drainage- none, tonsils- atrophic, +teeth Neck- flexible , trachea midline, no stridor , thyroid nl, carotid no bruit Chest - symmetrical excursion , unlabored           Heart/CV- RRR , no murmur , no gallop  , no rub, nl s1 s2                           - JVD- none , edema- none, stasis changes- none, varices- none           Lung- +few faint crackles, wheeze- none, cough- none , dullness-none, rub- none           Chest wall-  Abd-  Br/ Gen/ Rectal- Not done, not indicated Extrem- cyanosis- none, clubbing, none, atrophy- none, strength- nl Neuro- grossly intact to observation

## 2023-01-05 ENCOUNTER — Ambulatory Visit: Payer: Medicare PPO | Admitting: Infectious Diseases

## 2023-01-21 ENCOUNTER — Encounter: Payer: Self-pay | Admitting: Internal Medicine

## 2023-01-21 NOTE — Assessment & Plan Note (Signed)
Benefits now from CPAP with good compliance eand control Plan- continue auto 5-15

## 2023-01-21 NOTE — Assessment & Plan Note (Signed)
Plan- with new ID since Dr Orvan Falconer retired. Update CT

## 2023-02-14 ENCOUNTER — Ambulatory Visit: Payer: Medicare PPO | Admitting: Internal Medicine

## 2023-04-28 ENCOUNTER — Ambulatory Visit
Admission: RE | Admit: 2023-04-28 | Discharge: 2023-04-28 | Disposition: A | Discharge status: Home / Self Care | Payer: Medicare PPO | Source: Ambulatory Visit | Attending: Internal Medicine | Admitting: Internal Medicine

## 2023-04-28 DIAGNOSIS — J189 Pneumonia, unspecified organism: Secondary | ICD-10-CM

## 2023-05-05 ENCOUNTER — Other Ambulatory Visit: Payer: Self-pay | Admitting: Internal Medicine

## 2023-05-05 DIAGNOSIS — Z1231 Encounter for screening mammogram for malignant neoplasm of breast: Secondary | ICD-10-CM

## 2023-05-15 ENCOUNTER — Telehealth: Payer: Self-pay | Admitting: Internal Medicine

## 2023-05-15 DIAGNOSIS — R918 Other nonspecific abnormal finding of lung field: Secondary | ICD-10-CM

## 2023-05-15 NOTE — Telephone Encounter (Signed)
 Ordering f/u HRCT chest for cavitary nodules

## 2023-06-01 ENCOUNTER — Telehealth: Payer: Self-pay | Admitting: *Deleted

## 2023-06-01 NOTE — Telephone Encounter (Signed)
 Received fax 05/25/2023 from [employer] for patients daughter.   Forms placed in provider box for completion.   Patient has an appointment scheduled 06/06/2023 to see provider.

## 2023-06-04 NOTE — Progress Notes (Signed)
 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 Currently staying with daughter. CPAP auto 5-15/ [employer]   new 09/13/22 Download compliance- 97%, AHI 1.2/hr Download reviewed. She is satisfied with her breathing. Admits some cough about 1x day, occ night sweat. We will establish with new ID to replace Dr Orvan Falconer, but expect to continue monitoring by CT. Flu vax today- senior. HRCT chest 07/11/22- IMPRESSION: 1. 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 Mycobacterium avium complex is another consideration. Findings are suggestive of an alternative diagnosis (not UIP) per consensus guidelines: Diagnosis of Idiopathic Pulmonary  Fibrosis: An Official ATS/ERS/JRS/ALAT Clinical Practice Guideline. Am Rosezetta Schlatter Crit Care Med Vol 198, Iss 5, [phone], Nov 19 2016. 2. A few new scattered pulmonary nodules measure up to 5 mm in the right lower lobe and are likely inflammatory given findings above. CT chest without contrast could be obtained in 3-6 months in further evaluation, as clinically indicated. 3. Aortic atherosclerosis (ICD10-I70.0). Coronary artery calcification.  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 Body weight today-178 lbs                       Currently staying with daughter.        Note diabetic range glucose at last BMET CPAP auto 5-15/ [employer]   new 09/13/22 Download compliance- 100%,  AHI 0.9/hr Download reviewed.Doing well with CPAP.                                                                                        Pending updated HRCT June 5. 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 Coughs some occasional white mucus. No real change.. Mentions occasional sterna pain, especially when sitting up. Possibly reflux. Not exertional, nonradiating. She will try Tums.   We will refill inhalers and update labs. CT chest 04/28/23 IMPRESSION: 1. 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 Findings are suggestive of an alternative diagnosis (not UIP) per consensus guidelines: Diagnosis of Idiopathic Pulmonary Fibrosis: An Official ATS/ERS/JRS/ALAT Clinical Practice Guideline. Am Rosezetta Schlatter Crit Care Med Vol 198, Iss 5, [phone], Nov 19 2016. 2. New or evolving cavitary nodules may be due to a superimposed atypical or fungal infectious process. Difficult to exclude septic emboli. 3. Additional scattered small pulmonary nodules are stable. Recommend attention on follow-up. 4. Aortic atherosclerosis (ICD10-I70.0). Coronary artery calcification.   ROS-see HPI   + = positive Constitutional:    weight loss, night sweats, fevers, chills, +fatigue, lassitude. HEENT:    headaches, difficulty swallowing, tooth/dental problems, sore throat,       +sneezing, itching, ear ache, +nasal congestion, post nasal drip, snoring CV:    chest pain, orthopnea, PND, +swelling in lower extremities, anasarca,                                   dizziness, palpitations Resp:   +shortness of breath with exertion or at rest.                +productive cough,   non-productive cough, coughing up of blood.              +change in color of mucus.  wheezing.   Skin:    +rash or lesions. GI:  No-   heartburn, indigestion, abdominal pain, nausea, vomiting, diarrhea,                 change in bowel habits, loss of appetite GU: dysuria, change in color of urine, no urgency or frequency.   flank pain. MS:   joint pain, stiffness, decreased range of motion, back pain. Neuro-     nothing unusual Psych:  change in mood or affect.  depression or anxiety.   memory loss.  OBJ- Physical Exam General- Alert, Oriented, Affect-appropriate, Distress- none acute, + obese Skin- rash-none, lesions- none, excoriation- none Lymphadenopathy- none Head- atraumatic            Eyes- Gross vision intact, PERRLA, conjunctivae and secretions clear            Ears-  Hearing, canals-normal            Nose- Clear, no-Septal dev, mucus, polyps, erosion, perforation             Throat- Mallampati IV , mucosa clear , drainage- none, tonsils- atrophic, +teeth Neck- flexible , trachea  midline, no stridor , thyroid nl, carotid no bruit Chest - symmetrical excursion , unlabored           Heart/CV- RRR , no murmur , no gallop  , no rub, nl s1 s2                           - JVD- none , edema- none, stasis changes- none, varices- none           Lung- +few faint crackles, wheeze- none, cough- none , dullness-none, rub- none           Chest wall-  Abd-  Br/ Gen/ Rectal- Not done, not indicated Extrem- cyanosis- none, clubbing, none, atrophy- none, strength- nl Neuro- grossly intact to observation

## 2023-06-06 ENCOUNTER — Ambulatory Visit: Payer: Medicare PPO | Admitting: Internal Medicine

## 2023-06-06 ENCOUNTER — Encounter: Payer: Self-pay | Admitting: Internal Medicine

## 2023-06-06 VITALS — BP 152/76 | HR 80 | Temp 98.3°F | Ht 65.0 in | Wt 174.0 lb

## 2023-06-06 DIAGNOSIS — G4733 Obstructive sleep apnea (adult) (pediatric): Secondary | ICD-10-CM

## 2023-06-06 DIAGNOSIS — J479 Bronchiectasis, uncomplicated: Secondary | ICD-10-CM

## 2023-06-06 DIAGNOSIS — R918 Other nonspecific abnormal finding of lung field: Secondary | ICD-10-CM

## 2023-06-06 LAB — CBC WITH DIFFERENTIAL/PLATELET
Basophils Absolute: 0 10*3/uL (ref 0.0–0.1)
Basophils Relative: 0.6 % (ref 0.0–3.0)
Eosinophils Absolute: 0.1 10*3/uL (ref 0.0–0.7)
Eosinophils Relative: 1.5 % (ref 0.0–5.0)
HCT: 34 % — ABNORMAL LOW (ref 36.0–46.0)
Hemoglobin: 11.6 g/dL — ABNORMAL LOW (ref 12.0–15.0)
Lymphocytes Relative: 26.5 % (ref 12.0–46.0)
Lymphs Abs: 1.4 10*3/uL (ref 0.7–4.0)
MCHC: 34.2 g/dL (ref 30.0–36.0)
MCV: 85.9 fl (ref 78.0–100.0)
Monocytes Absolute: 0.4 10*3/uL (ref 0.1–1.0)
Monocytes Relative: 7.5 % (ref 3.0–12.0)
Neutro Abs: 3.3 10*3/uL (ref 1.4–7.7)
Neutrophils Relative %: 63.9 % (ref 43.0–77.0)
Platelets: 230 10*3/uL (ref 150.0–400.0)
RBC: 3.95 Mil/uL (ref 3.87–5.11)
RDW: 14.9 % (ref 11.5–15.5)
WBC: 5.2 10*3/uL (ref 4.0–10.5)

## 2023-06-06 LAB — BASIC METABOLIC PANEL
BUN: 11 mg/dL (ref 6–23)
CO2: 25 meq/L (ref 19–32)
Calcium: 9.4 mg/dL (ref 8.4–10.5)
Chloride: 99 meq/L (ref 96–112)
Creatinine, Ser: 0.69 mg/dL (ref 0.40–1.20)
GFR: 80.24 mL/min (ref >60.00)
Glucose, Bld: 111 mg/dL — ABNORMAL HIGH (ref 70–99)
Potassium: 3.4 meq/L — ABNORMAL LOW (ref 3.5–5.1)
Sodium: 135 meq/L (ref 135–145)

## 2023-06-06 LAB — BRAIN NATRIURETIC PEPTIDE: Pro B Natriuretic peptide (BNP): 155 pg/mL — ABNORMAL HIGH (ref 0.0–100.0)

## 2023-06-06 LAB — HEMOGLOBIN A1C: Hgb A1c MFr Bld: 6.8 % — ABNORMAL HIGH (ref 4.6–6.5)

## 2023-06-06 MED ORDER — FLUTICASONE FUROATE-VILANTEROL 200-25 MCG/ACT IN AEPB: INHALATION_SPRAY | RESPIRATORY_TRACT | 4 refills | Status: AC

## 2023-06-06 MED ORDER — ALBUTEROL SULFATE HFA 108 (90 BASE) MCG/ACT IN AERS: 2.0000 | INHALATION_SPRAY | RESPIRATORY_TRACT | 4 refills | Status: AC | PRN

## 2023-06-06 NOTE — Patient Instructions (Signed)
 Refill scripts sent for Breo and for albuterol rescue inhaler  Order- lab- CBC with diff, BMET, A1-C, BNP   dx Lung nodules

## 2023-06-08 ENCOUNTER — Ambulatory Visit: Payer: Medicare PPO | Admitting: Cardiology

## 2023-06-09 ENCOUNTER — Ambulatory Visit: Payer: Medicare PPO

## 2023-06-09 ENCOUNTER — Encounter: Payer: Self-pay | Admitting: Internal Medicine

## 2023-06-09 ENCOUNTER — Ambulatory Visit
Admission: RE | Admit: 2023-06-09 | Discharge: 2023-06-09 | Disposition: A | Discharge status: Home / Self Care | Source: Ambulatory Visit | Attending: Internal Medicine | Admitting: Internal Medicine

## 2023-06-09 DIAGNOSIS — Z1231 Encounter for screening mammogram for malignant neoplasm of breast: Secondary | ICD-10-CM

## 2023-06-11 ENCOUNTER — Encounter: Payer: Self-pay | Admitting: Internal Medicine

## 2023-06-11 NOTE — Assessment & Plan Note (Signed)
Benefits from CPAP with good compliance and control Plan- continue auto 5-15 

## 2023-06-11 NOTE — Assessment & Plan Note (Signed)
 Indolent infection Plan- update labs. Update Chest CT in June as planned

## 2023-06-13 ENCOUNTER — Other Ambulatory Visit: Payer: Self-pay | Admitting: Internal Medicine

## 2023-06-13 ENCOUNTER — Encounter: Payer: Self-pay | Admitting: Cardiology

## 2023-06-13 ENCOUNTER — Ambulatory Visit: Payer: Medicare PPO | Attending: Cardiology | Admitting: Cardiology

## 2023-06-13 ENCOUNTER — Telehealth: Payer: Self-pay | Admitting: Internal Medicine

## 2023-06-13 VITALS — BP 152/74 | HR 67 | Resp 16 | Ht 65.0 in | Wt 173.6 lb

## 2023-06-13 DIAGNOSIS — I10 Essential (primary) hypertension: Secondary | ICD-10-CM

## 2023-06-13 DIAGNOSIS — N6320 Unspecified lump in the left breast, unspecified quadrant: Secondary | ICD-10-CM

## 2023-06-13 DIAGNOSIS — M79604 Pain in right leg: Secondary | ICD-10-CM

## 2023-06-13 DIAGNOSIS — R0602 Shortness of breath: Secondary | ICD-10-CM

## 2023-06-13 DIAGNOSIS — J84112 Idiopathic pulmonary fibrosis: Secondary | ICD-10-CM

## 2023-06-13 DIAGNOSIS — I350 Nonrheumatic aortic (valve) stenosis: Secondary | ICD-10-CM | POA: Diagnosis not present

## 2023-06-13 DIAGNOSIS — M79605 Pain in left leg: Secondary | ICD-10-CM

## 2023-06-13 MED ORDER — LABETALOL HCL 200 MG PO TABS: 200.0000 mg | ORAL_TABLET | Freq: Two times a day (BID) | ORAL | 3 refills | Status: AC

## 2023-06-13 MED ORDER — CHLORTHALIDONE 25 MG PO TABS: 25.0000 mg | ORAL_TABLET | ORAL | 3 refills | Status: AC

## 2023-06-13 NOTE — Patient Instructions (Signed)
 Medication Instructions:  Decrease chlorthalidone to 25 mg daily Start Labetalol 200 mg twice daily *If you need a refill on your cardiac medications before your next appointment, please call your pharmacy*   Lab Work: none If you have labs (blood work) drawn today and your tests are completely normal, you will receive your results only by: MyChart Message (if you have MyChart) OR A paper copy in the mail If you have any lab test that is abnormal or we need to change your treatment, we will call you to review the results.   Testing/Procedures: Your physician has requested that you have an ankle brachial index (ABI). During this test an ultrasound and blood pressure cuff are used to evaluate the arteries that supply the arms and legs with blood. Allow thirty minutes for this exam. There are no restrictions or special instructions.  Please note: We ask at that you not bring children with you during ultrasound (echo/ vascular) testing. Due to room size and safety concerns, children are not allowed in the ultrasound rooms during exams. Our front office staff cannot provide observation of children in our lobby area while testing is being conducted. An adult accompanying a patient to their appointment will only be allowed in the ultrasound room at the discretion of the ultrasound technician under special circumstances. We apologize for any inconvenience.    Follow-Up: At [facility], you and your health needs are our priority.  As part of our continuing mission to provide you with exceptional heart care, we have created designated Provider Care Teams.  These Care Teams include your primary Cardiologist (physician) and Advanced Practice Providers (APPs -  Physician Assistants and Nurse Practitioners) who all work together to provide you with the care you need, when you need it.  We recommend signing up for the patient portal called "MyChart".  Sign up information is provided on this After  Visit Summary.  MyChart is used to connect with patients for Virtual Visits (Telemedicine).  Patients are able to view lab/test results, encounter notes, upcoming appointments, etc.  Non-urgent messages can be sent to your provider as well.   To learn more about what you can do with MyChart, go to [email or web address].    Your next appointment:   12 month(s)  Provider:   Dr Jacinto Halim    Other Instructions    1st Floor: - Lobby - Registration  - Pharmacy  - Lab - Cafe  2nd Floor: - PV Lab - Diagnostic Testing (echo, CT, nuclear med)  3rd Floor: - Vacant  4th Floor: - TCTS (cardiothoracic surgery) - AFib Clinic - Structural Heart Clinic - Vascular Surgery  - Vascular Ultrasound  5th Floor: - [facility] (general and EP) - Clinical Pharmacy for coumadin, hypertension, lipid, weight-loss medications, and med management appointments    Valet parking services will be available as well.

## 2023-06-13 NOTE — Telephone Encounter (Signed)
 Called and left a VM for daughter to call back regarding scheduling for mother's appt. Pt's appt notes stated that the appt was scheduled with her and it is coordinated with her. I also called pt to make her aware of the reschedule and options in case she is able to get a hold of her prior to speaking to [place].   She stated she will talk to her. I have left the appt where it is at, but this will need to be rescheduled with Dr. Drue Second or another provider.

## 2023-06-13 NOTE — Progress Notes (Signed)
 Cardiology Office Note:  .   Date:  06/13/2023  ID:  Teresa French, DOB 30-Jul-1940, MRN [id number] PCP: Rometta Emery, MD  [facility] Providers Cardiologist:  None   History of Present Illness: .   Teresa French is a 83 y.o. 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  Patient has been having dyspnea on exertion for many years.  However over the past 6 months to year, patient states that her exercise capacity and dyspnea has markedly increased.  No leg edema, no chest pain or palpitations.  She is accompanied by her daughter who is a Publishing rights manager [name].  Except for continued severe dyspnea, generalized fatigue, decreased exercise tolerance, states that everything takes her twice as long continues to as she gets easily fatigued with exertional activity.  She has occasional sharp chest pain lasting a few seconds when she is upset but no exertional chest pain.  She still is living independently and goes shopping but takes that time.  She has not experienced any chest pain during exertional activities.   Labs   Lab Results  Component Value Date   NA 135 06/06/2023   K 3.4 (L) 06/06/2023   CO2 25 06/06/2023   GLUCOSE 111 (H) 06/06/2023   BUN 11 06/06/2023   CREATININE 0.69 06/06/2023   CALCIUM 9.4 06/06/2023   GFR 80.24 06/06/2023   GFRNONAA 69 07/10/2017      Latest Ref Rng & Units 06/06/2023    3:17 PM 04/19/2022    2:04 PM 04/08/2022    2:06 PM  BMP  Glucose 70 - 99 mg/dL 086  578  469   BUN 6 - 23 mg/dL 11  9  12    Creatinine 0.40 - 1.20 mg/dL 6.29  5.28  4.13   Sodium 135 - 145  mEq/L 135  135  136   Potassium 3.5 - 5.1 mEq/L 3.4  3.4  3.9   Chloride 96 - 112 mEq/L 99  96  95   CO2 19 - 32 mEq/L 25  29  31    Calcium 8.4 - 10.5 mg/dL 9.4  9.5  9.5       Latest Ref Rng & Units 06/06/2023    3:17 PM 04/08/2022    2:06 PM 01/23/2018    3:07 PM  CBC  WBC 4.0 - 10.5 K/uL 5.2  8.7  5.1   Hemoglobin 12.0 - 15.0 g/dL 24.4  01.0  27.2   Hematocrit 36.0 - 46.0 % 34.0  37.3  37.0   Platelets 150.0 - 400.0 K/uL 230.0  277.0  229    Lab Results  Component Value Date   HGBA1C 6.8 (H) 06/06/2023   ProBNP (last 3 results) Recent Labs    06/06/23 1517  PROBNP 155.0*    No results found for: "TSH"   Review of Systems  Constitutional: Positive for malaise/fatigue.  Cardiovascular:  Positive for chest pain (sharp and few seconds) and dyspnea on exertion. Negative for leg swelling.   Physical Exam:   VS:  BP (!) 152/74 (BP Location: Left Arm, Patient Position: Sitting, Cuff Size: Large)   Pulse 67   Resp 16   Ht 5\' 5"  (1.651 m)   Wt 173  lb 9.6 oz (78.7 kg)   SpO2 97%   BMI 28.89 kg/m    Wt Readings from Last 3 Encounters:  06/13/23 173 lb 9.6 oz (78.7 kg)  06/06/23 174 lb (78.9 kg)  12/29/22 178 lb 3.2 oz (80.8 kg)    Physical Exam Neck:     Vascular: Carotid bruit (bilateral (conducted from AS)) present. No JVD.  Cardiovascular:     Rate and Rhythm: Normal rate and regular rhythm.     Pulses: Intact distal pulses.     Heart sounds: S1 normal and S2 normal. Murmur heard.     Early systolic murmur is present with a grade of 2/6 at the upper right sternal border radiating to the neck.     No gallop.  Pulmonary:     Effort: Pulmonary effort is normal.     Breath sounds: Rhonchi (diffuse severe bilateral especially bases) present.  Abdominal:     General: Bowel sounds are normal.     Palpations: Abdomen is soft.  Musculoskeletal:     Right lower leg: No edema.     Left lower leg: No edema.    Studies Reviewed: .    Regadenoson Nuclear stress test  05/23/2022: Myocardial perfusion is normal. Overall LV systolic function is normal without regional wall motion abnormalities. Stress LV EF: 58%.  Nondiagnostic ECG stress. The heart rate response was consistent with Regadenoson.  No previous exam available for comparison. Low risk.  PCV ECHOCARDIOGRAM COMPLETE 04/15/2022   Narrative Echocardiogram 04/15/2022: Left ventricle cavity is normal in size. Moderate concentric hypertrophy of the left ventricle. Normal global wall motion. Normal diastolic filling pattern. Normal LV systolic function with EF 66%. Calculated EF 66%. Trileaflet aortic valve with no regurgitation. Mild calcification of the aortic valve annulus. Moderate aortic valve leaflet calcification. Mildly restricted aortic valve leaflets. Mild aortic valve stenosis. Peak velocity  2.25m/s, Peak Pressure Gradient  27.2 mmHg, Mean Gradient , AVA 1.3cm, Dimensionless Index 0.4 Structurally normal tricuspid valve with trace regurgitation. No evidence of pulmonary hypertension.  High-resolution CT scan 04/28/2023: 1. 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 Findings are suggestive of an alternative diagnosis (not UIP) per consensus guidelines: Diagnosis of Idiopathic Pulmonary Fibrosis: An Official ATS/ERS/JRS/ALAT Clinical Practice Guideline. Am Rosezetta Schlatter Crit Care Med Vol 198, Iss 5, [phone], Nov 19 2016. 2. New or evolving cavitary nodules may be due to a superimposed atypical or fungal infectious process. Difficult to exclude septic emboli. 3. Additional scattered small pulmonary nodules are stable. Recommend attention on follow-up. 4. Aortic atherosclerosis (ICD10-I70.0). Coronary artery calcification.  EKG:    EKG Interpretation Date/Time:  Tuesday June 13 2023 16:46:02 EDT Ventricular Rate:  72 PR Interval:  132 QRS Duration:  118 QT Interval:  416 QTC Calculation: 455 R Axis:   -64  Text  Interpretation: EKG 06/13/2023: Normal sinus rhythm at rate of 72 bpm, borderline left atrial enlargement, left anterior fascicular block.  Incomplete left bundle branch block.  Compared to 04/13/2022, no significant change. Confirmed by Delrae Rend [phone]) on 06/13/2023 4:54:37 PM     Medications and allergies    No Known Allergies   Current Outpatient Medications:    albuterol (PROVENTIL) (2.5 MG/3ML) 0.083% nebulizer solution, Take 3 mLs (2.5 mg total) by nebulization 2 (two) times daily as needed for wheezing or shortness of breath., Disp: 150 mL, Rfl: 12   albuterol (VENTOLIN HFA) 108 (90 Base) MCG/ACT inhaler, Inhale 2 puffs into the lungs every 4 (  four) hours as needed for wheezing or shortness of breath., Disp: 54 g, Rfl: 4   aspirin EC 81 MG tablet, Take 81 mg by mouth daily., Disp: , Rfl:    Calcium Citrate-Vitamin D (CALCIUM CITRATE+D3 PETITES) 200-250 MG-UNIT TABS, Take by mouth., Disp: , Rfl:    cetirizine (ZYRTEC) 10 MG tablet, Take 10 mg by mouth at bedtime., Disp: , Rfl:    Dorzolamide HCl-Timolol Mal PF 2-0.5 % SOLN, Place 1 drop into affected eye 2 (two) times daily., Disp: 60 each, Rfl: 3   fluticasone furoate-vilanterol (BREO ELLIPTA) 200-25 MCG/ACT AEPB, Inhale 1 puff then rinse mouth, daily, Disp: 180 each, Rfl: 4   gabapentin (NEURONTIN) 100 MG capsule, Take 100 mg by mouth 3 (three) times daily., Disp: , Rfl:    labetalol (NORMODYNE) 100 MG tablet, Take 100 mg by mouth 2 (two) times daily., Disp: , Rfl:    latanoprost (XALATAN) 0.005 % ophthalmic solution, Place 1 drop into both eyes at bedtime., Disp: , Rfl:    losartan (COZAAR) 50 MG tablet, Take 1 tablet (50 mg total) by mouth every evening., Disp: 90 tablet, Rfl: 1   Multiple Vitamin (MULTIVITAMIN) tablet, Take 1 tablet by mouth daily., Disp: , Rfl:    omeprazole (PRILOSEC) 20 MG capsule, Take by mouth daily., Disp: , Rfl:    potassium chloride (KLOR-CON) 20 MEQ packet, Take 20 mEq by mouth daily., Disp: , Rfl:     predniSONE (DELTASONE) 5 MG tablet, Take 5 mg by mouth daily., Disp: , Rfl:    pregabalin (LYRICA) 25 MG capsule, Take 25 mg by mouth daily., Disp: , Rfl:    REFRESH LIQUIGEL 1 % GEL, See admin instructions., Disp: , Rfl:    rosuvastatin (CRESTOR) 20 MG tablet, Take 20 mg by mouth daily., Disp: , Rfl:    celecoxib (CELEBREX) 100 MG capsule, Take 1 capsule (100 mg total) by mouth 2 (two) times daily. (Patient not taking: Reported on 06/13/2023), Disp: 60 capsule, Rfl: 0   chlorthalidone (HYGROTON) 25 MG tablet, Take 1 tablet (25 mg total) by mouth every morning., Disp: 90 tablet, Rfl: 3   fluticasone (FLONASE) 50 MCG/ACT nasal spray, 1 spray by Each Nare route daily. (Patient not taking: Reported on 06/13/2023), Disp: , Rfl:    ASSESSMENT AND PLAN: .      ICD-10-CM   1. Shortness of breath  R06.02     2. Primary hypertension  I10 chlorthalidone (HYGROTON) 25 MG tablet    3. Mild aortic stenosis  I35.0 EKG 12-Lead    4. IPF (idiopathic pulmonary fibrosis) (HCC) [J84.112]  J84.112     5. Pain in both lower extremities  M79.604 VAS [place] ABI WITH/WO TBI   M79.605       Meds ordered this encounter  Medications   chlorthalidone (HYGROTON) 25 MG tablet    Sig: Take 1 tablet (25 mg total) by mouth every morning.    Dispense:  90 tablet    Refill:  3    Medications Discontinued During This Encounter  Medication Reason   chlorthalidone (HYGROTON) 25 MG tablet Dose change   chlorthalidone (HYGROTON) 25 MG tablet Dose change   chlorthalidone (HYGROTON) 50 MG tablet Reorder    Aortic stenosis   She has mild aortic stenosis with no need for an echocardiogram this year or next unless symptoms change. Reassess in one year.  Pulmonary fibrosis   Significant lung scarring contributes to dyspnea and fatigue. Oxygen levels remain stable during a brief walk test, remained at  96% on walking for about 45 minutes but further evaluation is needed to assess oxygen needs during activities. Consider a  six-minute walk test with Dr. Jetty Duhamel to evaluate oxygen needs during activity. Use home oximetry to monitor oxygen levels during physical activity.  Hypertension   Blood pressure was elevated during the visit. She is currently on labetalol, which was increased to 200 mg twice daily from 100 mg twice daily improve control. [place] Pepper was discussed as a potential medication to help with blood pressure, diabetes, and heart protection. Increase labetalol to 200 mg BID. Consider reintroducing Jardiance if tolerated.  Hypercholesterolemia   She is on rosuvastatin 20 mg daily for cholesterol management. Lipid levels have not been checked recently.  She has no known coronary artery disease, but does have coronary calcification noted on CT scan but a normal stress test on 05/23/2022. Check lipid levels during the next routine blood work. Continue rosuvastatin 20 mg daily.  Peripheral neuropathy   She experiences leg pain, likely due to peripheral neuropathy from diabetes. Gabapentin is effective in managing pain. Continue gabapentin for leg pain management. Consider switching from Celebrex to Neurontin to avoid potential side effects.  Will check ABI to exclude significant PAD.  Signed,  Yates Decamp, MD, [facility] 06/13/2023, 5:18 PM [facility] [address] [place], [place] [address] Phone: [phone]. Fax:  [phone]

## 2023-06-14 ENCOUNTER — Telehealth: Payer: Self-pay | Admitting: Internal Medicine

## 2023-06-14 NOTE — Telephone Encounter (Addendum)
 Called and left message on daughters VM.  OK per Dr. Maple Hudson: "to bring patient in (can use held spot) for an O2 qualifying walk test on room air (for dx dyspnea on exertion). Then see me."  Note: this is not a 6 minute walk.  Need to schedule patient 30 min OV with Dr. Maple Hudson.

## 2023-06-14 NOTE — Telephone Encounter (Signed)
 Daughter called. Cardiologist Dr Jacinto Halim saw patient recently and didn't think her cardiac status including known AS explained her complaint of declining exercise tolerance over past 6 months. He suggested 6 minute walk, but daughter notes patient can't walk 6 minutes.  I will ask staff to bring her in (held spot) for an O2 qualifying walk test on room air for dx dyspnea on exertion. Then see me.

## 2023-06-15 ENCOUNTER — Ambulatory Visit: Payer: Medicare PPO | Admitting: Internal Medicine

## 2023-06-15 NOTE — Telephone Encounter (Signed)
 Called patients daughter.  Made OV for 06/20/2023 at 11:30 am.  Patient's daughter verbalized understanding.  Will do qualifying walk for oxygen prior to seeing Dr. Maple Hudson.

## 2023-06-18 NOTE — Progress Notes (Unsigned)
 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 HST 11/01/20- AHI11.4/ hr, desaturation to 70%, body weight 188 lbs Walk Test on Room Air- 2 laps very slowly - lowest O2 sat 98%, Max Hr 80. Watching her slow walk- issue may be residual stiffness and pain in hips from hx bilateral total hip replacement. Dr Campbell/ ID had told her there was no effective treatment for her atypical AFB infection       ========================================================================================================================================   06/06/23- 82 yoF never smoker, mother of Gwinda Passe, NP, followed for Bronchiectasis/ MAIC/ M abscessus), OSA/ OAP, complicated by  HTN, DVT, CAD, Aortic Atherosclerosis, CHF,  Peripheral Neuropathy, Rheumatoid Arthritis , Osteoporosis, Hyperlipidemia, Glaucoma, L Breast Mass, Environmental Allergies, Memory Loss, Covid Infection Jan 2022,   Dr Campbell/ ID had told her there was no effective treatment for her atypical AFB infection                                         - Ventolin hfa,  Neb albuterol, Flonase, prednisone 5 mg daily,        note [facility] Split Night Sleep Study 09/04/22-AHI 16/hr, desat to 69%, body weight 182 lbs       Granddaughter [name] here. Body weight today-178 lbs                       Currently staying with daughter.        Note diabetic range glucose at last BMET CPAP auto 5-15/ [facility]   new 09/13/22 Download compliance- 100%, AHI 0.9/hr Download reviewed.Doing well with CPAP.                                                                                        Pending updated HRCT June 5. 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 Coughs some occasional white mucus. No real change.. Mentions occasional sterna pain, especially when sitting up. Possibly reflux. Not exertional, nonradiating. She will try Tums.   We will refill inhalers and update labs. CT chest 04/28/23 IMPRESSION: 1. 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 Findings are suggestive of an alternative diagnosis (not UIP) per consensus guidelines: Diagnosis of Idiopathic Pulmonary Fibrosis: An Official ATS/ERS/JRS/ALAT Clinical Practice Guideline. Am Rosezetta Schlatter Crit Care Med Vol 198, Iss 5, [phone], Nov 19 2016. 2. New or evolving cavitary nodules may be due to a superimposed atypical or fungal infectious process. Difficult to exclude septic emboli. 3. Additional scattered small pulmonary nodules are stable. Recommend attention on follow-up. 4. Aortic atherosclerosis (ICD10-I70.0). Coronary artery calcification.  06/20/23- 50 yoF never smoker, mother of Gwinda Passe, NP, followed for Bronchiectasis/ MAIC/ M abscessus), OSA/ OAP,  Dyspnea,  complicated by  HTN, hxDVT, CAD,  Aortic Atherosclerosis, CHF,  Peripheral Neuropathy, Rheumatoid Arthritis , Osteoporosis, Hyperlipidemia, Glaucoma, L Breast Mass, Environmental Allergies, Memory Loss, Covid Infection Jan 2022,   Dr Campbell/ ID had told her there was no effective treatment for her atypical AFB infection                                         - Ventolin hfa,  Neb albuterol, Flonase, prednisone 5 mg daily,        note [employer] Split Night Sleep Study 09/04/22-AHI 16/hr, desat to 69%, body weight 182 lbs       Granddaughter [name] here. Body weight today-                     Currently staying with daughter.         CPAP auto 5-15/ [facility]   new 09/13/22 Download compliance- 100%, AHI 0.7/hr She had complained of increased DOE over last 6 months. Dr Jacinto Halim  Cardiology suggested walk test. Family don't think she can walk for 6 minutes. Pending update HRCT chest in June to check known cavitary nodules thought to be related to know M. abscessus NTM. Mild anemia, recent hgb 11.6. She denies cough, wheeze, night sweats, fever, adenopathy. Walk Test on Room Air- 2 laps very slowly - lowest O2 sat 98%, Max Hr 80. Watching her slow walk- issue may be residual stiffness and pain in hips from hx bilateral total hip replacement.   ROS-see HPI   + = positive Constitutional:    weight loss, night sweats, fevers, chills, +fatigue, lassitude. HEENT:    headaches, difficulty swallowing, tooth/dental problems, sore throat,       +sneezing, itching, ear ache, +nasal congestion, post nasal drip, snoring CV:    chest pain, orthopnea, PND, +swelling in lower extremities, anasarca,                                   dizziness, palpitations Resp:   +shortness of breath with exertion or at rest.                productive cough,   non-productive cough, coughing up of blood.              change in color of mucus.  wheezing.   Skin:    +rash or lesions. GI:  No-   heartburn, indigestion, abdominal pain, nausea, vomiting, diarrhea,                 change in bowel habits, loss of appetite GU: dysuria, change in color of urine, no urgency or frequency.   flank pain. MS:   joint pain, stiffness, decreased range of motion, back pain. Neuro-     nothing unusual Psych:  change in mood or affect.  depression or anxiety.   memory loss.  OBJ- Physical Exam General- Alert, Oriented, Affect-appropriate, Distress- none acute, + obese, +walks slowly with cane Skin- rash-none, lesions- none, excoriation- none Lymphadenopathy- none Head- atraumatic            Eyes- Gross vision intact, PERRLA, conjunctivae and secretions clear            Ears- Hearing, canals-normal            Nose- Clear, no-Septal dev, mucus, polyps, erosion, perforation  Throat- Mallampati IV , mucosa  clear , drainage- none, tonsils- atrophic, +teeth Neck- flexible , trachea midline, no stridor , thyroid nl, carotid no bruit Chest - symmetrical excursion , unlabored           Heart/CV- RRR , + murmur +1S, no gallop  , no rub, nl s1 s2                           - JVD- none , edema- none, stasis changes- none, varices- none           Lung- +few faint crackles, wheeze- none, cough- none , dullness-none, rub- none           Chest wall-  Abd-  Br/ Gen/ Rectal- Not done, not indicated Extrem- cyanosis- none, clubbing, none, atrophy- none, strength- nl Neuro- grossly intact to observation

## 2023-06-19 NOTE — Telephone Encounter (Signed)
 Formed signed and faxed to matrix, copy placed in scan

## 2023-06-20 ENCOUNTER — Encounter: Payer: Self-pay | Admitting: Internal Medicine

## 2023-06-20 ENCOUNTER — Telehealth: Payer: Self-pay | Admitting: Internal Medicine

## 2023-06-20 ENCOUNTER — Ambulatory Visit: Admitting: Internal Medicine

## 2023-06-20 VITALS — BP 126/62 | HR 71 | Temp 97.9°F | Ht 65.0 in | Wt 175.8 lb

## 2023-06-20 DIAGNOSIS — Z96641 Presence of right artificial hip joint: Secondary | ICD-10-CM

## 2023-06-20 DIAGNOSIS — J4530 Mild persistent asthma, uncomplicated: Secondary | ICD-10-CM

## 2023-06-20 DIAGNOSIS — R2681 Unsteadiness on feet: Secondary | ICD-10-CM

## 2023-06-20 DIAGNOSIS — G4733 Obstructive sleep apnea (adult) (pediatric): Secondary | ICD-10-CM | POA: Diagnosis not present

## 2023-06-20 DIAGNOSIS — A318 Other mycobacterial infections: Secondary | ICD-10-CM

## 2023-06-20 DIAGNOSIS — R0609 Other forms of dyspnea: Secondary | ICD-10-CM

## 2023-06-20 NOTE — Patient Instructions (Addendum)
 Order- schedule PFT  dyspnea on exertion (when available)  Order [facility]    Chyrel Masson, PT)    deconditioning, unstable gait  Keep appointment in June 5 for chest CT to follow-up the lung nodules

## 2023-06-20 NOTE — Telephone Encounter (Signed)
 Hey there could you please cancel the DME order and put it in as a referral to physical therapy? I am no able to send the last referral because it is for DME only.  Thank you!

## 2023-06-21 ENCOUNTER — Encounter: Payer: Self-pay | Admitting: Internal Medicine

## 2023-06-21 NOTE — Assessment & Plan Note (Signed)
 Hip replacements, RA, deconditioning and her age all contribute to reduced exercise tolerance. Known lung and heart problems alone probably contribute less to limitation. Plan- refer for Physical Therapy. For evaluation and treatment. Daughter-NP- requests [facility].

## 2023-06-21 NOTE — Assessment & Plan Note (Signed)
 Benefits from CPAP with excellent compliance and control supervised by daughter with whom she is staying Plan- continue auto 5-15

## 2023-06-21 NOTE — Assessment & Plan Note (Addendum)
 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 NTM may explain the cavitary nodules seen on CT. Watching for stability, but she has been told by ID that there is no effective Rx available for her for this infection. She is denying symptoms now. Plan comparison chest CT pending June, 2025.

## 2023-06-21 NOTE — Assessment & Plan Note (Signed)
Plan PFT 

## 2023-06-27 ENCOUNTER — Ambulatory Visit: Attending: Internal Medicine | Admitting: Physical Therapy

## 2023-06-27 DIAGNOSIS — R2681 Unsteadiness on feet: Secondary | ICD-10-CM | POA: Insufficient documentation

## 2023-06-27 DIAGNOSIS — R2689 Other abnormalities of gait and mobility: Secondary | ICD-10-CM | POA: Insufficient documentation

## 2023-06-27 DIAGNOSIS — M6281 Muscle weakness (generalized): Secondary | ICD-10-CM | POA: Insufficient documentation

## 2023-06-29 ENCOUNTER — Ambulatory Visit (INDEPENDENT_AMBULATORY_CARE_PROVIDER_SITE_OTHER): Admitting: Orthopedic Surgery

## 2023-06-29 ENCOUNTER — Other Ambulatory Visit: Payer: Self-pay

## 2023-06-29 ENCOUNTER — Ambulatory Visit (INDEPENDENT_AMBULATORY_CARE_PROVIDER_SITE_OTHER): Payer: Self-pay

## 2023-06-29 ENCOUNTER — Ambulatory Visit: Admitting: Internal Medicine

## 2023-06-29 ENCOUNTER — Encounter: Payer: Self-pay | Admitting: Internal Medicine

## 2023-06-29 ENCOUNTER — Encounter: Payer: Self-pay | Admitting: Orthopedic Surgery

## 2023-06-29 ENCOUNTER — Ambulatory Visit: Payer: Medicare PPO | Admitting: Internal Medicine

## 2023-06-29 ENCOUNTER — Ambulatory Visit: Admitting: Physical Therapy

## 2023-06-29 VITALS — BP 144/71 | HR 68 | Temp 97.7°F | Wt 171.8 lb

## 2023-06-29 DIAGNOSIS — R2689 Other abnormalities of gait and mobility: Secondary | ICD-10-CM | POA: Diagnosis present

## 2023-06-29 DIAGNOSIS — G8929 Other chronic pain: Secondary | ICD-10-CM

## 2023-06-29 DIAGNOSIS — M25561 Pain in right knee: Secondary | ICD-10-CM

## 2023-06-29 DIAGNOSIS — J479 Bronchiectasis, uncomplicated: Secondary | ICD-10-CM | POA: Diagnosis not present

## 2023-06-29 DIAGNOSIS — Z23 Encounter for immunization: Secondary | ICD-10-CM

## 2023-06-29 DIAGNOSIS — M545 Low back pain, unspecified: Secondary | ICD-10-CM | POA: Diagnosis not present

## 2023-06-29 DIAGNOSIS — A318 Other mycobacterial infections: Secondary | ICD-10-CM

## 2023-06-29 DIAGNOSIS — M6281 Muscle weakness (generalized): Secondary | ICD-10-CM | POA: Diagnosis present

## 2023-06-29 DIAGNOSIS — R2681 Unsteadiness on feet: Secondary | ICD-10-CM

## 2023-06-29 DIAGNOSIS — M25562 Pain in left knee: Secondary | ICD-10-CM

## 2023-06-29 NOTE — Therapy (Unsigned)
 OUTPATIENT PHYSICAL THERAPY NEURO EVALUATION   Patient Name: Teresa French MRN: [id number] DOB:03-09-1941, 83 y.o., female Today's Date: 06/30/2023   PCP: Rometta Emery, MD REFERRING PROVIDER: Waymon Budge, MD   END OF SESSION:  PT End of Session - 06/29/23 0814     Visit Number 1    Number of Visits 13    Date for PT Re-Evaluation 08/11/23    Authorization Type [employer]-auth submitted    PT Start Time 0804    PT Stop Time 0846    PT Time Calculation (min) 42 min    Equipment Utilized During Treatment Gait belt    Activity Tolerance Patient tolerated treatment well    Behavior During Therapy WFL for tasks assessed/performed             Past Medical History:  Diagnosis Date   Arthritis    Asthma    Borderline diabetes    Breast mass, left 07/10/2017   06/19/17: mammos: lobulated mass sup & lat to nipple; [place]: 1.4x0.5x1.0 cm  Bi-RADS4   Complication of anesthesia    "I SLEEP A VERY VERY LONG TIME"   Eczema    Environmental allergies    Glaucoma    History of transfusion    HTN (hypertension), benign 07/10/2017   Hyperlipidemia    Hypertension    Memory loss    Tingling    OF FEET   Past Surgical History:  Procedure Laterality Date   BREAST BIOPSY Bilateral    3-5 EACH BREAST   BREAST CYST ASPIRATION     CERVICAL LAMINECTOMY     DILATION AND CURETTAGE OF UTERUS     GANGLION CYST EXCISION     X3   JOINT REPLACEMENT  2007   left hip   TOTAL HIP ARTHROPLASTY Right 11/21/2014   Procedure: RIGHT TOTAL HIP ARTHROPLASTY ANTERIOR APPROACH;  Surgeon: Kathryne Hitch, MD;  Location: [facility];  Service: Orthopedics;  Laterality: Right;   Patient Active Problem List   Diagnosis Date Noted   Bronchiectasis (HCC) 10/28/2020   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   COVID-19 09/16/2020   Asthmatic bronchitis 09/08/2020   OSA (obstructive sleep apnea) 09/08/2020   DVT (deep venous thrombosis) (HCC) 02/12/2019   Breast mass, left 07/10/2017    HTN (hypertension), benign 07/10/2017   Peripheral neuropathy 03/08/2016   Borderline diabetes    Rheumatoid arthritis (HCC) 10/29/2015   DJD (degenerative joint disease) 10/29/2015   Osteoporosis 10/29/2015   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   Osteoarthritis of right hip 11/21/2014   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    ONSET DATE: 06/20/2023  REFERRING DIAG: R26.81 (ICD-10-CM) - Unstable gait   THERAPY DIAG:  Unsteadiness on feet  Other abnormalities of gait and mobility  Muscle weakness (generalized)  Rationale for Evaluation and Treatment: Rehabilitation  SUBJECTIVE:  SUBJECTIVE STATEMENT: Have had 3 falls, with the last one in October.  Tried to have Home Health after my recent fall, but that wasn't consistent and didn't work out.  I want to walk better. Have neuropathy and that is very painful.   Pt accompanied by: self  PERTINENT HISTORY: Bronchiectasis/ [facility]IC/ M abscessus), HTN, DVT, CAD, Aortic Atherosclerosis, Peripheral Neuropathy, Rheumatoid Arthritis , Osteoporosis, Hyperlipidemia, Glaucoma, L Breast Mass, Environmental Allergies, Memory Loss, Covid Infection Jan 2022, bilateral THR  PAIN:  Are you having pain? Yes: NPRS scale: 10/10 Pain location: legs Pain description: neuropathy, needles Aggravating factors: worse at night Relieving factors: trying to take the medication  PRECAUTIONS: Fall  RED FLAGS: None   WEIGHT BEARING RESTRICTIONS: No  FALLS: Has patient fallen in last 6 months? Yes. Number of falls 2 One fall was in the bathroom and then another in a parking lot. LIVING ENVIRONMENT: Lives with: lives with their daughter and she lives in [place] and stays with daughter here in [place] Lives in:  House/apartment Stairs:  2 story home Has following equipment at home: Single point cane  PLOF: Independent with household mobility with device and Independent with community mobility with device  PATIENT GOALS: I want to walk again  OBJECTIVE:  Note: Objective measures were completed at Evaluation unless otherwise noted.  DIAGNOSTIC FINDINGS: NA  COGNITION: Overall cognitive status: Within functional limits for tasks assessed   SENSATION: Light touch: Impaired  and increased sensitivity more distally  POSTURE: rounded shoulders  LOWER EXTREMITY ROM:     Active  Right Eval Left Eval  Hip flexion    Hip extension    Hip abduction    Hip adduction    Hip internal rotation    Hip external rotation    Knee flexion    Knee extension  -15  Ankle dorsiflexion neutral 5  Ankle plantarflexion    Ankle inversion    Ankle eversion     (Blank rows = not tested)  LOWER EXTREMITY MMT:    MMT Right Eval Left Eval  Hip flexion 4 4  Hip extension    Hip abduction    Hip adduction    Hip internal rotation    Hip external rotation    Knee flexion 4 3+  Knee extension 4 3+  Ankle dorsiflexion 4 3+  Ankle plantarflexion    Ankle inversion    Ankle eversion    (Blank rows = not tested)   TRANSFERS: Assistive device utilized:  BUE support   Sit to stand: CGA Stand to sit: CGA  GAIT: Gait pattern: step through pattern, decreased step length- Right, decreased step length- Left, antalgic, and wide BOS Distance walked: 80 ft Assistive device utilized: Single point cane Level of assistance: CGA Comments: Looks down at the ground  FUNCTIONAL TESTS:  5 times sit to stand: 45.62 sec with BUE support 10 meter walk test: 32.85 sec  (1 ft/sec) Vitals 98%, HR 66 bpm, then after gait:  95%, HR 81   M-CTSIB  Condition 1: Firm Surface, EO 30 Sec, Mild Sway  Condition 2: Firm Surface, EC 30 Sec, Mild Sway  Condition 3: Foam Surface, EO NT Sec,  NT  Sway  Condition 4: Foam  Surface, EC NT  TREATMENT DATE: 06/29/2023    PATIENT EDUCATION: Education details: Initial eval, POC Person educated: Patient Education method: Explanation Education comprehension: verbalized understanding  HOME EXERCISE PROGRAM: Not yet initiated  GOALS: Goals reviewed with patient? Yes  SHORT TERM GOALS: Target date: 07/28/2023  Pt will be independent with HEP for improved balance, strength, gait. Baseline: Goal status: INITIAL  2.  Pt will improve 5x sit<>stand to less than or equal to 35 sec to demonstrate improved functional strength and transfer efficiency. Baseline: 45.62 Goal status: INITIAL   LONG TERM GOALS: Target date: 08/11/2023  Pt will be independent with HEP for improved balance, strength, gait. Baseline:  Goal status: INITIAL  2.  Pt will improve 5x sit<>stand to less than or equal to 25 sec to demonstrate improved functional strength and transfer efficiency. Baseline: 45 sec Goal status: INITIAL  3.  Pt will improve Berg score by at least 8 points from baseline, to decrease fall risk. Baseline: TBA Goal status: INITIAL  4.  Pt will improve gait velocity to at least 1.8 ft/sec for improved gait efficiency and safety. Baseline: 1 ft/sec Goal status: INITIAL   ASSESSMENT:  CLINICAL IMPRESSION: Patient is a 83 y.o. female who was seen today for physical therapy evaluation and treatment for unsteady gait and hx of falls.  She reports 2-3 falls in the last 6 months.  She presents to [facility] with decreased strength, decreased flexibility, decreased balance, decreased independence with transfers, decreased gait velocity and stability.  She is at increased fall risk per FTSTS and gait velocity scores.  She will benefit from skilled PT to address the above stated deficits to decrease fall risk and improve overall functional mobility.    OBJECTIVE IMPAIRMENTS: Abnormal gait, decreased balance, decreased mobility, difficulty walking, decreased ROM, decreased strength, and impaired flexibility.   ACTIVITY LIMITATIONS: standing, stairs, transfers, and locomotion level  PARTICIPATION LIMITATIONS: cleaning, laundry, community activity, and church  PERSONAL FACTORS: 3+ comorbidities: see PMH above (pt does live in [place] and visits her daughter here in [place] for extended periods of time)  are also affecting patient's functional outcome.   REHAB POTENTIAL: Good  CLINICAL DECISION MAKING: Evolving/moderate complexity  EVALUATION COMPLEXITY: Moderate  PLAN:  PT FREQUENCY: 2x/week  PT DURATION: 6 weeks plus eval  PLANNED INTERVENTIONS: 97110-Therapeutic exercises, 97530- Therapeutic activity, [id number]- Neuromuscular re-education, 97535- Self Care, 16109- Manual therapy, [phone]- Gait training, Patient/Family education, Balance training, Stair training, and DME instructions  PLAN FOR NEXT SESSION: Check Berg.  Initiate HEP for flexibility, lower extremity strengthening, balance, gait training   Idelia Caudell W., PT 06/30/2023, 7:55 AM  [facility] at [facility] [address] [place], [address] [place], [place] [address] Phone # [phone] Fax # [phone]    Referring diagnosis? R26.81 (ICD-10-CM) - Unstable gait  Treatment diagnosis? (if different than referring diagnosis) R26.81, R26.89, M62.81 What was this (referring dx) caused by? []  Surgery [x]  Fall []  Ongoing issue []  Arthritis []  Other: ____________  Laterality: []  Rt []  Lt [x]  Both  Check all possible CPT codes:  *CHOOSE 10 OR LESS*    See Planned Interventions listed in the Plan section of the Evaluation.

## 2023-06-29 NOTE — Progress Notes (Signed)
 RFV: follow up for m.abscess nodularcavitary pulmonary disease  Patient ID: Teresa French, female   DOB: 1940-11-06, 83 y.o.   MRN: [id number]  HPI Teresa French is a 82yo F with RA, bronchiectasis followed by dr young, and also hx of pulmonary NTM, m.abscessus cavitary pulmonary disease.  She was successfully treated for Mycobacterium avium pneumonia with 10 months of azithromycin , ethambutol  and rifampin , completing treatment in July 2018.  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  Then in 2022, found to have m.abscessus. She was previously seen on annual basis. Last seen march 2024 with dr Daina Drum. She has not been treated for m.abscessus. she recently had chest CT in feburary showing new caviatry nodules in RUL, previous PL LLL cavitary nodules, ongoing bronchiectasis  and pulmonary parenchymal pattern concern for ILD but similar to 2024 imaging. She has intermittent cough but not productive, not necessarily having DOE. Does have some memory disorder per her daughter. At this time they realize that treatment side effects could have impact on QoL more so than current symptoms.   -------------------------- Component Ref Range & Units (hover) 2 yr ago 5 yr ago  Organism MYCOBACTERIUM ABSCESSUS COMPLEX MYCOBACTERIUM PORCINUM  Amikacin 16 S <=1 S  Ceoxitin 64 I 16 S  Ciprofloxacin >4 R 0.25 S  Clarithromycin >16 R >16 R  Comment: This assay detects inducible resistance to macrolides due to erm gene by extended 14 days incubation for clarithromycin.  Doxycycline  >8 R >16 R  Imipenem 16 I 8 I  Linezolid 32 R 2 S  Moxifloxxacin >4 R <=0.25 S  Tigecycline 0.12 <=0.015 R, CM  trimethoprim/Sulfamethoxazole >4/76 R 1/19 S CM      Outpatient Encounter Medications as of 06/29/2023  Medication Sig   albuterol  (PROVENTIL ) (2.5 MG/3ML) 0.083% nebulizer solution Take 3 mLs (2.5 mg total) by nebulization 2 (two) times daily as  needed for wheezing or shortness of breath.   albuterol  (VENTOLIN  HFA) 108 (90 Base) MCG/ACT inhaler Inhale 2 puffs into the lungs every 4 (four) hours as needed for wheezing or shortness of breath.   aspirin  EC 81 MG tablet Take 81 mg by mouth daily.   Calcium  Citrate-Vitamin D (CALCIUM  CITRATE+D3 PETITES) 200-250 MG-UNIT TABS Take by mouth.   cetirizine (ZYRTEC) 10 MG tablet Take 10 mg by mouth at bedtime.   chlorthalidone  (HYGROTON ) 25 MG tablet Take 1 tablet (25 mg total) by mouth every morning.   Dorzolamide  HCl-Timolol  Mal PF 2-0.5 % SOLN Place 1 drop into affected eye 2 (two) times daily.   fluticasone  (FLONASE) 50 MCG/ACT nasal spray    fluticasone  furoate-vilanterol (BREO ELLIPTA ) 200-25 MCG/ACT AEPB Inhale 1 puff then rinse mouth, daily   gabapentin (NEURONTIN) 100 MG capsule Take 100 mg by mouth 3 (three) times daily.   JARDIANCE 10 MG TABS tablet Take 10 mg by mouth daily.   labetalol  (NORMODYNE ) 200 MG tablet Take 1 tablet (200 mg total) by mouth 2 (two) times daily.   latanoprost  (XALATAN ) 0.005 % ophthalmic solution Place 1 drop into both eyes at bedtime.   losartan  (COZAAR ) 50 MG tablet Take 1 tablet (50 mg total) by mouth every evening.   Multiple Vitamin (MULTIVITAMIN) tablet Take 1 tablet by mouth daily.   omeprazole (PRILOSEC) 20 MG capsule Take by mouth daily.   potassium chloride  (KLOR-CON ) 20 MEQ packet Take 20 mEq by mouth daily.   predniSONE (DELTASONE) 5 MG tablet Take 5 mg by mouth daily.   REFRESH  LIQUIGEL 1 % GEL See admin instructions.   rosuvastatin  (CRESTOR ) 20 MG tablet Take 20 mg by mouth daily.   celecoxib  (CELEBREX ) 100 MG capsule Take 1 capsule (100 mg total) by mouth 2 (two) times daily. (Patient not taking: Reported on 06/29/2023)   pregabalin  (LYRICA ) 25 MG capsule Take 25 mg by mouth daily. (Patient not taking: Reported on 06/29/2023)   No facility-administered encounter medications on file as of 06/29/2023.     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 appears well-developed and well-nourished. No distress.  HENT: Teresa French/AT, PERRLA, no scleral icterus Mouth/Throat: Oropharynx is clear and moist. No oropharyngeal exudate.  Cardiovascular: Normal rate, regular rhythm and normal heart sounds. Exam reveals no gallop and no friction rub.  No murmur heard.  Pulmonary/Chest: Effort normal and breath sounds normal. No respiratory distress.  has no wheezes. Fine crackles at bases. Neck = supple, no nuchal rigidity Abdominal: Soft. Bowel sounds are normal.  exhibits no distension. There is no tenderness.  Lymphadenopathy: no cervical adenopathy. No axillary adenopathy Neurological: alert and oriented to person, place, and time.  Skin: Skin is warm and dry. No rash noted. No erythema.  Psychiatric: a normal mood and affect.  behavior is normal.    CBC Lab Results   Component Value Date   WBC 5.2 06/06/2023   RBC 3.95 06/06/2023   HGB 11.6 (L) 06/06/2023   HCT 34.0 (L) 06/06/2023   PLT 230.0 06/06/2023   MCV 85.9 06/06/2023   MCH 28.5 01/23/2018   MCHC 34.2 06/06/2023   RDW 14.9 06/06/2023   LYMPHSABS 1.4 06/06/2023   MONOABS 0.4 06/06/2023   EOSABS 0.1 06/06/2023    BMET Lab Results  Component Value Date   NA 135 06/06/2023   K 3.4 (L) 06/06/2023   CL 99 06/06/2023   CO2 25 06/06/2023   GLUCOSE 111 (H) 06/06/2023   BUN 11 06/06/2023   CREATININE 0.69 06/06/2023   CALCIUM  9.4 06/06/2023   GFRNONAA 69 07/10/2017   GFRAA 80 07/10/2017      Assessment and Plan M.abscessus lung infection and bronchiectasis = PFT testing coming up to decide if needs supplemental oxygen on June 5th. Will reach out to dr young to see if   Teresa French benefit from saline nebulizer to do accapella valve to help with pulmonary hygiene  At this time she is not  significantly symptomatic from NTM infeciton, Recommend to hold off on m.abscessus treatment.   Health maintenance = Prevnar 20 today; new covid booster today  RTC in 6- 12 month unless symptoms worsen we wil see sooner.  I have personally spent 40  minutes involved in face-to-face and non-face-to-face activities for this patient on the day of the visit. Professional time spent includes the following activities: Preparing to see the patient (review of tests), Obtaining and/or reviewing separately obtained history (admission/discharge record), Performing a medically appropriate examination and/or evaluation , Ordering medications/tests/procedures, referring and communicating with other health care professionals, Documenting clinical information in the EMR, Independently interpreting results (not separately reported), Communicating results to the patient/family/caregiver, Counseling and educating the patient/family/caregiver and Care coordination (not separately reported).

## 2023-06-30 ENCOUNTER — Ambulatory Visit (HOSPITAL_COMMUNITY)
Admission: RE | Admit: 2023-06-30 | Discharge: 2023-06-30 | Disposition: A | Discharge status: Home / Self Care | Source: Ambulatory Visit | Attending: Cardiology | Admitting: Cardiology

## 2023-06-30 DIAGNOSIS — M79605 Pain in left leg: Secondary | ICD-10-CM | POA: Diagnosis present

## 2023-06-30 DIAGNOSIS — M79604 Pain in right leg: Secondary | ICD-10-CM | POA: Diagnosis present

## 2023-06-30 NOTE — Progress Notes (Signed)
 Patient's daughter aware of results  ABI 06/30/23  Right ABI indicates noncompressible arteries, toe brachial index is normal. Left ABI indicates noncompressible arteries, toe brachial index is normal. Normal triphasic waveforms noted bilaterally at the level of the ankle.

## 2023-07-02 LAB — VAS US ABI WITH/WO TBI

## 2023-07-03 ENCOUNTER — Ambulatory Visit

## 2023-07-03 DIAGNOSIS — R2681 Unsteadiness on feet: Secondary | ICD-10-CM | POA: Diagnosis not present

## 2023-07-03 DIAGNOSIS — M6281 Muscle weakness (generalized): Secondary | ICD-10-CM

## 2023-07-03 DIAGNOSIS — R2689 Other abnormalities of gait and mobility: Secondary | ICD-10-CM

## 2023-07-03 NOTE — Therapy (Signed)
 OUTPATIENT PHYSICAL THERAPY NEURO TREATMENT   Patient Name: Teresa French MRN: [id number] DOB:1941-01-05, 83 y.o., female Today's Date: 07/03/2023   PCP: Davida Espy, MD REFERRING PROVIDER: Faustina Hood, MD   END OF SESSION:  PT End of Session - 07/03/23 0930     Visit Number 2    Number of Visits 13    Date for PT Re-Evaluation 08/11/23    Authorization Type [employer]-auth submitted    PT Start Time 0930    PT Stop Time 1015    PT Time Calculation (min) 45 min    Equipment Utilized During Treatment Gait belt    Activity Tolerance Patient tolerated treatment well    Behavior During Therapy WFL for tasks assessed/performed             Past Medical History:  Diagnosis Date   Arthritis    Asthma    Borderline diabetes    Breast mass, left 07/10/2017   06/19/17: mammos: lobulated mass sup & lat to nipple; US : 1.4x0.5x1.0 cm  Bi-RADS4   Complication of anesthesia    "I SLEEP A VERY VERY LONG TIME"   Eczema    Environmental allergies    Glaucoma    History of transfusion    HTN (hypertension), benign 07/10/2017   Hyperlipidemia    Hypertension    Memory loss    Tingling    OF FEET   Past Surgical History:  Procedure Laterality Date   BREAST BIOPSY Bilateral    3-5 EACH BREAST   BREAST CYST ASPIRATION     CERVICAL LAMINECTOMY     DILATION AND CURETTAGE OF UTERUS     GANGLION CYST EXCISION     X3   JOINT REPLACEMENT  2007   left hip   TOTAL HIP ARTHROPLASTY Right 11/21/2014   Procedure: RIGHT TOTAL HIP ARTHROPLASTY ANTERIOR APPROACH;  Surgeon: Arnie Lao, MD;  Location: [facility];  Service: Orthopedics;  Laterality: Right;   Patient Active Problem List   Diagnosis Date Noted   Bronchiectasis (HCC) 10/28/2020   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   COVID-19 09/16/2020   Asthmatic bronchitis 09/08/2020   OSA (obstructive sleep apnea) 09/08/2020   DVT (deep venous thrombosis) (HCC) 02/12/2019   Breast mass, left 07/10/2017    HTN (hypertension), benign 07/10/2017   Peripheral neuropathy 03/08/2016   Borderline diabetes    Rheumatoid arthritis (HCC) 10/29/2015   DJD (degenerative joint disease) 10/29/2015   Osteoporosis 10/29/2015   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  Pt accompanied by: self  PERTINENT HISTORY: Bronchiectasis/ MAIC/ M abscessus), HTN, DVT, CAD, Aortic Atherosclerosis, Peripheral Neuropathy, Rheumatoid Arthritis , Osteoporosis, Hyperlipidemia, Glaucoma, L Breast Mass, Environmental Allergies, Memory Loss, Covid Infection Jan 2022, bilateral THR  PAIN:  Are you having pain? Yes: NPRS scale: 8/10 Pain location: feet Pain description: neuropathy, needles Aggravating factors: worse at night Relieving factors: trying to take the medication  PRECAUTIONS: Fall  RED FLAGS: None   WEIGHT BEARING RESTRICTIONS: No  FALLS: Has patient fallen in last 6 months? Yes. Number of falls 2 One fall was in the bathroom and then another in a parking lot. LIVING ENVIRONMENT: Lives with: lives with their daughter and she lives in [place] and stays with daughter here in [place] Lives in: House/apartment Stairs:  2 story home Has following equipment at home: Single point cane  PLOF: Independent with household mobility with  device and Independent with community mobility with device  PATIENT GOALS: I want to walk again  OBJECTIVE:   TODAY'S TREATMENT: 07/03/23 Activity Comments  Berg Balance Test 38/56  Gait training W/ rollator Gait speed: 1.43 ft/sec                 [facility] PT Assessment - 07/03/23 0001       Standardized Balance Assessment   Standardized Balance Assessment Berg Balance Test      Berg Balance Test   Sit to Stand Able to stand  independently using hands    Standing Unsupported Able to stand safely 2 minutes    Sitting with Back Unsupported but Feet Supported on Floor or Stool Able to sit safely and securely 2 minutes    Stand to Sit Controls descent by using hands    Transfers Able to transfer safely, definite need of hands    Standing Unsupported with Eyes Closed Able to stand 10 seconds safely    Standing Unsupported with Feet Together Able to place feet together independently and stand for 1 minute with supervision    From Standing, Reach Forward with Outstretched Arm Can reach forward >5 cm safely (2")    From Standing Position, Pick up Object from Floor Able to pick up shoe, needs supervision    From Standing Position, Turn to Look Behind Over each Shoulder Looks behind one side only/other side shows less weight shift    Turn 360 Degrees Able to turn 360 degrees safely but slowly    Standing Unsupported, Alternately Place Feet on Step/Stool Able to complete 4 steps without aid or supervision    Standing Unsupported, One Foot in [employer] balance while stepping or standing    Standing on One Leg Able to lift leg independently and hold equal to or more than 3 seconds    Total Score 38             Note: Objective measures were completed at Evaluation unless otherwise noted.  DIAGNOSTIC FINDINGS: NA  COGNITION: Overall cognitive status: Within functional limits for tasks assessed   SENSATION: Light touch: Impaired  and increased sensitivity more distally  POSTURE:  rounded shoulders  LOWER EXTREMITY ROM:     Active  Right Eval Left Eval  Hip flexion    Hip extension    Hip abduction    Hip adduction    Hip internal rotation    Hip external rotation    Knee flexion    Knee extension  -15  Ankle dorsiflexion neutral 5  Ankle plantarflexion    Ankle inversion    Ankle eversion     (Blank rows = not tested)  LOWER EXTREMITY MMT:    MMT Right Eval Left Eval  Hip flexion 4 4  Hip extension    Hip abduction    Hip adduction    Hip internal rotation    Hip external rotation    Knee flexion 4 3+  Knee extension 4 3+  Ankle dorsiflexion 4 3+  Ankle plantarflexion    Ankle inversion    Ankle eversion    (Blank rows = not tested)   TRANSFERS: Assistive device utilized:  BUE support   Sit to stand: CGA Stand to sit: CGA  GAIT: Gait pattern: step through pattern, decreased step length- Right, decreased step length- Left, antalgic, and wide BOS Distance walked: 80 ft Assistive device utilized: Single point cane Level of assistance: CGA Comments: Looks down at the ground  FUNCTIONAL TESTS:  5 times sit to stand: 45.62 sec with BUE support 10 meter walk test: 32.85 sec  (1 ft/sec) Vitals 98%, HR 66 bpm, then after gait:  95%, HR 81   M-CTSIB  Condition 1: Firm Surface, EO 30 Sec, Mild Sway  Condition 2: Firm Surface, EC 30 Sec, Mild Sway  Condition 3: Foam Surface, EO NT Sec,  NT  Sway  Condition 4: Foam Surface, EC NT                                                                                                                                TREATMENT DATE: 06/29/2023    PATIENT EDUCATION: Education details: Initial eval, POC Person educated: Patient Education method: Explanation Education comprehension: verbalized understanding  HOME EXERCISE PROGRAM: Not yet initiated  GOALS: Goals reviewed with patient? Yes  SHORT TERM GOALS: Target date: 07/28/2023  Pt will be independent with HEP for improved balance,  strength, gait. Baseline: Goal status: INITIAL  2.  Pt will improve 5x sit<>stand to less than or equal to 35 sec to demonstrate improved functional strength and transfer efficiency. Baseline: 45.62 Goal status: INITIAL   LONG TERM GOALS: Target date: 08/11/2023  Pt will be independent with HEP for improved balance, strength, gait. Baseline:  Goal status: INITIAL  2.  Pt will improve 5x sit<>stand to less than or equal to 25 sec to demonstrate improved functional strength and transfer efficiency. Baseline: 45 sec Goal status: INITIAL  3.  Pt will improve Berg score by at least 8 points from baseline, to decrease fall risk. Baseline: TBA Goal status: INITIAL  4.  Pt will improve gait velocity to at least 1.8 ft/sec for improved gait efficiency and safety. Baseline: 1 ft/sec Goal status: INITIAL   ASSESSMENT:  CLINICAL IMPRESSION: Berg balance Test revealing score 38/56 indicating high risk for falls with difficulty under narrow BOS or single limb stance.  Gait training with rollator for more support and to enable faster  walk speed which benefits greatly increasing speed from 0.5 mph to 1.0 mph.  Instruction in use of device for safe mobility and seated rest periods. Provided referecne for "space saver" rollator for added portability.  Continued sessions to progress POC to improve mobility and reduce risk for falls  OBJECTIVE IMPAIRMENTS: Abnormal gait, decreased balance, decreased mobility, difficulty walking, decreased ROM, decreased strength, and impaired flexibility.   ACTIVITY LIMITATIONS: standing, stairs, transfers, and locomotion level  PARTICIPATION LIMITATIONS: cleaning, laundry, community activity, and church  PERSONAL FACTORS: 3+ comorbidities: see PMH above (pt does live in [place] and visits her daughter here in [place] for extended periods of time)  are also affecting patient's functional outcome.   REHAB POTENTIAL: Good  CLINICAL DECISION MAKING:  Evolving/moderate complexity  EVALUATION COMPLEXITY: Moderate  PLAN:  PT FREQUENCY: 2x/week  PT DURATION: 6 weeks plus eval  PLANNED INTERVENTIONS: 97110-Therapeutic exercises, 97530- Therapeutic activity, 97112- Neuromuscular re-education, 97535- Self Care, 16109- Manual therapy, [id number]- Gait training, Patient/Family education, Balance training, Stair training, and DME instructions  PLAN FOR NEXT SESSION:  Initiate HEP for flexibility, lower extremity strengthening, balance, gait training   Angie Bari, PT 07/03/2023, 9:31 AM  [facility] at [facility] [address] [place], [place] [address] Phone # [phone] Fax # [phone]     See Planned Interventions listed in the Plan section of the Evaluation.

## 2023-07-04 ENCOUNTER — Ambulatory Visit (HOSPITAL_BASED_OUTPATIENT_CLINIC_OR_DEPARTMENT_OTHER): Admitting: Internal Medicine

## 2023-07-04 ENCOUNTER — Encounter (HOSPITAL_BASED_OUTPATIENT_CLINIC_OR_DEPARTMENT_OTHER)

## 2023-07-04 DIAGNOSIS — R0609 Other forms of dyspnea: Secondary | ICD-10-CM

## 2023-07-04 LAB — PULMONARY FUNCTION TEST
DL/VA % pred: 130 %
DL/VA: 5.35 ml/min/mmHg/L
DLCO cor % pred: 91 %
DLCO cor: 16.42 ml/min/mmHg
DLCO unc % pred: 85 %
DLCO unc: 15.43 ml/min/mmHg
FEF 25-75 Post: 3.37 L/s
FEF 25-75 Pre: 3.72 L/s
FEF2575-%Change-Post: -9 %
FEF2575-%Pred-Post: 281 %
FEF2575-%Pred-Pre: 310 %
FEV1-%Change-Post: 8 %
FEV1-%Pred-Post: 86 %
FEV1-%Pred-Pre: 79 %
FEV1-Post: 1.51 L
FEV1-Pre: 1.39 L
FEV1FVC-%Change-Post: -8 %
FEV1FVC-%Pred-Pre: 136 %
FEV6-%Change-Post: 18 %
FEV6-%Pred-Post: 74 %
FEV6-%Pred-Pre: 62 %
FEV6-Post: 1.65 L
FEV6-Pre: 1.39 L
FEV6FVC-%Pred-Post: 106 %
FEV6FVC-%Pred-Pre: 106 %
FVC-%Change-Post: 18 %
FVC-%Pred-Post: 70 %
FVC-%Pred-Pre: 59 %
FVC-Post: 1.65 L
FVC-Pre: 1.39 L
Post FEV1/FVC ratio: 91 %
Post FEV6/FVC ratio: 100 %
Pre FEV1/FVC ratio: 100 %
Pre FEV6/FVC Ratio: 100 %
RV % pred: 69 %
RV: 1.67 L
TLC % pred: 73 %
TLC: 3.62 L

## 2023-07-04 NOTE — Patient Instructions (Signed)
 Full PFT Performed Today

## 2023-07-04 NOTE — Progress Notes (Signed)
 Full PFT Performed Today

## 2023-07-06 ENCOUNTER — Ambulatory Visit

## 2023-07-06 DIAGNOSIS — M6281 Muscle weakness (generalized): Secondary | ICD-10-CM

## 2023-07-06 DIAGNOSIS — R2681 Unsteadiness on feet: Secondary | ICD-10-CM | POA: Diagnosis not present

## 2023-07-06 DIAGNOSIS — R2689 Other abnormalities of gait and mobility: Secondary | ICD-10-CM

## 2023-07-06 NOTE — Therapy (Signed)
 OUTPATIENT PHYSICAL THERAPY NEURO TREATMENT   Patient Name: Teresa French MRN: [id number] DOB:[birth year], 83 y.o., female Today's Date: 07/06/2023   PCP: Davida Espy, MD REFERRING PROVIDER: Faustina Hood, MD   END OF SESSION:  PT End of Session - 07/06/23 0939     Visit Number 3    Number of Visits 13    Date for PT Re-Evaluation 08/11/23    Authorization Type [employer]-auth submitted    PT Start Time [phone]   late arrival   PT Stop Time 1015    PT Time Calculation (min) 36 min    Equipment Utilized During Treatment Gait belt    Activity Tolerance Patient tolerated treatment well    Behavior During Therapy WFL for tasks assessed/performed             Past Medical History:  Diagnosis Date   Arthritis    Asthma    Borderline diabetes    Breast mass, left 07/10/2017   06/19/17: mammos: lobulated mass sup & lat to nipple; US : 1.4x0.5x1.0 cm  Bi-RADS4   Complication of anesthesia    "I SLEEP A VERY VERY LONG TIME"   Eczema    Environmental allergies    Glaucoma    History of transfusion    HTN (hypertension), benign 07/10/2017   Hyperlipidemia    Hypertension    Memory loss    Tingling    OF FEET   Past Surgical History:  Procedure Laterality Date   BREAST BIOPSY Bilateral    3-5 EACH BREAST   BREAST CYST ASPIRATION     CERVICAL LAMINECTOMY     DILATION AND CURETTAGE OF UTERUS     GANGLION CYST EXCISION     X3   JOINT REPLACEMENT  2007   left hip   TOTAL HIP ARTHROPLASTY Right 11/21/2014   Procedure: RIGHT TOTAL HIP ARTHROPLASTY ANTERIOR APPROACH;  Surgeon: Arnie Lao, MD;  Location: [facility];  Service: Orthopedics;  Laterality: Right;   Patient Active Problem List   Diagnosis Date Noted   Bronchiectasis (HCC) 10/28/2020   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   COVID-19 09/16/2020   Asthmatic bronchitis 09/08/2020   OSA (obstructive sleep apnea) 09/08/2020   DVT (deep venous thrombosis) (HCC) 02/12/2019   Breast mass,  left 07/10/2017   HTN (hypertension), benign 07/10/2017   Peripheral neuropathy 03/08/2016   Borderline diabetes    Rheumatoid arthritis (HCC) 10/29/2015   DJD (degenerative joint disease) 10/29/2015   Osteoporosis 10/29/2015   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   Osteoarthritis of right hip 11/21/2014   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    ONSET DATE: 06/20/2023  REFERRING DIAG: R26.81 (ICD-10-CM) - Unstable gait   THERAPY DIAG:  Unsteadiness on feet  Other abnormalities of gait and mobility  Muscle weakness (generalized)  Rationale for Evaluation and Treatment: Rehabilitation  SUBJECTIVE:  SUBJECTIVE STATEMENT:  Pt accompanied by: self  PERTINENT HISTORY: Bronchiectasis/ MAIC/ M abscessus), HTN, DVT, CAD, Aortic Atherosclerosis, Peripheral Neuropathy, Rheumatoid Arthritis , Osteoporosis, Hyperlipidemia, Glaucoma, L Breast Mass, Environmental Allergies, Memory Loss, Covid Infection Jan 2022, bilateral THR  PAIN:  Are you having pain? Yes: NPRS scale: 8/10 Pain location: feet Pain description: neuropathy, needles Aggravating factors: worse at night Relieving factors: trying to take the medication  PRECAUTIONS: Fall  RED FLAGS: None   WEIGHT BEARING RESTRICTIONS: No  FALLS: Has patient fallen in last 6 months? Yes. Number of falls 2 One fall was in the bathroom and then another in a parking lot. LIVING ENVIRONMENT: Lives with: lives with their daughter and she lives in [place] and stays with daughter here in [place] Lives in: House/apartment Stairs:  2 story home Has following equipment at home: Single point cane  PLOF: Independent with household mobility with device and Independent with community mobility with  device  PATIENT GOALS: I want to walk again  OBJECTIVE:   TODAY'S TREATMENT: 07/06/23 Activity Comments  Static balance at counter -feet together EO/EC x 30 sec -head turns 3x EC  Sidestepping at counter X 60 sec  Single leg stance x 30 sec Foot elevated in sink  Standing on foam -EO x 30 sec -alt cone taps 2x10 (UE support needed) -weight shifting 1x10  Calf stretch x 60 sec          PATIENT EDUCATION: Education details: Initial eval, POC Person educated: Patient Education method: Explanation Education comprehension: verbalized understanding  HOME EXERCISE PROGRAM: Access Code: [email or web address] URL: [email or web address] Date: 07/06/2023 Prepared by: Shary Decamp  Exercises - Feet Together Balance at Counter Top Eyes Closed  - 1 x daily - 7 x weekly - 1-3 sets - 30 sec hold - Corner Balance Feet Together: Eyes Closed With Head Turns  - 1 x daily - 7 x weekly - 1-3 sets - 3 reps - Semi-Tandem Balance at [employer] Eyes Open  - 1 x daily - 7 x weekly - 1-3 sets - 30 sec hold - Side Stepping with Counter Support  - 1 x daily - 7 x weekly - 1-3 sets - 1-2 min hold   Note: Objective measures were completed at Evaluation unless otherwise noted.  DIAGNOSTIC FINDINGS: NA  COGNITION: Overall cognitive status: Within functional limits for tasks assessed   SENSATION: Light touch: Impaired  and increased sensitivity more distally  POSTURE: rounded shoulders  LOWER EXTREMITY ROM:     Active  Right Eval Left Eval  Hip flexion    Hip extension    Hip abduction    Hip adduction    Hip internal rotation    Hip external rotation    Knee flexion    Knee extension  -15  Ankle dorsiflexion neutral 5  Ankle plantarflexion    Ankle inversion    Ankle eversion     (Blank rows = not tested)  LOWER EXTREMITY MMT:    MMT Right Eval Left Eval  Hip flexion 4 4  Hip extension    Hip abduction    Hip adduction    Hip internal rotation    Hip external rotation     Knee flexion 4 3+  Knee extension 4 3+  Ankle dorsiflexion 4 3+  Ankle plantarflexion    Ankle inversion    Ankle eversion    (Blank rows = not tested)   TRANSFERS: Assistive device utilized:  BUE support   Sit to stand: CGA Stand to sit:  CGA  GAIT: Gait pattern: step through pattern, decreased step length- Right, decreased step length- Left, antalgic, and wide BOS Distance walked: 80 ft Assistive device utilized: Single point cane Level of assistance: CGA Comments: Looks down at the ground  FUNCTIONAL TESTS:  5 times sit to stand: 45.62 sec with BUE support 10 meter walk test: 32.85 sec  (1 ft/sec) Vitals 98%, HR 66 bpm, then after gait:  95%, HR 81   M-CTSIB  Condition 1: Firm Surface, EO 30 Sec, Mild Sway  Condition 2: Firm Surface, EC 30 Sec, Mild Sway  Condition 3: Foam Surface, EO NT Sec,  NT  Sway  Condition 4: Foam Surface, EC NT                                                                                                                                TREATMENT DATE: 06/29/2023     GOALS: Goals reviewed with patient? Yes  SHORT TERM GOALS: Target date: 07/28/2023  Pt will be independent with HEP for improved balance, strength, gait. Baseline: Goal status: INITIAL  2.  Pt will improve 5x sit<>stand to less than or equal to 35 sec to demonstrate improved functional strength and transfer efficiency. Baseline: 45.62 Goal status: INITIAL   LONG TERM GOALS: Target date: 08/11/2023  Pt will be independent with HEP for improved balance, strength, gait. Baseline:  Goal status: INITIAL  2.  Pt will improve 5x sit<>stand to less than or equal to 25 sec to demonstrate improved functional strength and transfer efficiency. Baseline: 45 sec Goal status: INITIAL  3.  Pt will improve Berg score by at least 8 points from baseline, to decrease fall risk. Baseline: 38/56 Goal status: INITIAL  4.  Pt will improve gait velocity to at least 1.8 ft/sec for improved  gait efficiency and safety. Baseline: 1 ft/sec Goal status: INITIAL   ASSESSMENT:  CLINICAL IMPRESSION: Inititaed HEP development with emphasis on static balance and multisensory conditions to address deficits obvious from assessment.  Good tolerance and return demonstration to these initial activities with instructions provided.  Continued with additional challenges to balance with difficulty using compliant surfaces with increased sway evident and requiring UE support when in single limb support.  Continued sessions to progress POC details to reduce fall risk.   OBJECTIVE IMPAIRMENTS: Abnormal gait, decreased balance, decreased mobility, difficulty walking, decreased ROM, decreased strength, and impaired flexibility.   ACTIVITY LIMITATIONS: standing, stairs, transfers, and locomotion level  PARTICIPATION LIMITATIONS: cleaning, laundry, community activity, and church  PERSONAL FACTORS: 3+ comorbidities: see PMH above (pt does live in [place] and visits her daughter here in [place] for extended periods of time)  are also affecting patient's functional outcome.   REHAB POTENTIAL: Good  CLINICAL DECISION MAKING: Evolving/moderate complexity  EVALUATION COMPLEXITY: Moderate  PLAN:  PT FREQUENCY: 2x/week  PT DURATION: 6 weeks plus eval  PLANNED INTERVENTIONS: 97110-Therapeutic exercises, 97530- Therapeutic activity, [id number]- Neuromuscular re-education, 97535- Self Care, [id number]- Manual  therapy, [phone]- Gait training, Patient/Family education, Balance training, Stair training, and DME instructions  PLAN FOR NEXT SESSION:  progress HEP for flexibility, lower extremity strengthening, balance, gait training   Angie Bari, PT 07/06/2023, 9:39 AM  [facility] at [facility] [address] [place], [place] [address] Phone # [phone] Fax # [phone]

## 2023-07-10 ENCOUNTER — Ambulatory Visit: Admitting: Rehabilitative and Restorative Service Providers"

## 2023-07-11 ENCOUNTER — Encounter: Payer: Self-pay | Admitting: Physical Therapy

## 2023-07-11 ENCOUNTER — Ambulatory Visit: Admitting: Physical Therapy

## 2023-07-11 DIAGNOSIS — R2689 Other abnormalities of gait and mobility: Secondary | ICD-10-CM

## 2023-07-11 DIAGNOSIS — M6281 Muscle weakness (generalized): Secondary | ICD-10-CM

## 2023-07-11 DIAGNOSIS — R2681 Unsteadiness on feet: Secondary | ICD-10-CM

## 2023-07-11 NOTE — Therapy (Signed)
 OUTPATIENT PHYSICAL THERAPY NEURO TREATMENT   Patient Name: Teresa French MRN: [id number] DOB:03-27-1940, 83 y.o., female Today's Date: 07/11/2023   PCP: Davida Espy, MD REFERRING PROVIDER: Faustina Hood, MD   END OF SESSION:  PT End of Session - 07/11/23 1428     Visit Number 4    Number of Visits 13    Date for PT Re-Evaluation 08/11/23    Authorization Type [employer] Medicare-auth submitted    Authorization Time Period approved 13 PT visits from 06/29/2023-08/11/2023.    PT Start Time 1400    PT Stop Time 1442    PT Time Calculation (min) 42 min    Equipment Utilized During Treatment Gait belt    Activity Tolerance Patient tolerated treatment well    Behavior During Therapy WFL for tasks assessed/performed              Past Medical History:  Diagnosis Date   Arthritis    Asthma    Borderline diabetes    Breast mass, left 07/10/2017   06/19/17: mammos: lobulated mass sup & lat to nipple; US : 1.4x0.5x1.0 cm  Bi-RADS4   Complication of anesthesia    "I SLEEP A VERY VERY LONG TIME"   Eczema    Environmental allergies    Glaucoma    History of transfusion    HTN (hypertension), benign 07/10/2017   Hyperlipidemia    Hypertension    Memory loss    Tingling    OF FEET   Past Surgical History:  Procedure Laterality Date   BREAST BIOPSY Bilateral    3-5 EACH BREAST   BREAST CYST ASPIRATION     CERVICAL LAMINECTOMY     DILATION AND CURETTAGE OF UTERUS     GANGLION CYST EXCISION     X3   JOINT REPLACEMENT  2007   left hip   TOTAL HIP ARTHROPLASTY Right 11/21/2014   Procedure: RIGHT TOTAL HIP ARTHROPLASTY ANTERIOR APPROACH;  Surgeon: Arnie Lao, MD;  Location: [facility];  Service: Orthopedics;  Laterality: Right;   Patient Active Problem List   Diagnosis Date Noted   Bronchiectasis (HCC) 10/28/2020   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   COVID-19 09/16/2020   Asthmatic bronchitis 09/08/2020   OSA (obstructive sleep apnea) 09/08/2020    DVT (deep venous thrombosis) (HCC) 02/12/2019   Breast mass, left 07/10/2017   HTN (hypertension), benign 07/10/2017   Peripheral neuropathy 03/08/2016   Borderline diabetes    Rheumatoid arthritis (HCC) 10/29/2015   DJD (degenerative joint disease) 10/29/2015   Osteoporosis 10/29/2015   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 I have neuropathy. Just took my inhaler.   Pt accompanied by: self  PERTINENT HISTORY: Bronchiectasis/ MAIC/ M abscessus), HTN, DVT, CAD, Aortic Atherosclerosis, Peripheral Neuropathy, Rheumatoid Arthritis , Osteoporosis, Hyperlipidemia, Glaucoma, L Breast Mass, Environmental Allergies, Memory Loss, Covid Infection Jan 2022, bilateral THR  PAIN:  Are you having pain? Yes: NPRS scale: moderate pain/10 Pain location: knees and legs  Pain description: neuropathy, needles Aggravating factors: worse at night Relieving factors: trying to take the medication  PRECAUTIONS: Fall  RED FLAGS: None   WEIGHT BEARING RESTRICTIONS: No  FALLS: Has patient fallen in last 6 months? Yes. Number of falls 2 One fall was in the bathroom and then another in a parking lot. LIVING ENVIRONMENT: Lives with: lives with their daughter and she lives in [place] and stays with daughter here in [place] Lives in: House/apartment Stairs:  2 story home Has following  equipment at home: Single point cane  PLOF: Independent with household mobility with device and Independent with community mobility with device  PATIENT GOALS: I want to walk again  OBJECTIVE:     TODAY'S TREATMENT: 07/11/23 Activity Comments  Vitals at start of session  97% spO2, 70bpm, 139/64 mmHg   STS from elevated seat with hands pushing off knees 5x  Knee crepitus; required B UE support on mat d/t weakness without UEs. Slow and limited used of moment. Cueing for tall upright posture   Sitting with 2# LAQ 10x each  Slow but good form  Sitting HS curl with red TB loop 10x each  Good control. Reports L knee pain after rep 8, thus discontinued  97% spO2, 77bpm  sititng HS stretch 30" each  Good form  alt side step B UE support on counter; cues for posture and shorter step d/t difficulty  alt backwards step B UE support on counter; cues for posture and shorter step d/t difficulty  Vitals upon leaving 97% 79bpm     HOME EXERCISE PROGRAM Last updated: 07/11/23 Access Code: [facility] URL: [email or web address] Date: 07/11/2023 Prepared by: [facility] - Outpatient  Rehab - [facility]  Program Notes stop if pain occurs  Exercises - Feet Together Balance at Counter Top Eyes Closed  - 1 x daily - 7 x weekly - 1-3 sets - 30 sec hold - Corner Balance Feet Together: Eyes Closed With Head Turns  - 1 x daily - 7 x weekly - 1-3 sets - 3 reps - Semi-Tandem Balance at [employer] Eyes Open  - 1 x daily - 7 x weekly - 1-3 sets - 30 sec hold - Side Stepping with Counter Support  - 1 x daily - 7 x weekly - 1-3 sets - 1-2 min hold - Sit to Stand with Counter Support  - 1 x daily - 5 x weekly - 2 sets - 5 reps - Seated Long Arc Quad  - 1 x daily - 5 x weekly - 1-2 sets - 10 reps - Seated Hamstring Curls with Resistance  - 1 x daily - 5 x weekly - 1-2 sets - 10 reps     PATIENT EDUCATION: Education details: HEP update with edu for safety; edu on LE strengthening and its  effect on joints Person educated: Patient Education method: Explanation, Demonstration, Tactile cues, Verbal cues, and Handouts Education comprehension: verbalized understanding and returned demonstration     Note: Objective measures were completed at Evaluation unless otherwise noted.  DIAGNOSTIC FINDINGS: NA  COGNITION: Overall cognitive status: Within functional limits  for tasks assessed   SENSATION: Light touch: Impaired  and increased sensitivity more distally  POSTURE: rounded shoulders  LOWER EXTREMITY ROM:     Active  Right Eval Left Eval  Hip flexion    Hip extension    Hip abduction    Hip adduction    Hip internal rotation    Hip external rotation    Knee flexion    Knee extension  -15  Ankle dorsiflexion neutral 5  Ankle plantarflexion    Ankle inversion    Ankle eversion     (Blank rows = not tested)  LOWER EXTREMITY MMT:    MMT Right Eval Left Eval  Hip flexion 4 4  Hip extension    Hip abduction    Hip adduction    Hip internal rotation    Hip external rotation    Knee flexion 4 3+  Knee extension 4 3+  Ankle dorsiflexion 4 3+  Ankle plantarflexion    Ankle inversion    Ankle eversion    (Blank rows = not tested)   TRANSFERS: Assistive device utilized:  BUE support   Sit to stand: CGA Stand to sit: CGA  GAIT: Gait pattern: step through pattern, decreased step length- Right, decreased step length- Left, antalgic, and wide BOS Distance walked: 80 ft Assistive device utilized: Single point cane Level of assistance: CGA Comments: Looks down at the ground  FUNCTIONAL TESTS:  5 times sit to stand: 45.62 sec with BUE support 10 meter walk test: 32.85 sec  (1 ft/sec) Vitals 98%, HR 66 bpm, then after gait:  95%, HR 81   M-CTSIB  Condition 1: Firm Surface, EO 30 Sec, Mild Sway  Condition 2: Firm Surface, EC 30 Sec, Mild Sway  Condition 3: Foam Surface, EO NT Sec,  NT  Sway  Condition 4: Foam Surface, EC NT                                                                                                                                 TREATMENT DATE: 06/29/2023     GOALS: Goals reviewed with patient? Yes  SHORT TERM GOALS: Target date: 07/28/2023  Pt will be independent with HEP for improved balance, strength, gait. Baseline: Goal status: INITIAL  2.  Pt will improve 5x sit<>stand to less than or equal to 35 sec to demonstrate improved functional strength and transfer efficiency. Baseline: 45.62 Goal status: INITIAL   LONG TERM GOALS: Target date: 08/11/2023  Pt will be independent with HEP for improved balance, strength, gait. Baseline:  Goal status: INITIAL  2.  Pt will improve 5x sit<>stand to less than or equal to 25 sec to demonstrate improved functional strength and transfer efficiency. Baseline: 45 sec Goal status: INITIAL  3.  Pt will improve Berg score by at least 8 points from baseline, to decrease fall risk. Baseline: 38/56 Goal status: INITIAL  4.  Pt will improve gait velocity to at least  1.8 ft/sec for improved gait efficiency and safety. Baseline: 1 ft/sec Goal status: INITIAL   ASSESSMENT:  CLINICAL IMPRESSION: Patient slow-moving upon coming into appointment and appeared breathless. Appears that she recently had a pulmonology appointment for DOE. Vitals at start of session were WNL, thus proceeded with session while monitoring symptoms and vitals . STS very challenging for patient even at elevated height. Improved success when allowing B UE use. HEP was updated with functional and seated strengthening to perform at home. Patient performed exercises slowly but with good form. Patient not very revealing on her tolerance for activities in today's session, thus required increased questioning to assess her tolerance for session. She reported no complaints upon leaving and declined escort out to car for safety.   OBJECTIVE IMPAIRMENTS: Abnormal gait, decreased balance, decreased mobility,  difficulty walking, decreased ROM, decreased strength, and impaired flexibility.   ACTIVITY LIMITATIONS: standing, stairs, transfers, and locomotion level  PARTICIPATION LIMITATIONS: cleaning, laundry, community activity, and church  PERSONAL FACTORS: 3+ comorbidities: see PMH above (pt does live in [place] and visits her daughter here in [place] for extended periods of time)  are also affecting patient's functional outcome.   REHAB POTENTIAL: Good  CLINICAL DECISION MAKING: Evolving/moderate complexity  EVALUATION COMPLEXITY: Moderate  PLAN:  PT FREQUENCY: 2x/week  PT DURATION: 6 weeks plus eval  PLANNED INTERVENTIONS: 97110-Therapeutic exercises, 97530- Therapeutic activity, 97112- Neuromuscular re-education, 97535- Self Care, 78295- Manual therapy, [phone]- Gait training, Patient/Family education, Balance training, Stair training, and DME instructions  PLAN FOR NEXT SESSION:  progress HEP for flexibility, lower extremity strengthening, balance, gait training      Thaddeus Filippo, PT, DPT 07/11/23 2:44 PM  [facility] [address], [address] [place], [place] [address] Phone # [phone] Fax # [phone]

## 2023-07-13 ENCOUNTER — Ambulatory Visit

## 2023-07-13 DIAGNOSIS — R2681 Unsteadiness on feet: Secondary | ICD-10-CM

## 2023-07-13 DIAGNOSIS — M6281 Muscle weakness (generalized): Secondary | ICD-10-CM

## 2023-07-13 DIAGNOSIS — R2689 Other abnormalities of gait and mobility: Secondary | ICD-10-CM

## 2023-07-13 NOTE — Therapy (Signed)
 OUTPATIENT PHYSICAL THERAPY NEURO TREATMENT   Patient Name: Teresa French MRN: [id number] DOB:1940/09/05, 83 y.o., female Today's Date: 07/13/2023   PCP: Davida Espy, MD REFERRING PROVIDER: Faustina Hood, MD   END OF SESSION:  PT End of Session - 07/13/23 1016     Visit Number 5    Number of Visits 13    Date for PT Re-Evaluation 08/11/23    Authorization Type [employer] Medicare-auth submitted    Authorization Time Period approved 13 PT visits from 06/29/2023-08/11/2023.    PT Start Time 1015    PT Stop Time 1100    PT Time Calculation (min) 45 min    Equipment Utilized During Treatment Gait belt    Activity Tolerance Patient tolerated treatment well    Behavior During Therapy WFL for tasks assessed/performed              Past Medical History:  Diagnosis Date   Arthritis    Asthma    Borderline diabetes    Breast mass, left 07/10/2017   06/19/17: mammos: lobulated mass sup & lat to nipple; US : 1.4x0.5x1.0 cm  Bi-RADS4   Complication of anesthesia    "I SLEEP A VERY VERY LONG TIME"   Eczema    Environmental allergies    Glaucoma    History of transfusion    HTN (hypertension), benign 07/10/2017   Hyperlipidemia    Hypertension    Memory loss    Tingling    OF FEET   Past Surgical History:  Procedure Laterality Date   BREAST BIOPSY Bilateral    3-5 EACH BREAST   BREAST CYST ASPIRATION     CERVICAL LAMINECTOMY     DILATION AND CURETTAGE OF UTERUS     GANGLION CYST EXCISION     X3   JOINT REPLACEMENT  2007   left hip   TOTAL HIP ARTHROPLASTY Right 11/21/2014   Procedure: RIGHT TOTAL HIP ARTHROPLASTY ANTERIOR APPROACH;  Surgeon: Arnie Lao, MD;  Location: [facility];  Service: Orthopedics;  Laterality: Right;   Patient Active Problem List   Diagnosis Date Noted   Bronchiectasis (HCC) 10/28/2020   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   COVID-19 09/16/2020   Asthmatic bronchitis 09/08/2020   OSA (obstructive sleep apnea) 09/08/2020    DVT (deep venous thrombosis) (HCC) 02/12/2019   Breast mass, left 07/10/2017   HTN (hypertension), benign 07/10/2017   Peripheral neuropathy 03/08/2016   Borderline diabetes    Rheumatoid arthritis (HCC) 10/29/2015   DJD (degenerative joint disease) 10/29/2015   Osteoporosis 10/29/2015   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   Osteoarthritis of right hip 11/21/2014   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    ONSET DATE: 06/20/2023  REFERRING DIAG: R26.81 (ICD-10-CM) - Unstable gait   THERAPY DIAG:  Unsteadiness on feet  Other abnormalities of gait and mobility  Muscle weakness (generalized)  Rationale for Evaluation and Treatment: Rehabilitation  SUBJECTIVE:  SUBJECTIVE STATEMENT: Noted that the knee extension exercise aggravated the low back  Pt accompanied by: self  PERTINENT HISTORY: Bronchiectasis/ MAIC/ M abscessus), HTN, DVT, CAD, Aortic Atherosclerosis, Peripheral Neuropathy, Rheumatoid Arthritis , Osteoporosis, Hyperlipidemia, Glaucoma, L Breast Mass, Environmental Allergies, Memory Loss, Covid Infection Jan 2022, bilateral THR  PAIN:  Are you having pain? Yes: NPRS scale: moderate pain/10 Pain location: knees and legs  Pain description: neuropathy, needles Aggravating factors: worse at night Relieving factors: trying to take the medication  PRECAUTIONS: Fall  RED FLAGS: None   WEIGHT BEARING RESTRICTIONS: No  FALLS: Has patient fallen in last 6 months? Yes. Number of falls 2 One fall was in the bathroom and then another in a parking lot. LIVING ENVIRONMENT: Lives with: lives with their daughter and she lives in [place] and stays with daughter here in [place] Lives in: House/apartment Stairs:  2 story home Has following  equipment at home: Single point cane  PLOF: Independent with household mobility with device and Independent with community mobility with device  PATIENT GOALS: I want to walk again  OBJECTIVE:   TODAY'S TREATMENT: 07/13/23 Activity Comments  Pt education regarding cardiovascular exercise and benefits regarding peripheral neuropathy   NU-step speed intervals x 8 min 2 min warm-up at level 4 30 sec speed 80 SPM; 60 sec recovery 60-70 SPM at resistance 2 97%, 70 bpm, 3/10 RPE  Static balance Feet together EO/EC x 30 sec Feet together EO + head turns Semi tandem x 30 sec -repeated on foam  Gastroc stretch 1x 60 sec For HEP  Seated hamstring stretch 1x30 sec For HEP         TODAY'S TREATMENT: 07/11/23 Activity Comments  Vitals at start of session  97% spO2, 70bpm, 139/64 mmHg   STS from elevated seat with hands pushing off knees 5x  Knee crepitus; required B UE support on mat d/t weakness without UEs. Slow and limited used of moment. Cueing for tall upright posture   Sitting with 2# LAQ 10x each  Slow but good form  Sitting HS curl with red TB loop 10x each  Good control. Reports L knee pain after rep 8, thus discontinued  97% spO2, 77bpm  sititng HS stretch 30" each  Good form  alt side step B UE support on counter; cues for posture and shorter step d/t difficulty  alt backwards step B UE support on counter; cues for posture and shorter step d/t difficulty  Vitals upon leaving 97% 79bpm     HOME EXERCISE PROGRAM Last updated: 07/11/23 Access Code: [facility] URL: [email or web address] Date: 07/11/2023 Prepared by: [facility] - Outpatient  Rehab - [facility]  Program Notes stop if pain occurs  Exercises - Feet Together Balance at Counter Top Eyes Closed  - 1 x daily - 7 x weekly - 1-3 sets - 30 sec hold - Corner Balance Feet Together: Eyes Closed With Head Turns  - 1 x daily - 7 x weekly - 1-3 sets - 3 reps - Semi-Tandem Balance at [employer] Eyes Open  -  1 x daily - 7 x weekly - 1-3 sets - 30 sec hold - Side Stepping with Counter Support  - 1 x daily - 7 x weekly - 1-3 sets - 1-2 min hold - Sit to Stand with Counter Support  - 1 x daily - 5 x weekly - 2 sets - 5 reps - Seated Long Arc Quad  - 1 x daily - 5 x weekly - 1-2 sets - 10 reps -  Seated Hamstring Curls with Resistance  - 1 x daily - 5 x weekly - 1-2 sets - 10 reps - Standing Gastroc Stretch at Counter  - 1 x daily - 7 x weekly - 3 sets - 60 sec hold - Seated Hamstring Stretch  - 1 x daily - 7 x weekly - 3 sets - 30 sec hold     PATIENT EDUCATION: Education details: HEP update with edu for safety; edu on LE strengthening and its effect on joints Person educated: Patient Education method: Explanation, Demonstration, Tactile cues, Verbal cues, and Handouts Education comprehension: verbalized understanding and returned demonstration     Note: Objective measures were completed at Evaluation unless otherwise noted.  DIAGNOSTIC FINDINGS: NA  COGNITION: Overall cognitive status: Within functional limits for tasks assessed   SENSATION: Light touch: Impaired  and increased sensitivity more distally  POSTURE: rounded shoulders  LOWER EXTREMITY ROM:     Active  Right Eval Left Eval  Hip flexion    Hip extension    Hip abduction    Hip adduction    Hip internal rotation    Hip external rotation    Knee flexion    Knee extension  -15  Ankle dorsiflexion neutral 5  Ankle plantarflexion    Ankle inversion    Ankle eversion     (Blank rows = not tested)  LOWER EXTREMITY MMT:    MMT Right Eval Left Eval  Hip flexion 4 4  Hip extension    Hip abduction    Hip adduction    Hip internal rotation    Hip external rotation    Knee flexion 4 3+  Knee extension 4 3+  Ankle dorsiflexion 4 3+  Ankle plantarflexion    Ankle inversion    Ankle eversion    (Blank rows = not tested)   TRANSFERS: Assistive device utilized:  BUE support   Sit to stand: CGA Stand to sit:  CGA  GAIT: Gait pattern: step through pattern, decreased step length- Right, decreased step length- Left, antalgic, and wide BOS Distance walked: 80 ft Assistive device utilized: Single point cane Level of assistance: CGA Comments: Looks down at the ground  FUNCTIONAL TESTS:  5 times sit to stand: 45.62 sec with BUE support 10 meter walk test: 32.85 sec  (1 ft/sec) Vitals 98%, HR 66 bpm, then after gait:  95%, HR 81   M-CTSIB  Condition 1: Firm Surface, EO 30 Sec, Mild Sway  Condition 2: Firm Surface, EC 30 Sec, Mild Sway  Condition 3: Foam Surface, EO NT Sec,  NT  Sway  Condition 4: Foam Surface, EC NT                                                                                                                                TREATMENT DATE: 06/29/2023     GOALS: Goals reviewed with patient? Yes  SHORT TERM GOALS: Target date: 07/28/2023  Pt will be  independent with HEP for improved balance, strength, gait. Baseline: Goal status: INITIAL  2.  Pt will improve 5x sit<>stand to less than or equal to 35 sec to demonstrate improved functional strength and transfer efficiency. Baseline: 45.62 Goal status: INITIAL   LONG TERM GOALS: Target date: 08/11/2023  Pt will be independent with HEP for improved balance, strength, gait. Baseline:  Goal status: INITIAL  2.  Pt will improve 5x sit<>stand to less than or equal to 25 sec to demonstrate improved functional strength and transfer efficiency. Baseline: 45 sec Goal status: INITIAL  3.  Pt will improve Berg score by at least 8 points from baseline, to decrease fall risk. Baseline: 38/56 Goal status: INITIAL  4.  Pt will improve gait velocity to at least 1.8 ft/sec for improved gait efficiency and safety. Baseline: 1 ft/sec Goal status: INITIAL   ASSESSMENT:  CLINICAL IMPRESSION: Instructed in benefits of cardiovascular exercise as it pertains to peripheral neuropathy and instructed in NU-step interval training at 1:2  ratio in order to replicate strategy at home for improved activity participation/understanding.  Instructed in multisensory balance activities using HEP activities first on firm surface and then repeated on compliant surface requiring UE support 75% of the time on compliant surfaces.  Ended session with static stretching to implement to HEP for improved ROM/flexibility as well as means of desensitization. Continued sessions to progress POC details to improve mobility and reduce risk for falls  OBJECTIVE IMPAIRMENTS: Abnormal gait, decreased balance, decreased mobility, difficulty walking, decreased ROM, decreased strength, and impaired flexibility.   ACTIVITY LIMITATIONS: standing, stairs, transfers, and locomotion level  PARTICIPATION LIMITATIONS: cleaning, laundry, community activity, and church  PERSONAL FACTORS: 3+ comorbidities: see PMH above (pt does live in [place] and visits her daughter here in [place] for extended periods of time)  are also affecting patient's functional outcome.   REHAB POTENTIAL: Good  CLINICAL DECISION MAKING: Evolving/moderate complexity  EVALUATION COMPLEXITY: Moderate  PLAN:  PT FREQUENCY: 2x/week  PT DURATION: 6 weeks plus eval  PLANNED INTERVENTIONS: 97110-Therapeutic exercises, 97530- Therapeutic activity, 97112- Neuromuscular re-education, 97535- Self Care, 78295- Manual therapy, [phone]- Gait training, Patient/Family education, Balance training, Stair training, and DME instructions  PLAN FOR NEXT SESSION:  progress HEP for flexibility, lower extremity strengthening, balance, gait training    11:58 AM, 07/13/23 M. Kelly Hartman Minahan, PT, DPT Physical Therapist- [place] Office Number: [phone]

## 2023-07-18 ENCOUNTER — Ambulatory Visit

## 2023-07-18 DIAGNOSIS — R2681 Unsteadiness on feet: Secondary | ICD-10-CM

## 2023-07-18 DIAGNOSIS — M6281 Muscle weakness (generalized): Secondary | ICD-10-CM

## 2023-07-18 DIAGNOSIS — R2689 Other abnormalities of gait and mobility: Secondary | ICD-10-CM

## 2023-07-18 NOTE — Therapy (Signed)
 OUTPATIENT PHYSICAL THERAPY NEURO TREATMENT   Patient Name: Teresa French MRN: [id number] DOB:11-09-1940, 83 y.o., female Today's Date: 07/18/2023   PCP: Davida Espy, MD REFERRING PROVIDER: Faustina Hood, MD   END OF SESSION:  PT End of Session - 07/18/23 1016     Visit Number 6    Number of Visits 13    Date for PT Re-Evaluation 08/11/23    Authorization Type [employer]-auth submitted    Authorization Time Period approved 13 PT visits from 06/29/2023-08/11/2023.    PT Start Time 1015    PT Stop Time 1100    PT Time Calculation (min) 45 min    Equipment Utilized During Treatment Gait belt    Activity Tolerance Patient tolerated treatment well    Behavior During Therapy WFL for tasks assessed/performed              Past Medical History:  Diagnosis Date   Arthritis    Asthma    Borderline diabetes    Breast mass, left 07/10/2017   06/19/17: mammos: lobulated mass sup & lat to nipple; US : 1.4x0.5x1.0 cm  Bi-RADS4   Complication of anesthesia    "I SLEEP A VERY VERY LONG TIME"   Eczema    Environmental allergies    Glaucoma    History of transfusion    HTN (hypertension), benign 07/10/2017   Hyperlipidemia    Hypertension    Memory loss    Tingling    OF FEET   Past Surgical History:  Procedure Laterality Date   BREAST BIOPSY Bilateral    3-5 EACH BREAST   BREAST CYST ASPIRATION     CERVICAL LAMINECTOMY     DILATION AND CURETTAGE OF UTERUS     GANGLION CYST EXCISION     X3   JOINT REPLACEMENT  2007   left hip   TOTAL HIP ARTHROPLASTY Right 11/21/2014   Procedure: RIGHT TOTAL HIP ARTHROPLASTY ANTERIOR APPROACH;  Surgeon: Arnie Lao, MD;  Location: [facility];  Service: Orthopedics;  Laterality: Right;   Patient Active Problem List   Diagnosis Date Noted   Bronchiectasis (HCC) 10/28/2020   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   COVID-19 09/16/2020   Asthmatic bronchitis 09/08/2020   OSA (obstructive sleep apnea) 09/08/2020    DVT (deep venous thrombosis) (HCC) 02/12/2019   Breast mass, left 07/10/2017   HTN (hypertension), benign 07/10/2017   Peripheral neuropathy 03/08/2016   Borderline diabetes    Rheumatoid arthritis (HCC) 10/29/2015   DJD (degenerative joint disease) 10/29/2015   Osteoporosis 10/29/2015   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   Osteoarthritis of right hip 11/21/2014   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    ONSET DATE: 06/20/2023  REFERRING DIAG: R26.81 (ICD-10-CM) - Unstable gait   THERAPY DIAG:  Unsteadiness on feet  Other abnormalities of gait and mobility  Muscle weakness (generalized)  Rationale for Evaluation and Treatment: Rehabilitation  SUBJECTIVE:  SUBJECTIVE STATEMENT: The main discomfort from neuropathy comes on during the day with increased activity or in the middle of the night  Pt accompanied by: self  PERTINENT HISTORY: Bronchiectasis/ MAIC/ M abscessus), HTN, DVT, CAD, Aortic Atherosclerosis, Peripheral Neuropathy, Rheumatoid Arthritis , Osteoporosis, Hyperlipidemia, Glaucoma, L Breast Mass, Environmental Allergies, Memory Loss, Covid Infection Jan 2022, bilateral THR  PAIN:  Are you having pain? Yes: NPRS scale: moderate pain/10 Pain location: knees and legs  Pain description: neuropathy, needles Aggravating factors: worse at night Relieving factors: trying to take the medication  PRECAUTIONS: Fall  RED FLAGS: None   WEIGHT BEARING RESTRICTIONS: No  FALLS: Has patient fallen in last 6 months? Yes. Number of falls 2 One fall was in the bathroom and then another in a parking lot. LIVING ENVIRONMENT: Lives with: lives with their daughter and she lives in [place] and stays with daughter here in [place] Lives in:  House/apartment Stairs:  2 story home Has following equipment at home: Single point cane  PLOF: Independent with household mobility with device and Independent with community mobility with device  PATIENT GOALS: I want to walk again  OBJECTIVE:   TODAY'S TREATMENT: 07/18/23 Activity Comments  Education regarding whole body vibration for neuropathic pain   Seated LE AROM 2x10   STS 1x5 -Elevated seat -on airex Pronounced knee crepitus  Standing balance on foam -feet together EO/EC x 60 sec -head turns 3x EC  Forward tandem/backward walk  X 2 min along counter  Standing on foam -lateral and ant-post sway EO/EC x 60 sec  Gastroc stretch 2x60 sec On slantboard     TODAY'S TREATMENT: 07/13/23 Activity Comments  Pt education regarding cardiovascular exercise and benefits regarding peripheral neuropathy   NU-step speed intervals x 8 min 2 min warm-up at level 4 30 sec speed 80 SPM; 60 sec recovery 60-70 SPM at resistance 2 97%, 70 bpm, 3/10 RPE  Static balance Feet together EO/EC x 30 sec Feet together EO + head turns Semi tandem x 30 sec -repeated on foam  Gastroc stretch 1x 60 sec For HEP  Seated hamstring stretch 1x30 sec For HEP         TODAY'S TREATMENT: 07/11/23 Activity Comments  Vitals at start of session  97% spO2, 70bpm, 139/64 mmHg   STS from elevated seat with hands pushing off knees 5x  Knee crepitus; required B UE support on mat d/t weakness without UEs. Slow and limited used of moment. Cueing for tall upright posture   Sitting with 2# LAQ 10x each  Slow but good form  Sitting HS curl with red TB loop 10x each  Good control. Reports L knee pain after rep 8, thus discontinued  97% spO2, 77bpm  sititng HS stretch 30" each  Good form  alt side step B UE support on counter; cues for posture and shorter step d/t difficulty  alt backwards step B UE support on counter; cues for posture and shorter step d/t difficulty  Vitals upon leaving 97% 79bpm     HOME EXERCISE  PROGRAM Last updated: 07/11/23 Access Code: [facility] URL: [email or web address] Date: 07/11/2023 Prepared by: [facility] - Outpatient  Rehab - [facility]  Program Notes stop if pain occurs  Exercises - Feet Together Balance at Counter Top Eyes Closed  - 1 x daily - 7 x weekly - 1-3 sets - 30 sec hold - Corner Balance Feet Together: Eyes Closed With Head Turns  - 1 x daily - 7 x weekly - 1-3 sets - 3  reps - Semi-Tandem Balance at [employer] Open  - 1 x daily - 7 x weekly - 1-3 sets - 30 sec hold - Side Stepping with Counter Support  - 1 x daily - 7 x weekly - 1-3 sets - 1-2 min hold - Sit to Stand with Counter Support  - 1 x daily - 5 x weekly - 2 sets - 5 reps - Seated Long Arc Quad  - 1 x daily - 5 x weekly - 1-2 sets - 10 reps - Seated Hamstring Curls with Resistance  - 1 x daily - 5 x weekly - 1-2 sets - 10 reps - Standing Gastroc Stretch at Counter  - 1 x daily - 7 x weekly - 3 sets - 60 sec hold - Seated Hamstring Stretch  - 1 x daily - 7 x weekly - 3 sets - 30 sec hold     PATIENT EDUCATION: Education details: HEP update with edu for safety; edu on LE strengthening and its effect on joints Person educated: Patient Education method: Explanation, Demonstration, Tactile cues, Verbal cues, and Handouts Education comprehension: verbalized understanding and returned demonstration     Note: Objective measures were completed at Evaluation unless otherwise noted.  DIAGNOSTIC FINDINGS: NA  COGNITION: Overall cognitive status: Within functional limits for tasks assessed   SENSATION: Light touch: Impaired  and increased sensitivity more distally  POSTURE: rounded shoulders  LOWER EXTREMITY ROM:     Active  Right Eval Left Eval  Hip flexion    Hip extension    Hip abduction    Hip adduction    Hip internal rotation    Hip external rotation    Knee flexion    Knee extension  -15  Ankle dorsiflexion neutral 5  Ankle plantarflexion    Ankle  inversion    Ankle eversion     (Blank rows = not tested)  LOWER EXTREMITY MMT:    MMT Right Eval Left Eval  Hip flexion 4 4  Hip extension    Hip abduction    Hip adduction    Hip internal rotation    Hip external rotation    Knee flexion 4 3+  Knee extension 4 3+  Ankle dorsiflexion 4 3+  Ankle plantarflexion    Ankle inversion    Ankle eversion    (Blank rows = not tested)   TRANSFERS: Assistive device utilized:  BUE support   Sit to stand: CGA Stand to sit: CGA  GAIT: Gait pattern: step through pattern, decreased step length- Right, decreased step length- Left, antalgic, and wide BOS Distance walked: 80 ft Assistive device utilized: Single point cane Level of assistance: CGA Comments: Looks down at the ground  FUNCTIONAL TESTS:  5 times sit to stand: 45.62 sec with BUE support 10 meter walk test: 32.85 sec  (1 ft/sec) Vitals 98%, HR 66 bpm, then after gait:  95%, HR 81   M-CTSIB  Condition 1: Firm Surface, EO 30 Sec, Mild Sway  Condition 2: Firm Surface, EC 30 Sec, Mild Sway  Condition 3: Foam Surface, EO NT Sec,  NT  Sway  Condition 4: Foam Surface, EC NT  TREATMENT DATE: 06/29/2023     GOALS: Goals reviewed with patient? Yes  SHORT TERM GOALS: Target date: 07/28/2023  Pt will be independent with HEP for improved balance, strength, gait. Baseline: Goal status: INITIAL  2.  Pt will improve 5x sit<>stand to less than or equal to 35 sec to demonstrate improved functional strength and transfer efficiency. Baseline: 45.62 Goal status: INITIAL   LONG TERM GOALS: Target date: 08/11/2023  Pt will be independent with HEP for improved balance, strength, gait. Baseline:  Goal status: INITIAL  2.  Pt will improve 5x sit<>stand to less than or equal to 25 sec to demonstrate improved functional strength and transfer  efficiency. Baseline: 45 sec Goal status: INITIAL  3.  Pt will improve Berg score by at least 8 points from baseline, to decrease fall risk. Baseline: 38/56 Goal status: INITIAL  4.  Pt will improve gait velocity to at least 1.8 ft/sec for improved gait efficiency and safety. Baseline: 1 ft/sec Goal status: INITIAL   ASSESSMENT:  CLINICAL IMPRESSION: Continued with training to improve mobility and sit to stand from modified position due to knee pain/crepitus with increased difficulty on compliant surfaces with tendency for retropulsion.  Multisensory static balance activities to improve postural stability and limits of stability. Dynamic balance for improved single limb support and loading response and comfort with narrow BOS. Continued sessions to progress POC details to improve mobility and activity tolerance.   OBJECTIVE IMPAIRMENTS: Abnormal gait, decreased balance, decreased mobility, difficulty walking, decreased ROM, decreased strength, and impaired flexibility.   ACTIVITY LIMITATIONS: standing, stairs, transfers, and locomotion level  PARTICIPATION LIMITATIONS: cleaning, laundry, community activity, and church  PERSONAL FACTORS: 3+ comorbidities: see PMH above (pt does live in [place] and visits her daughter here in [place] for extended periods of time)  are also affecting patient's functional outcome.   REHAB POTENTIAL: Good  CLINICAL DECISION MAKING: Evolving/moderate complexity  EVALUATION COMPLEXITY: Moderate  PLAN:  PT FREQUENCY: 2x/week  PT DURATION: 6 weeks plus eval  PLANNED INTERVENTIONS: 97110-Therapeutic exercises, 97530- Therapeutic activity, [id number]- Neuromuscular re-education, 97535- Self Care, 40981- Manual therapy, [phone]- Gait training, Patient/Family education, Balance training, Stair training, and DME instructions  PLAN FOR NEXT SESSION:  progress HEP for flexibility, lower extremity strengthening, balance, gait training    10:18 AM,  07/18/23 M. Kelly Densel Kronick, PT, DPT Physical Therapist- [place] Office Number: [phone]

## 2023-07-20 ENCOUNTER — Ambulatory Visit: Attending: Internal Medicine

## 2023-07-20 DIAGNOSIS — R2689 Other abnormalities of gait and mobility: Secondary | ICD-10-CM | POA: Diagnosis present

## 2023-07-20 DIAGNOSIS — M6281 Muscle weakness (generalized): Secondary | ICD-10-CM | POA: Insufficient documentation

## 2023-07-20 DIAGNOSIS — R2681 Unsteadiness on feet: Secondary | ICD-10-CM | POA: Diagnosis present

## 2023-07-20 NOTE — Therapy (Signed)
 OUTPATIENT PHYSICAL THERAPY NEURO TREATMENT   Patient Name: Teresa French MRN: [id number] DOB:03-08-1941, 83 y.o., female Today's Date: 07/20/2023   PCP: Davida Espy, MD REFERRING PROVIDER: Faustina Hood, MD   END OF SESSION:  PT End of Session - 07/20/23 1019     Visit Number 7    Number of Visits 13    Date for PT Re-Evaluation 08/11/23    Authorization Type [employer] Medicare-auth submitted    Authorization Time Period approved 13 PT visits from 06/29/2023-08/11/2023.    PT Start Time 1018    PT Stop Time 1103    PT Time Calculation (min) 45 min    Equipment Utilized During Treatment Gait belt    Activity Tolerance Patient tolerated treatment well    Behavior During Therapy WFL for tasks assessed/performed              Past Medical History:  Diagnosis Date   Arthritis    Asthma    Borderline diabetes    Breast mass, left 07/10/2017   06/19/17: mammos: lobulated mass sup & lat to nipple; US : 1.4x0.5x1.0 cm  Bi-RADS4   Complication of anesthesia    "I SLEEP A VERY VERY LONG TIME"   Eczema    Environmental allergies    Glaucoma    History of transfusion    HTN (hypertension), benign 07/10/2017   Hyperlipidemia    Hypertension    Memory loss    Tingling    OF FEET   Past Surgical History:  Procedure Laterality Date   BREAST BIOPSY Bilateral    3-5 EACH BREAST   BREAST CYST ASPIRATION     CERVICAL LAMINECTOMY     DILATION AND CURETTAGE OF UTERUS     GANGLION CYST EXCISION     X3   JOINT REPLACEMENT  2007   left hip   TOTAL HIP ARTHROPLASTY Right 11/21/2014   Procedure: RIGHT TOTAL HIP ARTHROPLASTY ANTERIOR APPROACH;  Surgeon: Arnie Lao, MD;  Location: [facility];  Service: Orthopedics;  Laterality: Right;   Patient Active Problem List   Diagnosis Date Noted   Bronchiectasis (HCC) 10/28/2020   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   COVID-19 09/16/2020   Asthmatic bronchitis 09/08/2020   OSA (obstructive sleep apnea) 09/08/2020    DVT (deep venous thrombosis) (HCC) 02/12/2019   Breast mass, left 07/10/2017   HTN (hypertension), benign 07/10/2017   Peripheral neuropathy 03/08/2016   Borderline diabetes    Rheumatoid arthritis (HCC) 10/29/2015   DJD (degenerative joint disease) 10/29/2015   Osteoporosis 10/29/2015   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   Osteoarthritis of right hip 11/21/2014   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    ONSET DATE: 06/20/2023  REFERRING DIAG: R26.81 (ICD-10-CM) - Unstable gait   THERAPY DIAG:  Unsteadiness on feet  Other abnormalities of gait and mobility  Muscle weakness (generalized)  Rationale for Evaluation and Treatment: Rehabilitation  SUBJECTIVE:  SUBJECTIVE STATEMENT: Feeling more stiff today, the neuropathy was bad last night awakening and requiring medication  Pt accompanied by: self  PERTINENT HISTORY: Bronchiectasis/ MAIC/ M abscessus), HTN, DVT, CAD, Aortic Atherosclerosis, Peripheral Neuropathy, Rheumatoid Arthritis , Osteoporosis, Hyperlipidemia, Glaucoma, L Breast Mass, Environmental Allergies, Memory Loss, Covid Infection Jan 2022, bilateral THR  PAIN:  Are you having pain? Yes: NPRS scale: 5-6/10 Pain location: knees and legs  Pain description: neuropathy, needles Aggravating factors: worse at night Relieving factors: trying to take the medication  PRECAUTIONS: Fall  RED FLAGS: None   WEIGHT BEARING RESTRICTIONS: No  FALLS: Has patient fallen in last 6 months? Yes. Number of falls 2 One fall was in the bathroom and then another in a parking lot. LIVING ENVIRONMENT: Lives with: lives with their daughter and she lives in [place] and stays with daughter here in [place] Lives in: House/apartment Stairs:  2 story  home Has following equipment at home: Single point cane  PLOF: Independent with household mobility with device and Independent with community mobility with device  PATIENT GOALS: I want to walk again  OBJECTIVE:   TODAY'S TREATMENT: 07/20/23 Activity Comments  NU-step level 4 x 6 min   Tandem forward/retrowalk x 2 min Parallel bars  Multisensory balance Firm/compliant  Rocker-board x 2 min Ant/post Lateral--greater dependence on UE support  Gastroc stretch 2x 60 sec Slantboard to increase flexibility ankle DF  Curb/stair training Requiring BUE support for ascending/descending 6" curb/step. With only cane requiring min-mod A for support. Cues in sequence for ideal LE placement/choice  Heel-toe rocking standing on pool noodle         HOME EXERCISE PROGRAM Last updated: 07/11/23 Access Code: [facility] URL: [email or web address] Date: 07/11/2023 Prepared by: [facility] - Outpatient  Rehab - [facility]  Program Notes stop if pain occurs  Exercises - Feet Together Balance at Counter Top Eyes Closed  - 1 x daily - 7 x weekly - 1-3 sets - 30 sec hold - Corner Balance Feet Together: Eyes Closed With Head Turns  - 1 x daily - 7 x weekly - 1-3 sets - 3 reps - Semi-Tandem Balance at [employer] Eyes Open  - 1 x daily - 7 x weekly - 1-3 sets - 30 sec hold - Side Stepping with Counter Support  - 1 x daily - 7 x weekly - 1-3 sets - 1-2 min hold - Sit to Stand with Counter Support  - 1 x daily - 5 x weekly - 2 sets - 5 reps - Seated Long Arc Quad  - 1 x daily - 5 x weekly - 1-2 sets - 10 reps - Seated Hamstring Curls with Resistance  - 1 x daily - 5 x weekly - 1-2 sets - 10 reps - Standing Gastroc Stretch at Counter  - 1 x daily - 7 x weekly - 3 sets - 60 sec hold - Seated Hamstring Stretch  - 1 x daily - 7 x weekly - 3 sets - 30 sec hold     PATIENT EDUCATION: Education details: HEP update with edu for safety; edu on LE strengthening and its effect on joints Person  educated: Patient Education method: Explanation, Demonstration, Tactile cues, Verbal cues, and Handouts Education comprehension: verbalized understanding and returned demonstration     Note: Objective measures were completed at Evaluation unless otherwise noted.  DIAGNOSTIC FINDINGS: NA  COGNITION: Overall cognitive status: Within functional limits for tasks assessed   SENSATION: Light touch: Impaired  and increased sensitivity more distally  POSTURE:  rounded shoulders  LOWER EXTREMITY ROM:     Active  Right Eval Left Eval  Hip flexion    Hip extension    Hip abduction    Hip adduction    Hip internal rotation    Hip external rotation    Knee flexion    Knee extension  -15  Ankle dorsiflexion neutral 5  Ankle plantarflexion    Ankle inversion    Ankle eversion     (Blank rows = not tested)  LOWER EXTREMITY MMT:    MMT Right Eval Left Eval  Hip flexion 4 4  Hip extension    Hip abduction    Hip adduction    Hip internal rotation    Hip external rotation    Knee flexion 4 3+  Knee extension 4 3+  Ankle dorsiflexion 4 3+  Ankle plantarflexion    Ankle inversion    Ankle eversion    (Blank rows = not tested)   TRANSFERS: Assistive device utilized:  BUE support   Sit to stand: CGA Stand to sit: CGA  GAIT: Gait pattern: step through pattern, decreased step length- Right, decreased step length- Left, antalgic, and wide BOS Distance walked: 80 ft Assistive device utilized: Single point cane Level of assistance: CGA Comments: Looks down at the ground  FUNCTIONAL TESTS:  5 times sit to stand: 45.62 sec with BUE support 10 meter walk test: 32.85 sec  (1 ft/sec) Vitals 98%, HR 66 bpm, then after gait:  95%, HR 81   M-CTSIB  Condition 1: Firm Surface, EO 30 Sec, Mild Sway  Condition 2: Firm Surface, EC 30 Sec, Mild Sway  Condition 3: Foam Surface, EO NT Sec,  NT  Sway  Condition 4: Foam Surface, EC NT                                                                                                                                 TREATMENT DATE: 06/29/2023     GOALS: Goals reviewed with patient? Yes  SHORT TERM GOALS: Target date: 07/28/2023  Pt will be independent with HEP for improved balance, strength, gait. Baseline: Goal status: INITIAL  2.  Pt will improve 5x sit<>stand to less than or equal to 35 sec to demonstrate improved functional strength and transfer efficiency. Baseline: 45.62 Goal status: INITIAL   LONG TERM GOALS: Target date: 08/11/2023  Pt will be independent with HEP for improved balance, strength, gait. Baseline:  Goal status: INITIAL  2.  Pt will improve 5x sit<>stand to less than or equal to 25 sec to demonstrate improved functional strength and transfer efficiency. Baseline: 45 sec Goal status: INITIAL  3.  Pt will improve Berg score by at least 8 points from baseline, to decrease fall risk. Baseline: 38/56 Goal status: INITIAL  4.  Pt will improve gait velocity to at least 1.8 ft/sec for improved gait efficiency and safety. Baseline: 1 ft/sec Goal status: INITIAL   ASSESSMENT:  CLINICAL IMPRESSION: Initiated wih NU-step for dynamic warm-up/cardiovascular conditioning due to c/o increased knee stiffness/LE neuropathy discomfort today.  Dynamic and static balance demands under multisensory conditions to facilitate righting reactions and improve awareness for limits of stability with improved stability under condition 4 M-CTSIB exhibiting mild sway x 30 sec and tolerating more complex demands of head movements w/ eyes closed on compliant surfaces without need for UE support.  Curb/stair negotiation trials ultimately requiring BUE support due to knee weakness/pain as trials with only cane requiring physical assistance of min-mod A for ascending/descending a curb. Continued sessions to progress POC details to improve mobility and reduce risk for falls.   OBJECTIVE IMPAIRMENTS: Abnormal gait, decreased  balance, decreased mobility, difficulty walking, decreased ROM, decreased strength, and impaired flexibility.   ACTIVITY LIMITATIONS: standing, stairs, transfers, and locomotion level  PARTICIPATION LIMITATIONS: cleaning, laundry, community activity, and church  PERSONAL FACTORS: 3+ comorbidities: see PMH above (pt does live in [place] and visits her daughter here in [place] for extended periods of time)  are also affecting patient's functional outcome.   REHAB POTENTIAL: Good  CLINICAL DECISION MAKING: Evolving/moderate complexity  EVALUATION COMPLEXITY: Moderate  PLAN:  PT FREQUENCY: 2x/week  PT DURATION: 6 weeks plus eval  PLANNED INTERVENTIONS: 97110-Therapeutic exercises, 97530- Therapeutic activity, [id number]- Neuromuscular re-education, 97535- Self Care, 13086- Manual therapy, [phone]- Gait training, Patient/Family education, Balance training, Stair training, and DME instructions  PLAN FOR NEXT SESSION:  progress HEP for flexibility, lower extremity strengthening, balance, gait training    11:20 AM, 07/20/23 M. Kelly Amaani Guilbault, PT, DPT Physical Therapist- [place] Office Number: [phone]

## 2023-07-21 NOTE — Telephone Encounter (Signed)
 Error

## 2023-07-23 ENCOUNTER — Encounter: Payer: Self-pay | Admitting: Orthopedic Surgery

## 2023-07-23 DIAGNOSIS — M25562 Pain in left knee: Secondary | ICD-10-CM

## 2023-07-23 DIAGNOSIS — M25561 Pain in right knee: Secondary | ICD-10-CM

## 2023-07-23 MED ORDER — METHYLPREDNISOLONE ACETATE 40 MG/ML IJ SUSP
40.0000 mg | INTRAMUSCULAR | Status: AC | PRN
  Administered 2023-07-23: 40 mg via INTRA_ARTICULAR

## 2023-07-23 MED ORDER — LIDOCAINE HCL 1 % IJ SOLN
5.0000 mL | INTRAMUSCULAR | Status: AC | PRN
  Administered 2023-07-23: 5 mL

## 2023-07-23 NOTE — Progress Notes (Signed)
 Office Visit Note   Patient: Teresa French           Date of Birth: 06/22/1940           MRN: [id number] Visit Date: 06/29/2023              Requested by: Davida Espy, MD [phone] [address]  [place],  [place] [address] PCP: Davida Espy, MD  Chief Complaint  Patient presents with   Right Knee - Pain   Left Knee - Pain      HPI: Patient is an 83 year old woman who presents with chronic bilateral knee pain.  Patient also states she has chronic back pain.  She states she fell about a year ago.  She is not using any topical or oral anti-inflammatories.  She states she does take gabapentin teen.  She states she has tingling down into her feet.  She states her back pain goes away with sitting.  Assessment & Plan: Visit Diagnoses:  1. Acute pain of both knees   2. Chronic midline low back pain without sciatica     Plan: Both knees were injected.  We will set her up for physical therapy upstairs for her back.  Follow-Up Instructions: No follow-ups on file.   Ortho Exam  Patient is alert, oriented, no adenopathy, well-dressed, normal affect, normal respiratory effort. Examination patient has a negative straight leg raise bilaterally with no focal motor weakness.  Examination of her knees she has tricompartmental arthritic changes with pain to palpation of all 3 compartments collaterals and cruciates are stable bilaterally.  There is a mild effusion.  Imaging: No results found. No images are attached to the encounter.  Labs: Lab Results  Component Value Date   HGBA1C 6.8 (H) 06/06/2023   HGBA1C 6.2 (H) 01/23/2018   HGBA1C 5.6 10/29/2015   GRAMSTAIN Gram positive cocci in chains (A) 11/03/2020   LABORGA Normal Oropharyngeal Flora 10/18/2016     Lab Results  Component Value Date   ALBUMIN 4.3 04/08/2022   ALBUMIN 4.5 07/10/2017   ALBUMIN 3.9 03/08/2016    No results found for: "MG" No results found for: "VD25OH"  No results found for: "PREALBUMIN"     Latest Ref Rng & Units 06/06/2023    3:17 PM 04/08/2022    2:06 PM 01/23/2018    3:07 PM  CBC EXTENDED  WBC 4.0 - 10.5 K/uL 5.2  8.7  5.1   RBC 3.87 - 5.11 Mil/uL 3.95  4.28  4.32   Hemoglobin 12.0 - 15.0 g/dL 47.8  29.5  62.1   HCT 36.0 - 46.0 % 34.0  37.3  37.0   Platelets 150.0 - 400.0 K/uL 230.0  277.0  229   NEUT# 1.4 - 7.7 K/uL 3.3  5.8  2,341   Lymph# 0.7 - 4.0 K/uL 1.4  2.1  1,918      There is no height or weight on file to calculate BMI.  Orders:  Orders Placed This Encounter  Procedures   XR Knee 1-2 Views Right   XR Knee 1-2 Views Left   No orders of the defined types were placed in this encounter.    Procedures: Large Joint Inj: bilateral knee on 07/23/2023 12:15 PM Indications: pain and diagnostic evaluation Details: 22 G 1.5 in needle, anteromedial approach  Arthrogram: No  Medications (Right): 5 mL lidocaine  1 %; 40 mg methylPREDNISolone  acetate 40 MG/ML Medications (Left): 5 mL lidocaine  1 %; 40 mg methylPREDNISolone  acetate 40 MG/ML Outcome: tolerated well, no  immediate complications Procedure, treatment alternatives, risks and benefits explained, specific risks discussed. Consent was given by the patient. Immediately prior to procedure a time out was called to verify the correct patient, procedure, equipment, support staff and site/side marked as required. Patient was prepped and draped in the usual sterile fashion.      Clinical Data: No additional findings.  ROS:  All other systems negative, except as noted in the HPI. Review of Systems  Objective: Vital Signs: There were no vitals taken for this visit.  Specialty Comments:  No specialty comments available.  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   Osteoarthritis of right hip 11/21/2014   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   Past Medical History:  Diagnosis Date   Arthritis    Asthma    Borderline diabetes    Breast mass, left 07/10/2017   06/19/17: mammos: lobulated mass sup & lat to nipple; US : 1.4x0.5x1.0 cm  Bi-RADS4   Complication of anesthesia    "I SLEEP A VERY VERY LONG TIME"   Eczema    Environmental allergies    Glaucoma    History of transfusion    HTN (hypertension), benign 07/10/2017   Hyperlipidemia    Hypertension    Memory loss    Tingling    OF FEET    Family History  Problem Relation Age of Onset   Heart disease Mother    Lung cancer Father    Breast cancer Paternal Aunt        62s   Heart disease Maternal Grandmother    Lung cancer Maternal Grandfather    Hypertension Maternal Grandfather    Breast cancer Cousin 35   Breast cancer Cousin 40    Past Surgical History:  Procedure Laterality Date   BREAST BIOPSY Bilateral    3-5 EACH BREAST   BREAST CYST ASPIRATION     CERVICAL LAMINECTOMY     DILATION AND CURETTAGE OF UTERUS     GANGLION CYST EXCISION     X3   JOINT REPLACEMENT  2007   left hip   TOTAL HIP ARTHROPLASTY Right 11/21/2014   Procedure: RIGHT TOTAL HIP ARTHROPLASTY ANTERIOR APPROACH;  Surgeon: Arnie Lao, MD;  Location: [facility];  Service: Orthopedics;  Laterality: Right;   Social History   Occupational History   Not on file  Tobacco Use   Smoking status: Never   Smokeless tobacco: Never  Vaping Use   Vaping status: Never Used  Substance and Sexual Activity   Alcohol use: No   Drug use: No   Sexual activity: Not on file

## 2023-07-25 ENCOUNTER — Ambulatory Visit

## 2023-07-25 DIAGNOSIS — M6281 Muscle weakness (generalized): Secondary | ICD-10-CM

## 2023-07-25 DIAGNOSIS — R2681 Unsteadiness on feet: Secondary | ICD-10-CM

## 2023-07-25 DIAGNOSIS — R2689 Other abnormalities of gait and mobility: Secondary | ICD-10-CM

## 2023-07-25 NOTE — Therapy (Signed)
 OUTPATIENT PHYSICAL THERAPY NEURO TREATMENT   Patient Name: Teresa French MRN: [id number] DOB:[birth year], 83 y.o., female Today's Date: 07/25/2023   PCP: Davida Espy, MD REFERRING PROVIDER: Faustina Hood, MD   END OF SESSION:  PT End of Session - 07/25/23 1016     Visit Number 8    Number of Visits 13    Date for PT Re-Evaluation 08/11/23    Authorization Type [employer]-auth submitted    Authorization Time Period approved 13 PT visits from 06/29/2023-08/11/2023.    PT Start Time 1016    PT Stop Time 1100    PT Time Calculation (min) 44 min    Equipment Utilized During Treatment Gait belt    Activity Tolerance Patient tolerated treatment well    Behavior During Therapy WFL for tasks assessed/performed              Past Medical History:  Diagnosis Date   Arthritis    Asthma    Borderline diabetes    Breast mass, left 07/10/2017   06/19/17: mammos: lobulated mass sup & lat to nipple; US : 1.4x0.5x1.0 cm  Bi-RADS4   Complication of anesthesia    "I SLEEP A VERY VERY LONG TIME"   Eczema    Environmental allergies    Glaucoma    History of transfusion    HTN (hypertension), benign 07/10/2017   Hyperlipidemia    Hypertension    Memory loss    Tingling    OF FEET   Past Surgical History:  Procedure Laterality Date   BREAST BIOPSY Bilateral    3-5 EACH BREAST   BREAST CYST ASPIRATION     CERVICAL LAMINECTOMY     DILATION AND CURETTAGE OF UTERUS     GANGLION CYST EXCISION     X3   JOINT REPLACEMENT  2007   left hip   TOTAL HIP ARTHROPLASTY Right 11/21/2014   Procedure: RIGHT TOTAL HIP ARTHROPLASTY ANTERIOR APPROACH;  Surgeon: Arnie Lao, MD;  Location: [facility];  Service: Orthopedics;  Laterality: Right;   Patient Active Problem List   Diagnosis Date Noted   Bronchiectasis (HCC) 10/28/2020   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   COVID-19 09/16/2020   Asthmatic bronchitis 09/08/2020   OSA (obstructive sleep apnea) 09/08/2020    DVT (deep venous thrombosis) (HCC) 02/12/2019   Breast mass, left 07/10/2017   HTN (hypertension), benign 07/10/2017   Peripheral neuropathy 03/08/2016   Borderline diabetes    Rheumatoid arthritis (HCC) 10/29/2015   DJD (degenerative joint disease) 10/29/2015   Osteoporosis 10/29/2015   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   Osteoarthritis of right hip 11/21/2014   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    ONSET DATE: 06/20/2023  REFERRING DIAG: R26.81 (ICD-10-CM) - Unstable gait   THERAPY DIAG:  Unsteadiness on feet  Other abnormalities of gait and mobility  Muscle weakness (generalized)  Rationale for Evaluation and Treatment: Rehabilitation  SUBJECTIVE:  SUBJECTIVE STATEMENT: Feeling more stiff today, the MD intends to increase prednisone from current 5 to 40 mg  Pt accompanied by: self  PERTINENT HISTORY: Bronchiectasis/ [facility]IC/ M abscessus), HTN, DVT, CAD, Aortic Atherosclerosis, Peripheral Neuropathy, Rheumatoid Arthritis , Osteoporosis, Hyperlipidemia, Glaucoma, L Breast Mass, Environmental Allergies, Memory Loss, Covid Infection Jan 2022, bilateral THR  PAIN:  Are you having pain? Yes: NPRS scale: 5-6/10 Pain location: knees and legs  Pain description: neuropathy, needles Aggravating factors: worse at night Relieving factors: trying to take the medication  PRECAUTIONS: Fall  RED FLAGS: None   WEIGHT BEARING RESTRICTIONS: No  FALLS: Has patient fallen in last 6 months? Yes. Number of falls 2 One fall was in the bathroom and then another in a parking lot. LIVING ENVIRONMENT: Lives with: lives with their daughter and she lives in [place] and stays with daughter here in [place] Lives in: House/apartment Stairs:  2 story  home Has following equipment at home: Single point cane  PLOF: Independent with household mobility with device and Independent with community mobility with device  PATIENT GOALS: I want to walk again  OBJECTIVE:   TODAY'S TREATMENT: 07/25/23 Activity Comments  Seated OKC PRE -ankle pumps 30x -LAQ 3x10 3# -hip add iso 3x10 -seated hamstring curls 3x10 red  -seated clamshells 3x10 red  Sidestep x 2 min along counter 3#  Alt stair taps x 2 min 4" step 3#  Static balance on foam 2 x 5 min Multisensory activities           TODAY'S TREATMENT: 07/20/23 Activity Comments  NU-step level 4 x 6 min   Tandem forward/retrowalk x 2 min Parallel bars  Multisensory balance Firm/compliant  Rocker-board x 2 min Ant/post Lateral--greater dependence on UE support  Gastroc stretch 2x 60 sec Slantboard to increase flexibility ankle DF  Curb/stair training Requiring BUE support for ascending/descending 6" curb/step. With only cane requiring min-mod A for support. Cues in sequence for ideal LE placement/choice  Heel-toe rocking standing on pool noodle         HOME EXERCISE PROGRAM Last updated: 07/11/23 Access Code: [facility] URL: [email or web address] Date: 07/11/2023 Prepared by: [facility] - Outpatient  Rehab - [facility]  Program Notes stop if pain occurs  Exercises - Feet Together Balance at Counter Top Eyes Closed  - 1 x daily - 7 x weekly - 1-3 sets - 30 sec hold - Corner Balance Feet Together: Eyes Closed With Head Turns  - 1 x daily - 7 x weekly - 1-3 sets - 3 reps - Semi-Tandem Balance at [employer] Eyes Open  - 1 x daily - 7 x weekly - 1-3 sets - 30 sec hold - Side Stepping with Counter Support  - 1 x daily - 7 x weekly - 1-3 sets - 1-2 min hold - Sit to Stand with Counter Support  - 1 x daily - 5 x weekly - 2 sets - 5 reps - Seated Long Arc Quad  - 1 x daily - 5 x weekly - 1-2 sets - 10 reps - Seated Hamstring Curls with Resistance  - 1 x daily - 5 x weekly -  1-2 sets - 10 reps - Standing Gastroc Stretch at Counter  - 1 x daily - 7 x weekly - 3 sets - 60 sec hold - Seated Hamstring Stretch  - 1 x daily - 7 x weekly - 3 sets - 30 sec hold     PATIENT EDUCATION: Education details: HEP update with edu for safety; edu on  LE strengthening and its effect on joints Person educated: Patient Education method: Explanation, Demonstration, Tactile cues, Verbal cues, and Handouts Education comprehension: verbalized understanding and returned demonstration     Note: Objective measures were completed at Evaluation unless otherwise noted.  DIAGNOSTIC FINDINGS: NA  COGNITION: Overall cognitive status: Within functional limits for tasks assessed   SENSATION: Light touch: Impaired  and increased sensitivity more distally  POSTURE: rounded shoulders  LOWER EXTREMITY ROM:     Active  Right Eval Left Eval  Hip flexion    Hip extension    Hip abduction    Hip adduction    Hip internal rotation    Hip external rotation    Knee flexion    Knee extension  -15  Ankle dorsiflexion neutral 5  Ankle plantarflexion    Ankle inversion    Ankle eversion     (Blank rows = not tested)  LOWER EXTREMITY MMT:    MMT Right Eval Left Eval  Hip flexion 4 4  Hip extension    Hip abduction    Hip adduction    Hip internal rotation    Hip external rotation    Knee flexion 4 3+  Knee extension 4 3+  Ankle dorsiflexion 4 3+  Ankle plantarflexion    Ankle inversion    Ankle eversion    (Blank rows = not tested)   TRANSFERS: Assistive device utilized:  BUE support   Sit to stand: CGA Stand to sit: CGA  GAIT: Gait pattern: step through pattern, decreased step length- Right, decreased step length- Left, antalgic, and wide BOS Distance walked: 80 ft Assistive device utilized: Single point cane Level of assistance: CGA Comments: Looks down at the ground  FUNCTIONAL TESTS:  5 times sit to stand: 45.62 sec with BUE support 10 meter walk test:  32.85 sec  (1 ft/sec) Vitals 98%, HR 66 bpm, then after gait:  95%, HR 81   M-CTSIB  Condition 1: Firm Surface, EO 30 Sec, Mild Sway  Condition 2: Firm Surface, EC 30 Sec, Mild Sway  Condition 3: Foam Surface, EO NT Sec,  NT  Sway  Condition 4: Foam Surface, EC NT                                                                                                                                TREATMENT DATE: 06/29/2023     GOALS: Goals reviewed with patient? Yes  SHORT TERM GOALS: Target date: 07/28/2023  Pt will be independent with HEP for improved balance, strength, gait. Baseline: Goal status: MET  2.  Pt will improve 5x sit<>stand to less than or equal to 35 sec to demonstrate improved functional strength and transfer efficiency. Baseline: 45.62 Goal status: IN PROGRESS   LONG TERM GOALS: Target date: 08/11/2023  Pt will be independent with HEP for improved balance, strength, gait. Baseline:  Goal status: INITIAL  2.  Pt will improve 5x sit<>stand to less than  or equal to 25 sec to demonstrate improved functional strength and transfer efficiency. Baseline: 45 sec Goal status: INITIAL  3.  Pt will improve Berg score by at least 8 points from baseline, to decrease fall risk. Baseline: 38/56 Goal status: INITIAL  4.  Pt will improve gait velocity to at least 1.8 ft/sec for improved gait efficiency and safety. Baseline: 1 ft/sec Goal status: INITIAL   ASSESSMENT:  CLINICAL IMPRESSION: Reports increased feeling of LE stiffness today. Initiated with seated OKC PRE with progression of resistance with good tolerance. Standing dynamic balance with ankle weights to enhance LE strength/single limb support. Static multisensory balance activities to improve postural stability and awareness for limits of stability with ability to reduce need for UE support under static demands. Continued sessions to progress POC details to improve mobility and reduce risk for falls  OBJECTIVE  IMPAIRMENTS: Abnormal gait, decreased balance, decreased mobility, difficulty walking, decreased ROM, decreased strength, and impaired flexibility.   ACTIVITY LIMITATIONS: standing, stairs, transfers, and locomotion level  PARTICIPATION LIMITATIONS: cleaning, laundry, community activity, and church  PERSONAL FACTORS: 3+ comorbidities: see PMH above (pt does live in [place] and visits her daughter here in [place] for extended periods of time)  are also affecting patient's functional outcome.   REHAB POTENTIAL: Good  CLINICAL DECISION MAKING: Evolving/moderate complexity  EVALUATION COMPLEXITY: Moderate  PLAN:  PT FREQUENCY: 2x/week  PT DURATION: 6 weeks plus eval  PLANNED INTERVENTIONS: 97110-Therapeutic exercises, 97530- Therapeutic activity, [id number]- Neuromuscular re-education, 97535- Self Care, [id number]- Manual therapy, [phone]- Gait training, Patient/Family education, Balance training, Stair training, and DME instructions  PLAN FOR NEXT SESSION:  check STG    10:17 AM, 07/25/23 M. Kelly Haston Casebolt, PT, DPT Physical Therapist- [place] Office Number: [phone]

## 2023-07-27 ENCOUNTER — Ambulatory Visit

## 2023-07-27 DIAGNOSIS — R2689 Other abnormalities of gait and mobility: Secondary | ICD-10-CM

## 2023-07-27 DIAGNOSIS — M6281 Muscle weakness (generalized): Secondary | ICD-10-CM

## 2023-07-27 DIAGNOSIS — R2681 Unsteadiness on feet: Secondary | ICD-10-CM

## 2023-07-27 NOTE — Therapy (Signed)
 OUTPATIENT PHYSICAL THERAPY NEURO TREATMENT   Patient Name: Teresa French MRN: [id number] DOB:Aug 02, 1940, 83 y.o., female Today's Date: 07/27/2023   PCP: Davida Espy, MD REFERRING PROVIDER: Faustina Hood, MD   END OF SESSION:  PT End of Session - 07/27/23 1022     Visit Number 9    Number of Visits 13    Date for PT Re-Evaluation 08/11/23    Authorization Type [employer] Medicare-auth submitted    Authorization Time Period approved 13 PT visits from 06/29/2023-08/11/2023.    PT Start Time 1015    PT Stop Time 1100    PT Time Calculation (min) 45 min    Equipment Utilized During Treatment Gait belt    Activity Tolerance Patient tolerated treatment well    Behavior During Therapy WFL for tasks assessed/performed              Past Medical History:  Diagnosis Date   Arthritis    Asthma    Borderline diabetes    Breast mass, left 07/10/2017   06/19/17: mammos: lobulated mass sup & lat to nipple; US : 1.4x0.5x1.0 cm  Bi-RADS4   Complication of anesthesia    "I SLEEP A VERY VERY LONG TIME"   Eczema    Environmental allergies    Glaucoma    History of transfusion    HTN (hypertension), benign 07/10/2017   Hyperlipidemia    Hypertension    Memory loss    Tingling    OF FEET   Past Surgical History:  Procedure Laterality Date   BREAST BIOPSY Bilateral    3-5 EACH BREAST   BREAST CYST ASPIRATION     CERVICAL LAMINECTOMY     DILATION AND CURETTAGE OF UTERUS     GANGLION CYST EXCISION     X3   JOINT REPLACEMENT  2007   left hip   TOTAL HIP ARTHROPLASTY Right 11/21/2014   Procedure: RIGHT TOTAL HIP ARTHROPLASTY ANTERIOR APPROACH;  Surgeon: Arnie Lao, MD;  Location: [facility];  Service: Orthopedics;  Laterality: Right;   Patient Active Problem List   Diagnosis Date Noted   Bronchiectasis (HCC) 10/28/2020   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   COVID-19 09/16/2020   Asthmatic bronchitis 09/08/2020   OSA (obstructive sleep apnea) 09/08/2020    DVT (deep venous thrombosis) (HCC) 02/12/2019   Breast mass, left 07/10/2017   HTN (hypertension), benign 07/10/2017   Peripheral neuropathy 03/08/2016   Borderline diabetes    Rheumatoid arthritis (HCC) 10/29/2015   DJD (degenerative joint disease) 10/29/2015   Osteoporosis 10/29/2015   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   Osteoarthritis of right hip 11/21/2014   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    ONSET DATE: 06/20/2023  REFERRING DIAG: R26.81 (ICD-10-CM) - Unstable gait   THERAPY DIAG:  Unsteadiness on feet  Other abnormalities of gait and mobility  Muscle weakness (generalized)  Rationale for Evaluation and Treatment: Rehabilitation  SUBJECTIVE:  SUBJECTIVE STATEMENT: "Not sure if I've made much progress"  Pt accompanied by: self  PERTINENT HISTORY: Bronchiectasis/ [facility]IC/ M abscessus), HTN, DVT, CAD, Aortic Atherosclerosis, Peripheral Neuropathy, Rheumatoid Arthritis , Osteoporosis, Hyperlipidemia, Glaucoma, L Breast Mass, Environmental Allergies, Memory Loss, Covid Infection Jan 2022, bilateral THR  PAIN:  Are you having pain? Yes: NPRS scale: 5-6/10 Pain location: knees and legs  Pain description: neuropathy, needles Aggravating factors: worse at night Relieving factors: trying to take the medication  PRECAUTIONS: Fall  RED FLAGS: None   WEIGHT BEARING RESTRICTIONS: No  FALLS: Has patient fallen in last 6 months? Yes. Number of falls 2 One fall was in the bathroom and then another in a parking lot. LIVING ENVIRONMENT: Lives with: lives with their daughter and she lives in [place] and stays with daughter here in [place] Lives in: House/apartment Stairs: 2 story home Has following equipment at home: Single point  cane  PLOF: Independent with household mobility with device and Independent with community mobility with device  PATIENT GOALS: I want to walk again  OBJECTIVE:   TODAY'S TREATMENT: 07/27/23 Activity Comments  Seated LE OKC PRE -ankle pumps 30x -LAQ 2x10 4# -hip add iso 3x10 -hamstring curls 2x10 red band  5xSTS test: 1 min 23 sec                  TODAY'S TREATMENT: 07/25/23 Activity Comments  Seated OKC PRE -ankle pumps 30x -LAQ 3x10 3# -hip add iso 3x10 -seated hamstring curls 3x10 red  -seated clamshells 3x10 red  Sidestep x 2 min along counter 3#  Alt stair taps x 2 min 4" step 3#  Static balance on foam 2 x 5 min Multisensory activities               HOME EXERCISE PROGRAM Last updated: 07/11/23 Access Code: [email or web address] URL: [email or web address] Date: 07/11/2023 Prepared by: [facility] - Outpatient  Rehab - [facility]  Program Notes stop if pain occurs  Exercises - Feet Together Balance at Counter Top Eyes Closed  - 1 x daily - 7 x weekly - 1-3 sets - 30 sec hold - Corner Balance Feet Together: Eyes Closed With Head Turns  - 1 x daily - 7 x weekly - 1-3 sets - 3 reps - Semi-Tandem Balance at [employer] Eyes Open  - 1 x daily - 7 x weekly - 1-3 sets - 30 sec hold - Side Stepping with Counter Support  - 1 x daily - 7 x weekly - 1-3 sets - 1-2 min hold - Sit to Stand with Counter Support  - 1 x daily - 5 x weekly - 2 sets - 5 reps - Seated Long Arc Quad  - 1 x daily - 5 x weekly - 1-2 sets - 10 reps - Seated Hamstring Curls with Resistance  - 1 x daily - 5 x weekly - 1-2 sets - 10 reps - Standing Gastroc Stretch at Counter  - 1 x daily - 7 x weekly - 3 sets - 60 sec hold - Seated Hamstring Stretch  - 1 x daily - 7 x weekly - 3 sets - 30 sec hold     PATIENT EDUCATION: Education details: HEP update with edu for safety; edu on LE strengthening and its effect on joints Person educated: Patient Education method: Explanation, Demonstration,  Tactile cues, Verbal cues, and Handouts Education comprehension: verbalized understanding and returned demonstration     Note: Objective measures were completed at Evaluation unless otherwise noted.  DIAGNOSTIC FINDINGS: NA  COGNITION: Overall cognitive status: Within functional limits for tasks assessed   SENSATION: Light touch: Impaired  and increased sensitivity more distally  POSTURE: rounded shoulders  LOWER EXTREMITY ROM:     Active  Right Eval Left Eval  Hip flexion    Hip extension    Hip abduction    Hip adduction    Hip internal rotation    Hip external rotation    Knee flexion    Knee extension  -15  Ankle dorsiflexion neutral 5  Ankle plantarflexion    Ankle inversion    Ankle eversion     (Blank rows = not tested)  LOWER EXTREMITY MMT:    MMT Right Eval Left Eval  Hip flexion 4 4  Hip extension    Hip abduction    Hip adduction    Hip internal rotation    Hip external rotation    Knee flexion 4 3+  Knee extension 4 3+  Ankle dorsiflexion 4 3+  Ankle plantarflexion    Ankle inversion    Ankle eversion    (Blank rows = not tested)   TRANSFERS: Assistive device utilized: BUE support  Sit to stand: CGA Stand to sit: CGA  GAIT: Gait pattern: step through pattern, decreased step length- Right, decreased step length- Left, antalgic, and wide BOS Distance walked: 80 ft Assistive device utilized: Single point cane Level of assistance: CGA Comments: Looks down at the ground  FUNCTIONAL TESTS:  5 times sit to stand: 45.62 sec with BUE support 10 meter walk test: 32.85 sec (1 ft/sec) Vitals 98%, HR 66 bpm, then after gait:  95%, HR 81   M-CTSIB  Condition 1: Firm Surface, EO 30 Sec, Mild Sway  Condition 2: Firm Surface, EC 30 Sec, Mild Sway  Condition 3: Foam Surface, EO NT Sec, NT Sway  Condition 4: Foam Surface, EC NT                                                                                                                                 TREATMENT DATE: 06/29/2023     GOALS: Goals reviewed with patient? Yes  SHORT TERM GOALS: Target date: 07/28/2023  Pt will be independent with HEP for improved balance, strength, gait. Baseline: Goal status: MET  2.  Pt will improve 5x sit<>stand to less than or equal to 35 sec to demonstrate improved functional strength and transfer efficiency. Baseline: 45.62; 1 min 23 sec dependence on BUE sit<>stand Goal status: NOT MET   LONG TERM GOALS: Target date: 08/11/2023  Pt will be independent with HEP for improved balance, strength, gait. Baseline:  Goal status: INITIAL  2.  Pt will improve 5x sit<>stand to less than or equal to 25 sec to demonstrate improved functional strength and transfer efficiency. Baseline: 45 sec Goal status: INITIAL  3.  Pt will improve Berg score by at least 8 points from baseline, to decrease fall risk. Baseline: 38/56 Goal status: INITIAL  4.  Pt will improve gait velocity to at least 1.8 ft/sec for improved gait efficiency and safety. Baseline: 1 ft/sec Goal status: INITIAL   ASSESSMENT:  CLINICAL IMPRESSION: Reports increased feeling of LE stiffness today. Initiated with seated OKC PRE with progression of resistance with good tolerance increasing weight but decreasing reps to accommodate. Repeat of 5xSTS test requiring increased time from baseline and continued dependence on BUE support for sit to stand motion most likely due to her bilat knee dysfunction from OA.  Educated on benefits of continued strength training and cardiovascular exercise to improve function and activity tolerance.   OBJECTIVE IMPAIRMENTS: Abnormal gait, decreased balance, decreased mobility, difficulty walking, decreased ROM, decreased strength, and impaired flexibility.   ACTIVITY LIMITATIONS: standing, stairs, transfers, and locomotion level  PARTICIPATION LIMITATIONS: cleaning, laundry, community activity, and church  PERSONAL FACTORS: 3+ comorbidities: see PMH  above (pt does live in [place] and visits her daughter here in [place] for extended periods of time) are also affecting patient's functional outcome.   REHAB POTENTIAL: Good  CLINICAL DECISION MAKING: Evolving/moderate complexity  EVALUATION COMPLEXITY: Moderate  PLAN:  PT FREQUENCY: 2x/week  PT DURATION: 6 weeks plus eval  PLANNED INTERVENTIONS: 97110-Therapeutic exercises, 97530- Therapeutic activity, [id number]- Neuromuscular re-education, 97535- Self Care, 16109- Manual therapy, [phone]- Gait training, Patient/Family education, Balance training, Stair training, and DME instructions  PLAN FOR NEXT SESSION:  HEP review and progression    10:23 AM, 07/27/23 M. Kelly Tirzah Fross, PT, DPT Physical Therapist- [place] Office Number: [phone]

## 2023-07-31 ENCOUNTER — Ambulatory Visit: Admitting: Rehabilitative and Restorative Service Providers"

## 2023-07-31 ENCOUNTER — Encounter: Payer: Self-pay | Admitting: Rehabilitative and Restorative Service Providers"

## 2023-07-31 DIAGNOSIS — M25561 Pain in right knee: Secondary | ICD-10-CM

## 2023-07-31 DIAGNOSIS — M25562 Pain in left knee: Secondary | ICD-10-CM | POA: Diagnosis not present

## 2023-07-31 DIAGNOSIS — M5459 Other low back pain: Secondary | ICD-10-CM | POA: Diagnosis not present

## 2023-07-31 DIAGNOSIS — R262 Difficulty in walking, not elsewhere classified: Secondary | ICD-10-CM

## 2023-07-31 DIAGNOSIS — M6281 Muscle weakness (generalized): Secondary | ICD-10-CM

## 2023-07-31 DIAGNOSIS — G8929 Other chronic pain: Secondary | ICD-10-CM

## 2023-07-31 NOTE — Therapy (Signed)
 OUTPATIENT PHYSICAL THERAPY TREATMENT/ PROGRESS NOTE/ RECERT   Patient Name: Teresa French MRN: [id number] DOB:06-12-1940, 83 y.o., female Today's Date: 07/31/2023  Progress Note Reporting Period 06/29/2023 to 07/31/2023  See note below for Objective Data and Assessment of Progress/Goals.      END OF SESSION:  PT End of Session - 07/31/23 1346     Visit Number 10    Number of Visits 29    Date for PT Re-Evaluation 10/09/23    Authorization Type [employer]-auth submitted    Progress Note Due on Visit 19    PT Start Time 1300    PT Stop Time 1346    PT Time Calculation (min) 46 min    Equipment Utilized During Treatment --    Activity Tolerance Patient limited by fatigue;Patient limited by pain    Behavior During Therapy WFL for tasks assessed/performed             Past Medical History:  Diagnosis Date   Arthritis    Asthma    Borderline diabetes    Breast mass, left 07/10/2017   06/19/17: mammos: lobulated mass sup & lat to nipple; US : 1.4x0.5x1.0 cm  Bi-RADS4   Complication of anesthesia    "I SLEEP A VERY VERY LONG TIME"   Eczema    Environmental allergies    Glaucoma    History of transfusion    HTN (hypertension), benign 07/10/2017   Hyperlipidemia    Hypertension    Memory loss    Tingling    OF FEET   Past Surgical History:  Procedure Laterality Date   BREAST BIOPSY Bilateral    3-5 EACH BREAST   BREAST CYST ASPIRATION     CERVICAL LAMINECTOMY     DILATION AND CURETTAGE OF UTERUS     GANGLION CYST EXCISION     X3   JOINT REPLACEMENT  2007   left hip   TOTAL HIP ARTHROPLASTY Right 11/21/2014   Procedure: RIGHT TOTAL HIP ARTHROPLASTY ANTERIOR APPROACH;  Surgeon: Arnie Lao, MD;  Location: [facility];  Service: Orthopedics;  Laterality: Right;   Patient Active Problem List   Diagnosis Date Noted   Bronchiectasis (HCC) 10/28/2020   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   COVID-19 09/16/2020   Asthmatic bronchitis 09/08/2020    OSA (obstructive sleep apnea) 09/08/2020   DVT (deep venous thrombosis) (HCC) 02/12/2019   Breast mass, left 07/10/2017   HTN (hypertension), benign 07/10/2017   Peripheral neuropathy 03/08/2016   Borderline diabetes    Rheumatoid arthritis (HCC) 10/29/2015   DJD (degenerative joint disease) 10/29/2015   Osteoporosis 10/29/2015   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   SUBJECTIVE:  SUBJECTIVE STATEMENT: Pt indicated having trouble with standing, sitting and walking.  Pt indicated having complaints of bilateral knee pain Lt > Rt.  Pt indicated having complaints of popping in knee.  Pt indicated having some pain in back with standing prolonged.  Pt indicated she was able to sleep at night without pain troubles.    PERTINENT HISTORY:  Medical history: arthritis, asthma, HTN, hyperlipidemia, memory loss.  Recently seen by [facility] for referral "unsteady gait". Today is a continuation of that treatment.   PAIN:  NPRS scale: at worst for knees 10/10, at worst for back 10/10  Pain location: knees, back Pain description: sharp, achy, sore Aggravating factors: WB activity, standing for back Relieving factors: resting, sitting.  Takes medicine  for pain  PRECAUTIONS: None  WEIGHT BEARING RESTRICTIONS: No  FALLS:  Has patient fallen in last 6 months? Yes. Number of falls 2 One fall was in the bathroom and then another in a parking lot.  LIVING ENVIRONMENT: Lives with: lives with their daughter and she lives in [place] and stays with daughter here in [place] Lives in: House/apartment Stairs: 2 story home with bedroom on 2nd floor  Has following equipment at home: Single point cane  OCCUPATION: Retired  PLOF: Independent in daily activity.  Reported no specific hobbies.   PATIENT GOALS: Reduce pain, walk better.   OBJECTIVE:   PATIENT SURVEYS:  Patient-Specific Activity Scoring Scheme  "0" represents "unable to perform." "10" represents "able to perform at prior level. 0 1 2 3 4 5 6 7 8 9  10 (Date and Score)   Activity 07/31/2023    1. Standing prolonged  0    2. walking  3    3. stairs 2   4.chair transfers 2   5.    Score 1.75 avg    Total score = sum of the activity scores/number of activities Minimum detectable change (90%CI) for average score = 2 points Minimum detectable change (90%CI) for single activity score = 3 points  SCREENING FOR RED FLAGS: 07/31/2023 Bowel or bladder incontinence: No Cauda equina syndrome: No  COGNITION: 07/31/2023 Overall cognitive status: WFL normal      SENSATION: 07/31/2023 No specific testing today  MUSCLE LENGTH: 07/31/2023 No specific measurements.   POSTURE:  07/31/2023 rounded shoulders, forward head, decreased lumbar lordosis, increased thoracic kyphosis, and flexed trunk   PALPATION: 07/31/2023 No specific testing today  LOWER EXTREMITY ROM:      Right 07/31/2023 Left 07/31/2023  Hip flexion    Hip extension    Hip abduction    Hip adduction    Hip internal rotation    Hip external rotation    Knee flexion    Knee extension    Ankle dorsiflexion    Ankle plantarflexion    Ankle inversion    Ankle eversion     (Blank rows = not  tested)  LOWER EXTREMITY MMT:    MMT Right 06/29/2023 Left 06/29/2023 Right 07/31/2023 Left 07/31/2023  Hip flexion 4/5 4/5 5/5 4/5  Hip extension      Hip abduction      Hip adduction      Hip internal rotation      Hip external rotation      Knee flexion 4/5 3+/5 5/5 5/5  Knee extension 4/5 3+/5 4/5 4/5  Ankle dorsiflexion 4/5 3+/5 4/5 4/5  Ankle plantarflexion      Ankle inversion      Ankle eversion       (Blank rows =  not tested)  SPECIAL TESTS:  07/31/2023 No specific testing today  FUNCTIONAL TESTS:  07/31/2023 TUG with SPC in Rt UE;  34 seconds    07/31/23 0001  Berg Balance Test  Sit to Stand 2  Standing Unsupported 4  Sitting with Back Unsupported but Feet Supported on Floor or Stool 4  Stand to Sit 2  Transfers 3  Standing Unsupported with Eyes Closed 4  Standing Unsupported with Feet Together 3  From Standing, Reach Forward with Outstretched Arm 1  From Standing Position, Pick up Object from Floor 1  From Standing Position, Turn to Look Behind Over each Shoulder 2  Turn 360 Degrees 1  Standing Unsupported, Alternately Place Feet on Step/Stool 0  Standing Unsupported, One Foot in Front 2  Standing on One Leg 1  Total Score 30    07/03/2023: BERG testing: 38/56  06/29/2023 eval: 5 times sit to stand: 45.62 sec with BUE support 10 meter walk test: 32.85 sec (1 ft/sec)   GAIT: 07/31/2023 Household distances in clinic c SPC in Rt hand.  Reduced step length noted bilaterally with forward head/trunk flexion, reduced gait speed and wider base of support.                                                                                                                                                                                                                    TODAY'S TREATMENT:                                                                                                         DATE: 07/31/2023  Therex: Nustep lvl 5 9 mins UE/LE Seated marching x 10  bilateral Seated LAQ x 10 bilateral Seated green band hip abduction clam shell x 5 bilateral  Review of existing HEP, updates.  Time spent in review.    Physical Performance Testing See berg testing, TUG testing above.  Time spent in education of techniques and performance.  Additional time required for completion of testing. Testing scoring listed above.  PATIENT EDUCATION:  Education details: HEP, POC  updates Person educated: Patient Education method: Explanation, Demonstration, Verbal cues, and Handouts Education comprehension: verbalized understanding, returned demonstration, and verbal cues required  HOME EXERCISE PROGRAM: Access Code: [email or web address] URL: [email or web address] Date: 07/31/2023 Prepared by: Bonna Bustard  Program Notes stop if pain occurs  Exercises - Seated Long Arc Quad  - 1-2 x daily - 7 x weekly - 1-2 sets - 10 reps - 2 hold - Seated Hamstring Curls with Resistance  - 1 x daily - 7 x weekly - 1-2 sets - 10 reps - Seated March  - 1-2 x daily - 7 x weekly - 1-2 sets - 10 reps - Sit to Stand with Counter Support  - 1 x daily - 5 x weekly - 2 sets - 5 reps - Seated Hip Abduction with Resistance  - 1 x daily - 7 x weekly - 1-2 sets - 10 reps   ASSESSMENT:  CLINICAL IMPRESSION: Patient is a 83 y.o. who comes to clinic with complaints of bilateral knee pain, back pain with mobility, strength and movement coordination deficits that impair their ability to perform usual daily and recreational functional activities without increase difficulty/symptoms at this time.  Noted fall risk in testing. Patient to benefit from skilled PT services to address impairments and limitations to improve to previous level of function without restriction secondary to condition.   OBJECTIVE IMPAIRMENTS: Abnormal gait, decreased activity tolerance, decreased balance, decreased coordination, decreased endurance, decreased mobility, difficulty walking, decreased ROM, decreased  strength, increased fascial restrictions, impaired perceived functional ability, impaired flexibility, impaired UE functional use, improper body mechanics, postural dysfunction, and pain.   ACTIVITY LIMITATIONS: carrying, lifting, bending, sitting, standing, squatting, stairs, transfers, bed mobility, reach over head, hygiene/grooming, and locomotion level  PARTICIPATION LIMITATIONS: meal prep, cleaning, laundry, interpersonal relationship, driving, shopping, and community activity  PERSONAL FACTORS: Medical history: arthritis, asthma, HTN, hyperlipidemia, memory loss, time since onset, multiple body parts involved are also affecting patient's functional outcome.   REHAB POTENTIAL: Fair to good  CLINICAL DECISION MAKING: Evolving/moderate complexity  EVALUATION COMPLEXITY: Moderate   GOALS: Goals reviewed with patient? Yes  SHORT TERM GOALS: (target date for Short term goals are 3 weeks 08/21/2023)  1. Patient will demonstrate independent use of home exercise program to maintain progress from in clinic treatments.  Goal status: New  LONG TERM GOALS: (target dates for all long term goals are 10 weeks  10/09/2023 )   1. Patient will demonstrate/report pain at worst less than or equal to 2/10 to facilitate minimal limitation in daily activity secondary to pain symptoms.  Goal status: New   2. Patient will demonstrate independent use of home exercise program to facilitate ability to maintain/progress functional gains from skilled physical therapy services.  Goal status: New   3. Patient will demonstrate Patient specific functional scale avg > or = 7/10 to indicate reduced disability due to condition.   Goal status: New   4. Patient will demonstrate Randye Buttner testing > 45 to indicate reduced fall risk for daily mobility.   Goal status: New   5.  Patient will demonstrate TUG < 15 seconds to indicate reduced fall risk for daily mobility.   Goal status: New   6.  Patient will demonstrate  bilateral LE MMT 5/5 to facilitate transfers, ambulation.  Goal status: New   7.  Patient will demonstrate reciprocal gait pattern up/down stairs with hand rail and cane for household activity.  Goal Status: New  PLAN:  PT FREQUENCY: 2x/week  PT DURATION: 10 weeks  PLANNED INTERVENTIONS:  Can include 40981- PT Re-evaluation, 97110-Therapeutic exercises, 97530- Therapeutic activity, 97112- Neuromuscular re-education, [phone]- Self Care, [phone]- Manual therapy, [phone]- Gait training,[id number]- Electrical stimulation (unattended), 97750 Physical performance testing,    Patient/Family education, Balance training, Stair training, Taping, Dry Needling, Joint mobilization, Joint manipulation, Spinal manipulation, Spinal mobilization, Scar mobilization, Vestibular training, Visual/preceptual remediation/compensation, DME instructions, Cryotherapy, and Moist heat.  All performed as medically necessary.  All included unless contraindicated  PLAN FOR NEXT SESSION: Review HEP knowledge/results.  Early strengthening, balance improvements.    Bonna Bustard, PT, DPT, OCS, ATC 07/31/23  2:36 PM   Referring diagnosis? M25.561,M25.562 (ICD-10-CM) - Acute pain of both knees Treatment diagnosis? (if different than referring diagnosis) M25.562, M25.561, M54.59, M62.81 What was this (referring dx) caused by? []  Surgery []  Fall [x]  Ongoing issue [x]  Arthritis []  Other: ____________  Laterality: []  Rt []  Lt [x]  Both  Check all possible CPT codes:  *CHOOSE 10 OR LESS*    See Planned Interventions listed in the Plan section of the Evaluation.

## 2023-08-03 ENCOUNTER — Ambulatory Visit: Admitting: Rehabilitative and Restorative Service Providers"

## 2023-08-03 ENCOUNTER — Encounter: Payer: Self-pay | Admitting: Rehabilitative and Restorative Service Providers"

## 2023-08-03 DIAGNOSIS — M25562 Pain in left knee: Secondary | ICD-10-CM | POA: Diagnosis not present

## 2023-08-03 DIAGNOSIS — M5459 Other low back pain: Secondary | ICD-10-CM | POA: Diagnosis not present

## 2023-08-03 DIAGNOSIS — M6281 Muscle weakness (generalized): Secondary | ICD-10-CM | POA: Diagnosis not present

## 2023-08-03 DIAGNOSIS — M25561 Pain in right knee: Secondary | ICD-10-CM | POA: Diagnosis not present

## 2023-08-03 DIAGNOSIS — G8929 Other chronic pain: Secondary | ICD-10-CM

## 2023-08-03 DIAGNOSIS — R262 Difficulty in walking, not elsewhere classified: Secondary | ICD-10-CM

## 2023-08-03 NOTE — Therapy (Signed)
 OUTPATIENT PHYSICAL THERAPY TREATMENT   Patient Name: Teresa French MRN: [id number] DOB:1940-12-20, 83 y.o., female Today's Date: 08/03/2023   END OF SESSION:  PT End of Session - 08/03/23 1352     Visit Number 11    Number of Visits 29    Date for PT Re-Evaluation 10/09/23    Authorization Type [employer]-auth submitted    Progress Note Due on Visit 19    PT Start Time 1346    PT Stop Time 1426    PT Time Calculation (min) 40 min    Activity Tolerance Patient limited by fatigue    Behavior During Therapy WFL for tasks assessed/performed              Past Medical History:  Diagnosis Date   Arthritis    Asthma    Borderline diabetes    Breast mass, left 07/10/2017   06/19/17: mammos: lobulated mass sup & lat to nipple; US : 1.4x0.5x1.0 cm  Bi-RADS4   Complication of anesthesia    "I SLEEP A VERY VERY LONG TIME"   Eczema    Environmental allergies    Glaucoma    History of transfusion    HTN (hypertension), benign 07/10/2017   Hyperlipidemia    Hypertension    Memory loss    Tingling    OF FEET   Past Surgical History:  Procedure Laterality Date   BREAST BIOPSY Bilateral    3-5 EACH BREAST   BREAST CYST ASPIRATION     CERVICAL LAMINECTOMY     DILATION AND CURETTAGE OF UTERUS     GANGLION CYST EXCISION     X3   JOINT REPLACEMENT  2007   left hip   TOTAL HIP ARTHROPLASTY Right 11/21/2014   Procedure: RIGHT TOTAL HIP ARTHROPLASTY ANTERIOR APPROACH;  Surgeon: Arnie Lao, MD;  Location: [facility];  Service: Orthopedics;  Laterality: Right;   Patient Active Problem List   Diagnosis Date Noted   Bronchiectasis (HCC) 10/28/2020   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   COVID-19 09/16/2020   Asthmatic bronchitis 09/08/2020   OSA (obstructive sleep apnea) 09/08/2020   DVT (deep venous thrombosis) (HCC) 02/12/2019   Breast mass, left 07/10/2017   HTN (hypertension), benign 07/10/2017   Peripheral neuropathy 03/08/2016   Borderline diabetes     Rheumatoid arthritis (HCC) 10/29/2015   DJD (degenerative joint disease) 10/29/2015   Osteoporosis 10/29/2015   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   SUBJECTIVE:  SUBJECTIVE STATEMENT: Pt indicated feeling tired after last visit but not specific to a body part.  Reported knee pain today but " not bad."    PERTINENT HISTORY:  Medical history: arthritis, asthma, HTN, hyperlipidemia, memory loss.  Recently seen by [facility] for referral "unsteady gait". Today is a continuation of that treatment.   PAIN:  NPRS scale: at worst for knees 10/10, at worst for back 10/10  Pain location: knees, back Pain description: sharp, achy, sore Aggravating factors: WB activity, standing for back Relieving factors: resting, sitting.  Takes medicine for pain  PRECAUTIONS: None  WEIGHT BEARING RESTRICTIONS: No  FALLS:  Has patient fallen in last 6 months? Yes. Number of falls 2 One fall was in the bathroom and then another in a parking lot.  LIVING ENVIRONMENT: Lives with: lives with their daughter and she lives in [place] and stays with daughter here in [place] Lives in: House/apartment Stairs: 2 story home with bedroom on 2nd floor   Has following equipment at home: Single point cane  OCCUPATION: Retired  PLOF: Independent in daily activity.  Reported no specific hobbies.   PATIENT GOALS: Reduce pain, walk better.   OBJECTIVE:   PATIENT SURVEYS:  Patient-Specific Activity Scoring Scheme  "0" represents "unable to perform." "10" represents "able to perform at prior level. 0 1 2 3 4 5 6 7 8 9  10 (Date and Score)   Activity 07/31/2023    1. Standing prolonged  0    2. walking  3    3. stairs 2   4.chair transfers 2   5.    Score 1.75 avg    Total score = sum of the activity scores/number of activities Minimum detectable change (90%CI) for average score = 2 points Minimum detectable change (90%CI) for single activity score = 3 points  SCREENING FOR RED FLAGS: 07/31/2023 Bowel or bladder incontinence: No Cauda equina syndrome: No  COGNITION: 07/31/2023 Overall cognitive status: WFL normal      SENSATION: 07/31/2023 No specific testing today  MUSCLE LENGTH: 07/31/2023 No specific measurements.   POSTURE:  07/31/2023 rounded shoulders, forward head, decreased lumbar lordosis, increased thoracic kyphosis, and flexed trunk   PALPATION: 07/31/2023 No specific testing today  LOWER EXTREMITY ROM:      Right 07/31/2023 Left 07/31/2023  Hip flexion    Hip extension    Hip abduction    Hip adduction    Hip internal rotation    Hip external rotation    Knee flexion    Knee extension    Ankle dorsiflexion    Ankle plantarflexion    Ankle inversion    Ankle eversion     (Blank rows = not tested)  LOWER EXTREMITY MMT:    MMT Right 06/29/2023 Left 06/29/2023 Right 07/31/2023 Left 07/31/2023  Hip flexion 4/5 4/5 5/5 4/5  Hip extension      Hip abduction      Hip adduction      Hip internal rotation      Hip external rotation      Knee flexion 4/5 3+/5 5/5 5/5  Knee extension 4/5 3+/5 4/5 4/5  Ankle dorsiflexion 4/5 3+/5 4/5 4/5  Ankle plantarflexion      Ankle inversion      Ankle  eversion       (Blank rows = not tested)  SPECIAL TESTS:  07/31/2023 No specific testing today  FUNCTIONAL TESTS:  07/31/2023 TUG with SPC in Rt UE;  34 seconds    07/31/23 0001  Berg Balance Test  Sit to Stand 2  Standing Unsupported 4  Sitting with Back Unsupported but Feet Supported on Floor or Stool 4  Stand to Sit 2  Transfers 3  Standing Unsupported with Eyes Closed 4  Standing Unsupported with Feet Together 3  From Standing, Reach Forward with Outstretched Arm 1  From Standing Position, Pick up Object from Floor 1  From Standing Position, Turn to Look Behind Over each Shoulder 2  Turn 360 Degrees 1  Standing Unsupported, Alternately Place Feet on Step/Stool 0  Standing Unsupported, One Foot in Front 2  Standing on One Leg 1  Total Score 30    07/03/2023: BERG testing: 38/56  06/29/2023 eval: 5 times sit to stand: 45.62 sec with BUE support 10 meter walk test: 32.85 sec (1 ft/sec)   GAIT: 07/31/2023 Household distances in clinic c SPC in Rt hand.  Reduced step length noted bilaterally with forward head/trunk flexion, reduced gait speed and wider base of support.                                                                                                                                                                                                                    TODAY'S TREATMENT:                                                                                                         DATE: 08/03/2023  Therex: Seated tband hip abduction clam shell x 15 bilateral green band without back support Seated marching green band alternating x 10 bilateral without back support.  Nustep lvl 5 UE/LE for Rom, endurance 10 mins   Neuro Re-ed (balance, stability improvements, muscle activation for postural control, etc) Feet together stance 1 min eyes open, 1 min eyes closed with SBA Tandem stance modified (foot forward step) 30 sec x 2 bilateral in SBA Standing slow  marching x 10 bilateral with occasional min A from clinician to prevent LOB.  Fwd/back ambulation in // bars with SBA and minimal HHA 10 ft x 3 Lateral stepping 10 ft x  3 in // bars with minimal HHA   TODAY'S TREATMENT:                                                                                                         DATE: 07/31/2023  Therex: Nustep lvl 5 9 mins UE/LE Seated marching x 10 bilateral Seated LAQ x 10 bilateral Seated green band hip abduction clam shell x 5 bilateral  Review of existing HEP, updates.  Time spent in review.    Physical Performance Testing See berg testing, TUG testing above.  Time spent in education of techniques and performance.  Additional time required for completion of testing. Testing scoring listed above.  PATIENT EDUCATION:  Education details: HEP, POC updates Person educated: Patient Education method: Explanation, Demonstration, Verbal cues, and Handouts Education comprehension: verbalized understanding, returned demonstration, and verbal cues required  HOME EXERCISE PROGRAM: Access Code: [email or web address] URL: [email or web address] Date: 07/31/2023 Prepared by: Bonna Bustard  Program Notes stop if pain occurs  Exercises - Seated Long Arc Quad  - 1-2 x daily - 7 x weekly - 1-2 sets - 10 reps - 2 hold - Seated Hamstring Curls with Resistance  - 1 x daily - 7 x weekly - 1-2 sets - 10 reps - Seated March  - 1-2 x daily - 7 x weekly - 1-2 sets - 10 reps - Sit to Stand with Counter Support  - 1 x daily - 5 x weekly - 2 sets - 5 reps - Seated Hip Abduction with Resistance  - 1 x daily - 7 x weekly - 1-2 sets - 10 reps   ASSESSMENT:  CLINICAL IMPRESSION: Pt performed relatively well on static balance intervention.  Lacking in SLS control which can impact ambulation independently.  Some fatigue noted in activity and will plan to continue to adjust intervention accordingly.  Continued skilled PT services warranted at this time.    OBJECTIVE IMPAIRMENTS: Abnormal gait, decreased activity tolerance, decreased balance, decreased coordination, decreased endurance, decreased mobility, difficulty walking, decreased ROM, decreased strength, increased fascial restrictions, impaired perceived functional ability, impaired flexibility, impaired UE functional use, improper body mechanics, postural dysfunction, and pain.   ACTIVITY LIMITATIONS: carrying, lifting, bending, sitting, standing, squatting, stairs, transfers, bed mobility, reach over head, hygiene/grooming, and locomotion level  PARTICIPATION LIMITATIONS: meal prep, cleaning, laundry, interpersonal relationship, driving, shopping, and community activity  PERSONAL FACTORS: Medical history: arthritis, asthma, HTN, hyperlipidemia, memory loss, time since onset, multiple body parts involved are also affecting patient's functional outcome.   REHAB POTENTIAL: Fair to good  CLINICAL DECISION MAKING: Evolving/moderate complexity  EVALUATION COMPLEXITY: Moderate   GOALS: Goals reviewed with patient? Yes  SHORT TERM GOALS: (target date for Short term goals are 3 weeks 08/21/2023)  1. Patient will demonstrate independent use of home exercise program to maintain progress from in clinic treatments.  Goal status: on going 08/03/2023  LONG TERM GOALS: (target dates for all long term goals are 10 weeks  10/09/2023 )   1. Patient will demonstrate/report pain at worst less than or equal to 2/10 to facilitate minimal limitation in daily  activity secondary to pain symptoms.  Goal status: New   2. Patient will demonstrate independent use of home exercise program to facilitate ability to maintain/progress functional gains from skilled physical therapy services.  Goal status: New   3. Patient will demonstrate Patient specific functional scale avg > or = 7/10 to indicate reduced disability due to condition.   Goal status: New   4. Patient will demonstrate Randye Buttner testing > 45 to  indicate reduced fall risk for daily mobility.   Goal status: New   5.  Patient will demonstrate TUG < 15 seconds to indicate reduced fall risk for daily mobility.   Goal status: New   6.  Patient will demonstrate bilateral LE MMT 5/5 to facilitate transfers, ambulation.  Goal status: New   7.  Patient will demonstrate reciprocal gait pattern up/down stairs with hand rail and cane for household activity.  Goal Status: New  PLAN:  PT FREQUENCY: 2x/week  PT DURATION: 10 weeks  PLANNED INTERVENTIONS: Can include 16109- PT Re-evaluation, 97110-Therapeutic exercises, 97530- Therapeutic activity, 97112- Neuromuscular re-education, 97535- Self Care, [id number]- Manual therapy, [phone]- Gait training,[id number]- Electrical stimulation (unattended), 97750 Physical performance testing,    Patient/Family education, Balance training, Stair training, Taping, Dry Needling, Joint mobilization, Joint manipulation, Spinal manipulation, Spinal mobilization, Scar mobilization, Vestibular training, Visual/preceptual remediation/compensation, DME instructions, Cryotherapy, and Moist heat.  All performed as medically necessary.  All included unless contraindicated  PLAN FOR NEXT SESSION: Easier progression on strengthening, balance activity static/compliant surface.  Watch knee pain/fatigue.    Bonna Bustard, PT, DPT, OCS, ATC 08/03/23  2:32 PM   Referring diagnosis? M25.561,M25.562 (ICD-10-CM) - Acute pain of both knees Treatment diagnosis? (if different than referring diagnosis) M25.562, M25.561, M54.59, M62.81 What was this (referring dx) caused by? []  Surgery []  Fall [x]  Ongoing issue [x]  Arthritis []  Other: ____________  Laterality: []  Rt []  Lt [x]  Both  Check all possible CPT codes:  *CHOOSE 10 OR LESS*    See Planned Interventions listed in the Plan section of the Evaluation.

## 2023-08-11 ENCOUNTER — Ambulatory Visit: Payer: Self-pay | Admitting: Rehabilitative and Restorative Service Providers"

## 2023-08-11 ENCOUNTER — Encounter: Payer: Self-pay | Admitting: Rehabilitative and Restorative Service Providers"

## 2023-08-11 DIAGNOSIS — G8929 Other chronic pain: Secondary | ICD-10-CM

## 2023-08-11 DIAGNOSIS — M25561 Pain in right knee: Secondary | ICD-10-CM | POA: Diagnosis not present

## 2023-08-11 DIAGNOSIS — M5459 Other low back pain: Secondary | ICD-10-CM

## 2023-08-11 DIAGNOSIS — M25562 Pain in left knee: Secondary | ICD-10-CM | POA: Diagnosis not present

## 2023-08-11 DIAGNOSIS — R262 Difficulty in walking, not elsewhere classified: Secondary | ICD-10-CM

## 2023-08-11 DIAGNOSIS — M6281 Muscle weakness (generalized): Secondary | ICD-10-CM

## 2023-08-11 NOTE — Therapy (Signed)
 OUTPATIENT PHYSICAL THERAPY TREATMENT   Patient Name: Teresa French MRN: [id number] DOB:1940/04/20, 83 y.o., female Today's Date: 08/11/2023   END OF SESSION:  PT End of Session - 08/11/23 0943     Visit Number 12    Number of Visits 29    Date for PT Re-Evaluation 10/09/23    Authorization Type [employer]-auth submitted    Authorization Time Period 07/31/2023- 10/09/2023    Authorization - Visit Number 3    Authorization - Number of Visits 10    Progress Note Due on Visit 20   [employer] recert due   PT Start Time 0930    PT Stop Time 1009    PT Time Calculation (min) 39 min    Activity Tolerance Patient tolerated treatment well    Behavior During Therapy WFL for tasks assessed/performed               Past Medical History:  Diagnosis Date   Arthritis    Asthma    Borderline diabetes    Breast mass, left 07/10/2017   06/19/17: mammos: lobulated mass sup & lat to nipple; US : 1.4x0.5x1.0 cm  Bi-RADS4   Complication of anesthesia    "I SLEEP A VERY VERY LONG TIME"   Eczema    Environmental allergies    Glaucoma    History of transfusion    HTN (hypertension), benign 07/10/2017   Hyperlipidemia    Hypertension    Memory loss    Tingling    OF FEET   Past Surgical History:  Procedure Laterality Date   BREAST BIOPSY Bilateral    3-5 EACH BREAST   BREAST CYST ASPIRATION     CERVICAL LAMINECTOMY     DILATION AND CURETTAGE OF UTERUS     GANGLION CYST EXCISION     X3   JOINT REPLACEMENT  2007   left hip   TOTAL HIP ARTHROPLASTY Right 11/21/2014   Procedure: RIGHT TOTAL HIP ARTHROPLASTY ANTERIOR APPROACH;  Surgeon: Arnie Lao, MD;  Location: [facility];  Service: Orthopedics;  Laterality: Right;   Patient Active Problem List   Diagnosis Date Noted   Bronchiectasis (HCC) 10/28/2020   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   COVID-19 09/16/2020   Asthmatic bronchitis 09/08/2020   OSA (obstructive sleep apnea) 09/08/2020   DVT (deep venous  thrombosis) (HCC) 02/12/2019   Breast mass, left 07/10/2017   HTN (hypertension), benign 07/10/2017   Peripheral neuropathy 03/08/2016   Borderline diabetes    Rheumatoid arthritis (HCC) 10/29/2015   DJD (degenerative joint disease) 10/29/2015   Osteoporosis 10/29/2015   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   SUBJECTIVE:  SUBJECTIVE STATEMENT: Pt indicated feeling pretty good today without specific pain complaints.   PERTINENT HISTORY:  Medical history: arthritis, asthma, HTN, hyperlipidemia, memory loss.  Recently seen by [facility] for referral "unsteady gait". Today is a continuation of that treatment.   PAIN:  NPRS scale: no specific pain upon arrival.  Pain location: knees, back Pain description: sharp, achy, sore Aggravating factors: WB activity, standing for back Relieving factors: resting, sitting.  Takes medicine for pain  PRECAUTIONS: None  WEIGHT BEARING RESTRICTIONS: No  FALLS:  Has patient fallen in last 6 months? Yes. Number of falls 2 One fall was in the bathroom and then another in a parking lot.  LIVING ENVIRONMENT: Lives with: lives with their daughter and she lives in [place] and stays with daughter  here in [place] Lives in: House/apartment Stairs: 2 story home with bedroom on 2nd floor  Has following equipment at home: Single point cane  OCCUPATION: Retired  PLOF: Independent in daily activity.  Reported no specific hobbies.   PATIENT GOALS: Reduce pain, walk better.   OBJECTIVE:   PATIENT SURVEYS:  Patient-Specific Activity Scoring Scheme  "0" represents "unable to perform." "10" represents "able to perform at prior level. 0 1 2 3 4 5 6 7 8 9  10 (Date and Score)   Activity 07/31/2023    1. Standing prolonged  0    2. walking  3    3. stairs 2   4.chair transfers 2   5.    Score 1.75 avg    Total score = sum of the activity scores/number of activities Minimum detectable change (90%CI) for average score = 2 points Minimum detectable change (90%CI) for single activity score = 3 points  SCREENING FOR RED FLAGS: 07/31/2023 Bowel or bladder incontinence: No Cauda equina syndrome: No  COGNITION: 07/31/2023 Overall cognitive status: WFL normal      SENSATION: 07/31/2023 No specific testing today  MUSCLE LENGTH: 07/31/2023 No specific measurements.   POSTURE:  07/31/2023 rounded shoulders, forward head, decreased lumbar lordosis, increased thoracic kyphosis, and flexed trunk   PALPATION: 07/31/2023 No specific testing today  LOWER EXTREMITY ROM:      Right 07/31/2023 Left 07/31/2023  Hip flexion    Hip extension    Hip abduction    Hip adduction    Hip internal rotation    Hip external rotation    Knee flexion    Knee extension    Ankle dorsiflexion    Ankle plantarflexion    Ankle inversion    Ankle eversion     (Blank rows = not tested)  LOWER EXTREMITY MMT:    MMT Right 06/29/2023 Left 06/29/2023 Right 07/31/2023 Left 07/31/2023  Hip flexion 4/5 4/5 5/5 4/5  Hip extension      Hip abduction      Hip adduction      Hip internal rotation      Hip external rotation      Knee flexion 4/5 3+/5 5/5 5/5  Knee extension 4/5 3+/5 4/5 4/5  Ankle  dorsiflexion 4/5 3+/5 4/5 4/5  Ankle plantarflexion      Ankle inversion      Ankle eversion       (Blank rows = not tested)  SPECIAL TESTS:  07/31/2023 No specific testing today  FUNCTIONAL TESTS:  08/11/2023: 18 inch chair with foam transfers without UE   07/31/2023 TUG with SPC in Rt UE;  34 seconds    07/31/23 0001  Berg Balance Test  Sit to Stand  2  Standing Unsupported 4  Sitting with Back Unsupported but Feet Supported on Floor or Stool 4  Stand to Sit 2  Transfers 3  Standing Unsupported with Eyes Closed 4  Standing Unsupported with Feet Together 3  From Standing, Reach Forward with Outstretched Arm 1  From Standing Position, Pick up Object from Floor 1  From Standing Position, Turn to Look Behind Over each Shoulder 2  Turn 360 Degrees 1  Standing Unsupported, Alternately Place Feet on Step/Stool 0  Standing Unsupported, One Foot in Front 2  Standing on One Leg 1  Total Score 30    07/03/2023: BERG testing: 38/56  06/29/2023 eval: 5 times sit to stand: 45.62 sec with BUE support 10 meter walk test: 32.85 sec (1 ft/sec)   GAIT: 07/31/2023 Household distances in clinic c SPC in Rt hand.  Reduced step length noted bilaterally with forward head/trunk flexion, reduced gait speed and wider base of support.                                                                                                                                                                                                                    TODAY'S TREATMENT:                                                                                                         DATE: 08/11/2023  Therex: Nustep lvl 5 UE/LE for Rom, endurance 10 mins   TherActivity (to improve transfers, stairs, squatting) Step up 4 inch step x 10 bilateral with one hand on rail and CGA Sit to stand to sit 18 inch chair with foam airex pad no UE assist 2 x 5  Neuro Re-ed (balance, stability improvements, muscle activation for  postural control, etc) Alternating toe taps on 4 inch step x 10 bilateral with CGA, no UE assist on bar.  Feet together stance 1 min eyes open, 1 min eyes closed with SBA on foam pad Feet together stance on foam with slow head turns Lt and Rt x 10 with SBA to min A at  times Tandem stance 30 sec x 2 bilateral in SBA c occasional HHA on bar   TODAY'S TREATMENT:                                                                                                         DATE: 08/03/2023  Therex: Seated tband hip abduction clam shell x 15 bilateral green band without back support Seated marching green band alternating x 10 bilateral without back support.  Nustep lvl 5 UE/LE for Rom, endurance 10 mins   Neuro Re-ed (balance, stability improvements, muscle activation for postural control, etc) Feet together stance 1 min eyes open, 1 min eyes closed with SBA Tandem stance modified (foot forward step) 30 sec x 2 bilateral in SBA Standing slow marching x 10 bilateral with occasional min A from clinician to prevent LOB.  Fwd/back ambulation in // bars with SBA and minimal HHA 10 ft x 3 Lateral stepping 10 ft x 3 in // bars with minimal HHA   TODAY'S TREATMENT:                                                                                                         DATE: 07/31/2023  Therex: Nustep lvl 5 9 mins UE/LE Seated marching x 10 bilateral Seated LAQ x 10 bilateral Seated green band hip abduction clam shell x 5 bilateral  Review of existing HEP, updates.  Time spent in review.    Physical Performance Testing See berg testing, TUG testing above.  Time spent in education of techniques and performance.  Additional time required for completion of testing. Testing scoring listed above.  PATIENT EDUCATION:  Education details: HEP, POC updates Person educated: Patient Education method: Explanation, Demonstration, Verbal cues, and Handouts Education comprehension: verbalized understanding, returned  demonstration, and verbal cues required  HOME EXERCISE PROGRAM: Access Code: [email or web address] URL: [email or web address] Date: 07/31/2023 Prepared by: Bonna Bustard  Program Notes stop if pain occurs  Exercises - Seated Long Arc Quad  - 1-2 x daily - 7 x weekly - 1-2 sets - 10 reps - 2 hold - Seated Hamstring Curls with Resistance  - 1 x daily - 7 x weekly - 1-2 sets - 10 reps - Seated March  - 1-2 x daily - 7 x weekly - 1-2 sets - 10 reps - Sit to Stand with Counter Support  - 1 x daily - 5 x weekly - 2 sets - 5 reps - Seated Hip Abduction with Resistance  - 1 x daily - 7 x weekly - 1-2 sets - 10 reps   ASSESSMENT:  CLINICAL IMPRESSION: Continued challenges for static balance on compliant surface today  with fair to good control noted.  Continued LE weakness noted in WB movements that may benefit from continued strengthening as well as movement coordination control improvements.  Continued skilled PT services indicated at this time.   OBJECTIVE IMPAIRMENTS: Abnormal gait, decreased activity tolerance, decreased balance, decreased coordination, decreased endurance, decreased mobility, difficulty walking, decreased ROM, decreased strength, increased fascial restrictions, impaired perceived functional ability, impaired flexibility, impaired UE functional use, improper body mechanics, postural dysfunction, and pain.   ACTIVITY LIMITATIONS: carrying, lifting, bending, sitting, standing, squatting, stairs, transfers, bed mobility, reach over head, hygiene/grooming, and locomotion level  PARTICIPATION LIMITATIONS: meal prep, cleaning, laundry, interpersonal relationship, driving, shopping, and community activity  PERSONAL FACTORS: Medical history: arthritis, asthma, HTN, hyperlipidemia, memory loss, time since onset, multiple body parts involved are also affecting patient's functional outcome.   REHAB POTENTIAL: Fair to good  CLINICAL DECISION MAKING: Evolving/moderate  complexity  EVALUATION COMPLEXITY: Moderate   GOALS: Goals reviewed with patient? Yes  SHORT TERM GOALS: (target date for Short term goals are 3 weeks 08/21/2023)  1. Patient will demonstrate independent use of home exercise program to maintain progress from in clinic treatments.  Goal status: on going 08/03/2023  LONG TERM GOALS: (target dates for all long term goals are 10 weeks  10/09/2023 )   1. Patient will demonstrate/report pain at worst less than or equal to 2/10 to facilitate minimal limitation in daily activity secondary to pain symptoms.  Goal status: New   2. Patient will demonstrate independent use of home exercise program to facilitate ability to maintain/progress functional gains from skilled physical therapy services.  Goal status: New   3. Patient will demonstrate Patient specific functional scale avg > or = 7/10 to indicate reduced disability due to condition.   Goal status: New   4. Patient will demonstrate Randye Buttner testing > 45 to indicate reduced fall risk for daily mobility.   Goal status: New   5.  Patient will demonstrate TUG < 15 seconds to indicate reduced fall risk for daily mobility.   Goal status: New   6.  Patient will demonstrate bilateral LE MMT 5/5 to facilitate transfers, ambulation.  Goal status: New   7.  Patient will demonstrate reciprocal gait pattern up/down stairs with hand rail and cane for household activity.  Goal Status: New  PLAN:  PT FREQUENCY: 2x/week  PT DURATION: 10 weeks  PLANNED INTERVENTIONS: Can include 56213- PT Re-evaluation, 97110-Therapeutic exercises, 97530- Therapeutic activity, 97112- Neuromuscular re-education, 97535- Self Care, [id number]- Manual therapy, [phone]- Gait training,[id number]- Electrical stimulation (unattended), 97750 Physical performance testing,    Patient/Family education, Balance training, Stair training, Taping, Dry Needling, Joint mobilization, Joint manipulation, Spinal manipulation, Spinal mobilization,  Scar mobilization, Vestibular training, Visual/preceptual remediation/compensation, DME instructions, Cryotherapy, and Moist heat.  All performed as medically necessary.  All included unless contraindicated  PLAN FOR NEXT SESSION: Strengthening as able, avoid knee pain increased in WB  (maybe use leg press next time).  Balance improvements.    Bonna Bustard, PT, DPT, OCS, ATC 08/11/23  10:00 AM   Referring diagnosis? M25.561,M25.562 (ICD-10-CM) - Acute pain of both knees Treatment diagnosis? (if different than referring diagnosis) M25.562, M25.561, M54.59, M62.81 What was this (referring dx) caused by? []  Surgery []  Fall [x]  Ongoing issue [x]  Arthritis []  Other: ____________  Laterality: []  Rt []  Lt [x]  Both  Check all possible CPT codes:  *CHOOSE 10 OR LESS*    See Planned Interventions listed in the Plan section of the Evaluation.

## 2023-08-16 ENCOUNTER — Encounter: Payer: Self-pay | Admitting: Rehabilitative and Restorative Service Providers"

## 2023-08-16 ENCOUNTER — Ambulatory Visit: Admitting: Rehabilitative and Restorative Service Providers"

## 2023-08-16 DIAGNOSIS — M6281 Muscle weakness (generalized): Secondary | ICD-10-CM | POA: Diagnosis not present

## 2023-08-16 DIAGNOSIS — M25562 Pain in left knee: Secondary | ICD-10-CM

## 2023-08-16 DIAGNOSIS — M5459 Other low back pain: Secondary | ICD-10-CM | POA: Diagnosis not present

## 2023-08-16 DIAGNOSIS — G8929 Other chronic pain: Secondary | ICD-10-CM

## 2023-08-16 DIAGNOSIS — M25561 Pain in right knee: Secondary | ICD-10-CM | POA: Diagnosis not present

## 2023-08-16 DIAGNOSIS — R262 Difficulty in walking, not elsewhere classified: Secondary | ICD-10-CM

## 2023-08-16 NOTE — Therapy (Signed)
 OUTPATIENT PHYSICAL THERAPY TREATMENT   Patient Name: Teresa French MRN: [id number] DOB:07-08-1940, 83 y.o., female Today's Date: 08/16/2023   END OF SESSION:  PT End of Session - 08/16/23 1605     Visit Number 13    Number of Visits 29    Date for PT Re-Evaluation 10/09/23    Authorization Type [employer]-auth submitted    Authorization Time Period 07/31/2023- 10/09/2023    Authorization - Number of Visits 10    Progress Note Due on Visit 20   [employer] recert due   PT Start Time 1604    PT Stop Time 1650    PT Time Calculation (min) 46 min    Activity Tolerance Patient tolerated treatment well;No increased pain;Patient limited by pain    Behavior During Therapy [facility] for tasks assessed/performed                Past Medical History:  Diagnosis Date   Arthritis    Asthma    Borderline diabetes    Breast mass, left 07/10/2017   06/19/17: mammos: lobulated mass sup & lat to nipple; US : 1.4x0.5x1.0 cm  Bi-RADS4   Complication of anesthesia    "I SLEEP A VERY VERY LONG TIME"   Eczema    Environmental allergies    Glaucoma    History of transfusion    HTN (hypertension), benign 07/10/2017   Hyperlipidemia    Hypertension    Memory loss    Tingling    OF FEET   Past Surgical History:  Procedure Laterality Date   BREAST BIOPSY Bilateral    3-5 EACH BREAST   BREAST CYST ASPIRATION     CERVICAL LAMINECTOMY     DILATION AND CURETTAGE OF UTERUS     GANGLION CYST EXCISION     X3   JOINT REPLACEMENT  2007   left hip   TOTAL HIP ARTHROPLASTY Right 11/21/2014   Procedure: RIGHT TOTAL HIP ARTHROPLASTY ANTERIOR APPROACH;  Surgeon: Arnie Lao, MD;  Location: [facility];  Service: Orthopedics;  Laterality: Right;   Patient Active Problem List   Diagnosis Date Noted   Bronchiectasis (HCC) 10/28/2020   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   COVID-19 09/16/2020   Asthmatic bronchitis 09/08/2020   OSA (obstructive sleep apnea) 09/08/2020   DVT (deep  venous thrombosis) (HCC) 02/12/2019   Breast mass, left 07/10/2017   HTN (hypertension), benign 07/10/2017   Peripheral neuropathy 03/08/2016   Borderline diabetes    Rheumatoid arthritis (HCC) 10/29/2015   DJD (degenerative joint disease) 10/29/2015   Osteoporosis 10/29/2015   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   SUBJECTIVE:  SUBJECTIVE STATEMENT: Teresa French notes good early HEP compliance.  She is having some back pain with seated activities so we will modify these today.  PERTINENT HISTORY:  Medical history: arthritis, asthma, HTN, hyperlipidemia, memory loss.  Recently seen by [facility] for referral "unsteady gait". Today is a continuation of that treatment.   PAIN:  NPRS scale: 3-6/10 this week Pain location: knees, back Pain description: sharp, achy, sore Aggravating factors: WB activity, standing for back Relieving factors: resting, sitting.  Takes medicine for pain  PRECAUTIONS: None  WEIGHT BEARING RESTRICTIONS: No  FALLS:  Has patient fallen in last 6 months? Yes. Number of falls 2 One fall was in the bathroom and then another in a parking lot.  LIVING ENVIRONMENT: Lives with: lives with their daughter and she lives  in [place] and stays with daughter here in [place] Lives in: House/apartment Stairs: 2 story home with bedroom on 2nd floor  Has following equipment at home: Single point cane  OCCUPATION: Retired  PLOF: Independent in daily activity.  Reported no specific hobbies.   PATIENT GOALS: Reduce pain, walk better.   OBJECTIVE:   PATIENT SURVEYS:  Patient-Specific Activity Scoring Scheme  "0" represents "unable to perform." "10" represents "able to perform at prior level. 0 1 2 3 4 5 6 7 8 9  10 (Date and Score)   Activity 07/31/2023    1. Standing prolonged  0    2. walking  3    3. stairs 2   4.chair transfers 2   5.    Score 1.75 avg    Total score = sum of the activity scores/number of activities Minimum detectable change (90%CI) for average score = 2 points Minimum detectable change (90%CI) for single activity score = 3 points  SCREENING FOR RED FLAGS: 07/31/2023 Bowel or bladder incontinence: No Cauda equina syndrome: No  COGNITION: 07/31/2023 Overall cognitive status: WFL normal      SENSATION: 07/31/2023 No specific testing today  MUSCLE LENGTH: 07/31/2023 No specific measurements.   POSTURE:  07/31/2023 rounded shoulders, forward head, decreased lumbar lordosis, increased thoracic kyphosis, and flexed trunk   PALPATION: 07/31/2023 No specific testing today  LOWER EXTREMITY ROM:      Right 07/31/2023 Left 07/31/2023  Hip flexion    Hip extension    Hip abduction    Hip adduction    Hip internal rotation    Hip external rotation    Knee flexion    Knee extension    Ankle dorsiflexion    Ankle plantarflexion    Ankle inversion    Ankle eversion     (Blank rows = not tested)  LOWER EXTREMITY MMT:    MMT Right 06/29/2023 Left 06/29/2023 Right 07/31/2023 Left 07/31/2023  Hip flexion 4/5 4/5 5/5 4/5  Hip extension      Hip abduction      Hip adduction      Hip internal rotation      Hip external rotation      Knee flexion 4/5 3+/5 5/5  5/5  Knee extension 4/5 3+/5 4/5 4/5  Ankle dorsiflexion 4/5 3+/5 4/5 4/5  Ankle plantarflexion      Ankle inversion      Ankle eversion       (Blank rows = not tested)  SPECIAL TESTS:  07/31/2023 No specific testing today  FUNCTIONAL TESTS:  08/11/2023: 18 inch chair with foam transfers without UE   07/31/2023 TUG with SPC in Rt UE;  34 seconds    07/31/23 0001  Berg Balance Test  Sit to Stand 2  Standing Unsupported 4  Sitting with Back Unsupported but Feet Supported on Floor or Stool 4  Stand to Sit 2  Transfers 3  Standing Unsupported with Eyes Closed 4  Standing Unsupported with Feet Together 3  From Standing, Reach Forward with Outstretched Arm 1  From Standing Position, Pick up Object from Floor 1  From Standing Position, Turn to Look Behind Over each Shoulder 2  Turn 360 Degrees 1  Standing Unsupported, Alternately Place Feet on Step/Stool 0  Standing Unsupported, One Foot in Front 2  Standing on One Leg 1  Total Score 30    07/03/2023: BERG testing: 38/56  06/29/2023 eval: 5 times sit to stand: 45.62 sec with BUE support 10 meter walk test: 32.85 sec (1 ft/sec)   GAIT: 07/31/2023 Household distances in clinic c SPC in Rt hand.  Reduced step length noted bilaterally with forward head/trunk flexion, reduced gait speed and wider base of support.                                                                                                                                                                                                                   TODAY'S TREATMENT:                                                                                                         DATE: 08/16/2023  NuStep Level 5 Resistance with UE at 8 for 6 minutes Seated straight leg raises 3 sets of 5 for 3 seconds  Functional Activities: Double Leg Press 25# 12 reps with slow eccentrics Single Leg Press 12# Right and 6# Left full range 5 x each side with slow eccentrics   TODAY'S  TREATMENT:  DATE: 08/11/2023  Therex: Nustep lvl 5 UE/LE for Rom, endurance 10 mins   TherActivity (to improve transfers, stairs, squatting) Step up 4 inch step x 10 bilateral with one hand on rail and CGA Sit to stand to sit 18 inch chair with foam airex pad no UE assist 2 x 5  Neuro Re-ed (balance, stability improvements, muscle activation for postural control, etc) Alternating toe taps on 4 inch step x 10 bilateral with CGA, no UE assist on bar.  Feet together stance 1 min eyes open, 1 min eyes closed with SBA on foam pad Feet together stance on foam with slow head turns Lt and Rt x 10 with SBA to min A at times Tandem stance 30 sec x 2 bilateral in SBA c occasional HHA on bar   TODAY'S TREATMENT:                                                                                                         DATE: 08/03/2023  Therex: Seated tband hip abduction clam shell x 15 bilateral green band without back support Seated marching green band alternating x 10 bilateral without back support.  Nustep lvl 5 UE/LE for Rom, endurance 10 mins   Neuro Re-ed (balance, stability improvements, muscle activation for postural control, etc) Feet together stance 1 min eyes open, 1 min eyes closed with SBA Tandem stance modified (foot forward step) 30 sec x 2 bilateral in SBA Standing slow marching x 10 bilateral with occasional min A from clinician to prevent LOB.  Fwd/back ambulation in // bars with SBA and minimal HHA 10 ft x 3 Lateral stepping 10 ft x 3 in // bars with minimal HHA   Physical Performance Testing See berg testing, TUG testing above.  Time spent in education of techniques and performance.  Additional time required for completion of testing. Testing scoring listed above.  PATIENT EDUCATION:  Education details: HEP, POC updates Person educated: Patient Education method: Explanation,  Demonstration, Verbal cues, and Handouts Education comprehension: verbalized understanding, returned demonstration, and verbal cues required  HOME EXERCISE PROGRAM: Access Code: [email or web address] URL: [email or web address] Date: 08/16/2023 Prepared by: Terral Ferrari  Program Notes stop if pain occurs  Exercises - Seated Long Arc Quad  - 1-2 x daily - 7 x weekly - 1-2 sets - 10 reps - 2 hold - Seated Hamstring Curls with Resistance  - 1 x daily - 7 x weekly - 1-2 sets - 10 reps - Seated March  - 1-2 x daily - 7 x weekly - 1-2 sets - 10 reps - Sit to Stand with Counter Support  - 1 x daily - 5 x weekly - 2 sets - 5 reps - Seated Hip Abduction with Resistance  - 1 x daily - 7 x weekly - 1-2 sets - 10 reps - Small Range Straight Leg Raise  - 2 x daily - 7 x weekly - 3-5 sets - 5 reps - 3 seconds hold  ASSESSMENT:  CLINICAL IMPRESSION: Emaly reports good home exercise program compliance.  She did note some low back pain  with her seated exercises so I set her up on a seated straight leg raise with correct lumbar support and encouraged her to use her hands on the armrest of the chair.  She really felt her quadriceps during this exercise so I recommended she do this at home 30-50 reps a day.  We also tried some more functional activities with modified sit to stand and the leg press.  Sreeja certainly has more difficulty with weightbearing activities due to severely arthritic knees.  I encouraged her to refocus on her more non weight-bearing strengthening activities at home to improve strength and reduce joint pain.  OBJECTIVE IMPAIRMENTS: Abnormal gait, decreased activity tolerance, decreased balance, decreased coordination, decreased endurance, decreased mobility, difficulty walking, decreased ROM, decreased strength, increased fascial restrictions, impaired perceived functional ability, impaired flexibility, impaired UE functional use, improper body mechanics, postural dysfunction, and  pain.   ACTIVITY LIMITATIONS: carrying, lifting, bending, sitting, standing, squatting, stairs, transfers, bed mobility, reach over head, hygiene/grooming, and locomotion level  PARTICIPATION LIMITATIONS: meal prep, cleaning, laundry, interpersonal relationship, driving, shopping, and community activity  PERSONAL FACTORS: Medical history: arthritis, asthma, HTN, hyperlipidemia, memory loss, time since onset, multiple body parts involved are also affecting patient's functional outcome.   REHAB POTENTIAL: Fair to good  CLINICAL DECISION MAKING: Evolving/moderate complexity  EVALUATION COMPLEXITY: Moderate   GOALS: Goals reviewed with patient? Yes  SHORT TERM GOALS: (target date for Short term goals are 3 weeks 08/21/2023)  1. Patient will demonstrate independent use of home exercise program to maintain progress from in clinic treatments.  Goal status: on going 08/16/2023  LONG TERM GOALS: (target dates for all long term goals are 10 weeks  10/09/2023 )   1. Patient will demonstrate/report pain at worst less than or equal to 2/10 to facilitate minimal limitation in daily activity secondary to pain symptoms.  Goal status: Ongoing 08/16/2023   2. Patient will demonstrate independent use of home exercise program to facilitate ability to maintain/progress functional gains from skilled physical therapy services.  Goal status: New   3. Patient will demonstrate Patient specific functional scale avg > or = 7/10 to indicate reduced disability due to condition.   Goal status: New   4. Patient will demonstrate Randye Buttner testing > 45 to indicate reduced fall risk for daily mobility.   Goal status: New   5.  Patient will demonstrate TUG < 15 seconds to indicate reduced fall risk for daily mobility.   Goal status: New   6.  Patient will demonstrate bilateral LE MMT 5/5 to facilitate transfers, ambulation.  Goal status: Ongoing 08/16/2023   7.  Patient will demonstrate reciprocal gait pattern  up/down stairs with hand rail and cane for household activity.  Goal Status: Ongoing 08/16/2023  PLAN:  PT FREQUENCY: 2x/week  PT DURATION: 10 weeks  PLANNED INTERVENTIONS: Can include 91478- PT Re-evaluation, 97110-Therapeutic exercises, 97530- Therapeutic activity, 97112- Neuromuscular re-education, 97535- Self Care, [id number]- Manual therapy, [phone]- Gait training,[id number]- Electrical stimulation (unattended), 97750 Physical performance testing,    Patient/Family education, Balance training, Stair training, Taping, Dry Needling, Joint mobilization, Joint manipulation, Spinal manipulation, Spinal mobilization, Scar mobilization, Vestibular training, Visual/preceptual remediation/compensation, DME instructions, Cryotherapy, and Moist heat.  All performed as medically necessary.  All included unless contraindicated  PLAN FOR NEXT SESSION: Quadriceps strengthening while being careful to avoid overuse with weight-bearing.  Balance improvements.  Will need to be re-tested on the Berg at some point.   Joli Neas PT, MPT 08/16/23  4:58 PM   Referring diagnosis? M25.561,M25.562 (ICD-10-CM) -  Acute pain of both knees Treatment diagnosis? (if different than referring diagnosis) M25.562, M25.561, M54.59, M62.81 What was this (referring dx) caused by? []  Surgery []  Fall [x]  Ongoing issue [x]  Arthritis []  Other: ____________  Laterality: []  Rt []  Lt [x]  Both  Check all possible CPT codes:  *CHOOSE 10 OR LESS*    See Planned Interventions listed in the Plan section of the Evaluation.

## 2023-08-23 ENCOUNTER — Encounter: Payer: Self-pay | Admitting: Physical Therapy

## 2023-08-23 ENCOUNTER — Ambulatory Visit: Admitting: Physical Therapy

## 2023-08-23 DIAGNOSIS — M5459 Other low back pain: Secondary | ICD-10-CM | POA: Diagnosis not present

## 2023-08-23 DIAGNOSIS — M25561 Pain in right knee: Secondary | ICD-10-CM | POA: Diagnosis not present

## 2023-08-23 DIAGNOSIS — M6281 Muscle weakness (generalized): Secondary | ICD-10-CM | POA: Diagnosis not present

## 2023-08-23 DIAGNOSIS — G8929 Other chronic pain: Secondary | ICD-10-CM

## 2023-08-23 DIAGNOSIS — M25562 Pain in left knee: Secondary | ICD-10-CM | POA: Diagnosis not present

## 2023-08-23 NOTE — Therapy (Signed)
 OUTPATIENT PHYSICAL THERAPY TREATMENT   Patient Name: Teresa French MRN: [id number] DOB:02-10-1941, 83 y.o., female Today's Date: 08/23/2023   END OF SESSION:  PT End of Session - 08/23/23 1444     Visit Number 14    Number of Visits 29    Date for PT Re-Evaluation 10/09/23    Authorization Type [employer]-auth submitted    Authorization Time Period 07/31/2023- 10/09/2023    Authorization - Number of Visits 10    Progress Note Due on Visit 20   [employer] recert due   PT Start Time 1435   pt arrived late   PT Stop Time 1510    PT Time Calculation (min) 35 min    Activity Tolerance Patient tolerated treatment well;Patient limited by pain    Behavior During Therapy [facility] for tasks assessed/performed                 Past Medical History:  Diagnosis Date   Arthritis    Asthma    Borderline diabetes    Breast mass, left 07/10/2017   06/19/17: mammos: lobulated mass sup & lat to nipple; US : 1.4x0.5x1.0 cm  Bi-RADS4   Complication of anesthesia    "I SLEEP A VERY VERY LONG TIME"   Eczema    Environmental allergies    Glaucoma    History of transfusion    HTN (hypertension), benign 07/10/2017   Hyperlipidemia    Hypertension    Memory loss    Tingling    OF FEET   Past Surgical History:  Procedure Laterality Date   BREAST BIOPSY Bilateral    3-5 EACH BREAST   BREAST CYST ASPIRATION     CERVICAL LAMINECTOMY     DILATION AND CURETTAGE OF UTERUS     GANGLION CYST EXCISION     X3   JOINT REPLACEMENT  2007   left hip   TOTAL HIP ARTHROPLASTY Right 11/21/2014   Procedure: RIGHT TOTAL HIP ARTHROPLASTY ANTERIOR APPROACH;  Surgeon: Arnie Lao, MD;  Location: [facility];  Service: Orthopedics;  Laterality: Right;   Patient Active Problem List   Diagnosis Date Noted   Bronchiectasis (HCC) 10/28/2020   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   COVID-19 09/16/2020   Asthmatic bronchitis 09/08/2020   OSA (obstructive sleep apnea) 09/08/2020   DVT (deep  venous thrombosis) (HCC) 02/12/2019   Breast mass, left 07/10/2017   HTN (hypertension), benign 07/10/2017   Peripheral neuropathy 03/08/2016   Borderline diabetes    Rheumatoid arthritis (HCC) 10/29/2015   DJD (degenerative joint disease) 10/29/2015   Osteoporosis 10/29/2015   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   SUBJECTIVE:  SUBJECTIVE STATEMENT: "I can hardly walk from last session. I think it was the leg press."   PERTINENT HISTORY:  Medical history: arthritis, asthma, HTN, hyperlipidemia, memory loss.  Recently seen by [facility] for referral "unsteady gait". Today is a continuation of that treatment.   PAIN:  NPRS scale: 8/10  currently Pain location: knees, back Pain description: sharp, achy, sore Aggravating factors: WB activity, standing for back Relieving factors: resting, sitting.  Takes medicine for pain  PRECAUTIONS: None  WEIGHT BEARING RESTRICTIONS: No  FALLS:  Has patient fallen in last 6 months? Yes. Number of falls 2 One fall was in the bathroom and then another in a parking lot.  LIVING ENVIRONMENT: Lives with: lives with their daughter and she lives in [place] and stays with daughter here in [place] Lives in: House/apartment Stairs: 2 story  home with bedroom on 2nd floor  Has following equipment at home: Single point cane  OCCUPATION: Retired  PLOF: Independent in daily activity.  Reported no specific hobbies.   PATIENT GOALS: Reduce pain, walk better.   OBJECTIVE:   PATIENT SURVEYS:  Patient-Specific Activity Scoring Scheme  "0" represents "unable to perform." "10" represents "able to perform at prior level. 0 1 2 3 4 5 6 7 8 9  10 (Date and Score)   Activity 07/31/2023    1. Standing prolonged  0    2. walking  3    3. stairs 2   4.chair transfers 2   5.    Score 1.75 avg    Total score = sum of the activity scores/number of activities Minimum detectable change (90%CI) for average score = 2 points Minimum detectable change (90%CI) for single activity score = 3 points  SCREENING FOR RED FLAGS: 07/31/2023 Bowel or bladder incontinence: No Cauda equina syndrome: No  COGNITION: 07/31/2023 Overall cognitive status: WFL normal      SENSATION: 07/31/2023 No specific testing today  MUSCLE LENGTH: 07/31/2023 No specific measurements.   POSTURE:  07/31/2023 rounded shoulders, forward head, decreased lumbar lordosis, increased thoracic kyphosis, and flexed trunk   PALPATION: 07/31/2023 No specific testing today  LOWER EXTREMITY ROM:      Right 07/31/2023 Left 07/31/2023  Hip flexion    Hip extension    Hip abduction    Hip adduction    Hip internal rotation    Hip external rotation    Knee flexion    Knee extension    Ankle dorsiflexion    Ankle plantarflexion    Ankle inversion    Ankle eversion     (Blank rows = not tested)  LOWER EXTREMITY MMT:    MMT Right 06/29/2023 Left 06/29/2023 Right 07/31/2023 Left 07/31/2023  Hip flexion 4/5 4/5 5/5 4/5  Hip extension      Hip abduction      Hip adduction      Hip internal rotation      Hip external rotation      Knee flexion 4/5 3+/5 5/5 5/5  Knee extension 4/5 3+/5 4/5 4/5  Ankle dorsiflexion 4/5 3+/5 4/5 4/5  Ankle plantarflexion       Ankle inversion      Ankle eversion       (Blank rows = not tested)  SPECIAL TESTS:  07/31/2023 No specific testing today  FUNCTIONAL TESTS:  08/11/2023: 18 inch chair with foam transfers without UE   07/31/2023 TUG with SPC in Rt UE;  34 seconds    07/31/23 0001  Berg Balance Test  Sit to  Stand 2  Standing Unsupported 4  Sitting with Back Unsupported but Feet Supported on Floor or Stool 4  Stand to Sit 2  Transfers 3  Standing Unsupported with Eyes Closed 4  Standing Unsupported with Feet Together 3  From Standing, Reach Forward with Outstretched Arm 1  From Standing Position, Pick up Object from Floor 1  From Standing Position, Turn to Look Behind Over each Shoulder 2  Turn 360 Degrees 1  Standing Unsupported, Alternately Place Feet on Step/Stool 0  Standing Unsupported, One Foot in Front 2  Standing on One Leg 1  Total Score 30    07/03/2023: BERG testing: 38/56  06/29/2023 eval: 5 times sit to stand: 45.62 sec with BUE support 10 meter walk test: 32.85 sec (1 ft/sec)   GAIT: 07/31/2023 Household distances in clinic c SPC in Rt hand.  Reduced step length noted bilaterally with forward head/trunk flexion, reduced gait speed and wider base of support.                                                                                                                                                                                                                   TODAY'S TREATMENT 08/23/23 TherEx NuStep L3 x 8 min - decr resistance due to pain Seated LAQ 2# bil; 2x10 Seated marching with 2# bil; 2x10 Seated hip abduction L4 band 2x10 Seated hip adduction (ball squeeze) 2x10    08/16/2023  NuStep Level 5 Resistance with UE at 8 for 6 minutes Seated straight leg raises 3 sets of 5 for 3 seconds  Functional Activities: Double Leg Press 25# 12 reps with slow eccentrics Single Leg Press 12# Right and 6# Left full range 5 x each side with slow eccentrics   08/11/2023   Therex: Nustep lvl 5 UE/LE for Rom, endurance 10 mins   TherActivity (to improve transfers, stairs, squatting) Step up 4 inch step x 10 bilateral with one hand on rail and CGA Sit to stand to sit 18 inch chair with foam airex pad no UE assist 2 x 5  Neuro Re-ed (balance, stability improvements, muscle activation for postural control, etc) Alternating toe taps on 4 inch step x 10 bilateral with CGA, no UE assist on bar.  Feet together stance 1 min eyes open, 1 min eyes closed with SBA on foam pad Feet together stance on foam with slow head turns Lt and Rt x 10 with SBA to min A at times Tandem stance 30 sec x 2 bilateral in SBA c  occasional HHA on bar   08/03/2023  Therex: Seated tband hip abduction clam shell x 15 bilateral green band without back support Seated marching green band alternating x 10 bilateral without back support.  Nustep lvl 5 UE/LE for Rom, endurance 10 mins   Neuro Re-ed (balance, stability improvements, muscle activation for postural control, etc) Feet together stance 1 min eyes open, 1 min eyes closed with SBA Tandem stance modified (foot forward step) 30 sec x 2 bilateral in SBA Standing slow marching x 10 bilateral with occasional min A from clinician to prevent LOB.  Fwd/back ambulation in // bars with SBA and minimal HHA 10 ft x 3 Lateral stepping 10 ft x 3 in // bars with minimal HHA   Physical Performance Testing See berg testing, TUG testing above.  Time spent in education of techniques and performance.  Additional time required for completion of testing. Testing scoring listed above.  PATIENT EDUCATION:  Education details: HEP, POC updates Person educated: Patient Education method: Explanation, Demonstration, Verbal cues, and Handouts Education comprehension: verbalized understanding, returned demonstration, and verbal cues required  HOME EXERCISE PROGRAM: Access Code: [email or web address] URL: [email or web address] Date: 08/16/2023 Prepared  by: Terral Ferrari  Program Notes stop if pain occurs  Exercises - Seated Long Arc Quad  - 1-2 x daily - 7 x weekly - 1-2 sets - 10 reps - 2 hold - Seated Hamstring Curls with Resistance  - 1 x daily - 7 x weekly - 1-2 sets - 10 reps - Seated March  - 1-2 x daily - 7 x weekly - 1-2 sets - 10 reps - Sit to Stand with Counter Support  - 1 x daily - 5 x weekly - 2 sets - 5 reps - Seated Hip Abduction with Resistance  - 1 x daily - 7 x weekly - 1-2 sets - 10 reps - Small Range Straight Leg Raise  - 2 x daily - 7 x weekly - 3-5 sets - 5 reps - 3 seconds hold  ASSESSMENT:  CLINICAL IMPRESSION: Pt with increased pain today so limited to seated exercises only which pt tolerated okay.  Deferred BERG today due to pain. Will continue to benefit from PT to maximize function.  OBJECTIVE IMPAIRMENTS: Abnormal gait, decreased activity tolerance, decreased balance, decreased coordination, decreased endurance, decreased mobility, difficulty walking, decreased ROM, decreased strength, increased fascial restrictions, impaired perceived functional ability, impaired flexibility, impaired UE functional use, improper body mechanics, postural dysfunction, and pain.   ACTIVITY LIMITATIONS: carrying, lifting, bending, sitting, standing, squatting, stairs, transfers, bed mobility, reach over head, hygiene/grooming, and locomotion level  PARTICIPATION LIMITATIONS: meal prep, cleaning, laundry, interpersonal relationship, driving, shopping, and community activity  PERSONAL FACTORS: Medical history: arthritis, asthma, HTN, hyperlipidemia, memory loss, time since onset, multiple body parts involved are also affecting patient's functional outcome.   REHAB POTENTIAL: Fair to good  CLINICAL DECISION MAKING: Evolving/moderate complexity  EVALUATION COMPLEXITY: Moderate   GOALS: Goals reviewed with patient? Yes  SHORT TERM GOALS: (target date for Short term goals are 3 weeks 08/21/2023)  1. Patient will demonstrate  independent use of home exercise program to maintain progress from in clinic treatments.  Goal status: on going 08/16/2023  LONG TERM GOALS: (target dates for all long term goals are 10 weeks  10/09/2023 )   1. Patient will demonstrate/report pain at worst less than or equal to 2/10 to facilitate minimal limitation in daily activity secondary to pain symptoms.  Goal status: Ongoing 08/16/2023   2. Patient will demonstrate independent use of  home exercise program to facilitate ability to maintain/progress functional gains from skilled physical therapy services.  Goal status: New   3. Patient will demonstrate Patient specific functional scale avg > or = 7/10 to indicate reduced disability due to condition.   Goal status: New   4. Patient will demonstrate Randye Buttner testing > 45 to indicate reduced fall risk for daily mobility.   Goal status: New   5.  Patient will demonstrate TUG < 15 seconds to indicate reduced fall risk for daily mobility.   Goal status: New   6.  Patient will demonstrate bilateral LE MMT 5/5 to facilitate transfers, ambulation.  Goal status: Ongoing 08/16/2023   7.  Patient will demonstrate reciprocal gait pattern up/down stairs with hand rail and cane for household activity.  Goal Status: Ongoing 08/16/2023  PLAN:  PT FREQUENCY: 2x/week  PT DURATION: 10 weeks  PLANNED INTERVENTIONS: Can include 82956- PT Re-evaluation, 97110-Therapeutic exercises, 97530- Therapeutic activity, 97112- Neuromuscular re-education, 97535- Self Care, [id number]- Manual therapy, [phone]- Gait training,[id number]- Electrical stimulation (unattended), [id number] Physical performance testing,    Patient/Family education, Balance training, Stair training, Taping, Dry Needling, Joint mobilization, Joint manipulation, Spinal manipulation, Spinal mobilization, Scar mobilization, Vestibular training, Visual/preceptual remediation/compensation, DME instructions, Cryotherapy, and Moist heat.  All performed as medically  necessary.  All included unless contraindicated  PLAN FOR NEXT SESSION: see how pain is, Quadriceps strengthening while being careful to avoid overuse with weight-bearing.  Balance improvements.  Will need to be re-tested on the Berg at some point.   Marley Simmers, PT, DPT 08/23/23 3:13 PM    Referring diagnosis? M25.561,M25.562 (ICD-10-CM) - Acute pain of both knees Treatment diagnosis? (if different than referring diagnosis) M25.562, M25.561, M54.59, M62.81 What was this (referring dx) caused by? []  Surgery []  Fall [x]  Ongoing issue [x]  Arthritis []  Other: ____________  Laterality: []  Rt []  Lt [x]  Both  Check all possible CPT codes:  *CHOOSE 10 OR LESS*    See Planned Interventions listed in the Plan section of the Evaluation.

## 2023-08-24 ENCOUNTER — Ambulatory Visit
Admission: RE | Admit: 2023-08-24 | Discharge: 2023-08-24 | Disposition: A | Discharge status: Home / Self Care | Payer: Medicare PPO | Source: Ambulatory Visit | Attending: Internal Medicine | Admitting: Internal Medicine

## 2023-08-24 DIAGNOSIS — R918 Other nonspecific abnormal finding of lung field: Secondary | ICD-10-CM

## 2023-08-25 ENCOUNTER — Encounter: Payer: Self-pay | Admitting: Physical Therapy

## 2023-08-25 ENCOUNTER — Ambulatory Visit: Admitting: Family

## 2023-08-25 ENCOUNTER — Ambulatory Visit (INDEPENDENT_AMBULATORY_CARE_PROVIDER_SITE_OTHER): Payer: Self-pay

## 2023-08-25 ENCOUNTER — Encounter: Payer: Self-pay | Admitting: Family

## 2023-08-25 ENCOUNTER — Ambulatory Visit (INDEPENDENT_AMBULATORY_CARE_PROVIDER_SITE_OTHER): Admitting: Physical Therapy

## 2023-08-25 DIAGNOSIS — M25572 Pain in left ankle and joints of left foot: Secondary | ICD-10-CM | POA: Diagnosis not present

## 2023-08-25 DIAGNOSIS — M545 Low back pain, unspecified: Secondary | ICD-10-CM

## 2023-08-25 DIAGNOSIS — M6281 Muscle weakness (generalized): Secondary | ICD-10-CM

## 2023-08-25 DIAGNOSIS — M25561 Pain in right knee: Secondary | ICD-10-CM | POA: Diagnosis not present

## 2023-08-25 DIAGNOSIS — M25562 Pain in left knee: Secondary | ICD-10-CM

## 2023-08-25 DIAGNOSIS — G8929 Other chronic pain: Secondary | ICD-10-CM

## 2023-08-25 DIAGNOSIS — M51361 Other intervertebral disc degeneration, lumbar region with lower extremity pain only: Secondary | ICD-10-CM | POA: Diagnosis not present

## 2023-08-25 DIAGNOSIS — M5459 Other low back pain: Secondary | ICD-10-CM | POA: Diagnosis not present

## 2023-08-25 MED ORDER — PREDNISONE 50 MG PO TABS: ORAL_TABLET | ORAL | 0 refills | Status: DC

## 2023-08-25 NOTE — Therapy (Signed)
 OUTPATIENT PHYSICAL THERAPY TREATMENT   Patient Name: Teresa French MRN: [id number] DOB:01-01-1941, 83 y.o., female Today's Date: 08/25/2023   END OF SESSION:  PT End of Session - 08/25/23 1107     Visit Number 15    Number of Visits 29    Date for PT Re-Evaluation 10/09/23    Authorization Type [employer]-auth submitted    Authorization Time Period 07/31/2023- 10/09/2023    Authorization - Number of Visits 10    Progress Note Due on Visit 20   [employer] recert due   PT Start Time 1105    PT Stop Time 1143    PT Time Calculation (min) 38 min    Activity Tolerance Patient tolerated treatment well;Patient limited by pain    Behavior During Therapy [facility] for tasks assessed/performed                  Past Medical History:  Diagnosis Date   Arthritis    Asthma    Borderline diabetes    Breast mass, left 07/10/2017   06/19/17: mammos: lobulated mass sup & lat to nipple; US : 1.4x0.5x1.0 cm  Bi-RADS4   Complication of anesthesia    "I SLEEP A VERY VERY LONG TIME"   Eczema    Environmental allergies    Glaucoma    History of transfusion    HTN (hypertension), benign 07/10/2017   Hyperlipidemia    Hypertension    Memory loss    Tingling    OF FEET   Past Surgical History:  Procedure Laterality Date   BREAST BIOPSY Bilateral    3-5 EACH BREAST   BREAST CYST ASPIRATION     CERVICAL LAMINECTOMY     DILATION AND CURETTAGE OF UTERUS     GANGLION CYST EXCISION     X3   JOINT REPLACEMENT  2007   left hip   TOTAL HIP ARTHROPLASTY Right 11/21/2014   Procedure: RIGHT TOTAL HIP ARTHROPLASTY ANTERIOR APPROACH;  Surgeon: Arnie Lao, MD;  Location: [facility];  Service: Orthopedics;  Laterality: Right;   Patient Active Problem List   Diagnosis Date Noted   Bronchiectasis (HCC) 10/28/2020   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   COVID-19 09/16/2020   Asthmatic bronchitis 09/08/2020   OSA (obstructive sleep apnea) 09/08/2020   DVT (deep venous thrombosis)  (HCC) 02/12/2019   Breast mass, left 07/10/2017   HTN (hypertension), benign 07/10/2017   Peripheral neuropathy 03/08/2016   Borderline diabetes    Rheumatoid arthritis (HCC) 10/29/2015   DJD (degenerative joint disease) 10/29/2015   Osteoporosis 10/29/2015   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   SUBJECTIVE:  SUBJECTIVE STATEMENT: Saw Erin today; "she told me she was going to tell you to take it easy on me." Pain was worse yesterday but not as bad today.     PERTINENT HISTORY:  Medical history: arthritis, asthma, HTN, hyperlipidemia, memory loss.  Recently seen by [facility] for referral "unsteady gait". Today is a continuation of that treatment.   PAIN:  NPRS scale: 9/10  currently Pain location: knees, back Pain description: sharp, achy, sore Aggravating factors: WB activity, standing for back Relieving factors: resting, sitting.  Takes medicine for pain  PRECAUTIONS: None  WEIGHT BEARING RESTRICTIONS: No  FALLS:  Has patient fallen in last 6 months? Yes. Number of falls 2 One fall was in the bathroom and then another in a parking lot.  LIVING ENVIRONMENT: Lives with: lives with their daughter and she lives in [place] and stays with daughter here in  [place] Lives in: House/apartment Stairs: 2 story home with bedroom on 2nd floor  Has following equipment at home: Single point cane  OCCUPATION: Retired  PLOF: Independent in daily activity.  Reported no specific hobbies.   PATIENT GOALS: Reduce pain, walk better.   OBJECTIVE:   PATIENT SURVEYS:  Patient-Specific Activity Scoring Scheme  "0" represents "unable to perform." "10" represents "able to perform at prior level. 0 1 2 3 4 5 6 7 8 9  10 (Date and Score)   Activity 07/31/2023    1. Standing prolonged  0    2. walking  3    3. stairs 2   4.chair transfers 2   5.    Score 1.75 avg    Total score = sum of the activity scores/number of activities Minimum detectable change (90%CI) for average score = 2 points Minimum detectable change (90%CI) for single activity score = 3 points  SCREENING FOR RED FLAGS: 07/31/2023 Bowel or bladder incontinence: No Cauda equina syndrome: No  COGNITION: 07/31/2023 Overall cognitive status: WFL normal      SENSATION: 07/31/2023 No specific testing today  MUSCLE LENGTH: 07/31/2023 No specific measurements.   POSTURE:  07/31/2023 rounded shoulders, forward head, decreased lumbar lordosis, increased thoracic kyphosis, and flexed trunk   PALPATION: 07/31/2023 No specific testing today  LOWER EXTREMITY ROM:      Right 07/31/2023 Left 07/31/2023  Hip flexion    Hip extension    Hip abduction    Hip adduction    Hip internal rotation    Hip external rotation    Knee flexion    Knee extension    Ankle dorsiflexion    Ankle plantarflexion    Ankle inversion    Ankle eversion     (Blank rows = not tested)  LOWER EXTREMITY MMT:    MMT Right 06/29/2023 Left 06/29/2023 Right 07/31/2023 Left 07/31/2023  Hip flexion 4/5 4/5 5/5 4/5  Hip extension      Hip abduction      Hip adduction      Hip internal rotation      Hip external rotation      Knee flexion 4/5 3+/5 5/5 5/5  Knee extension 4/5 3+/5 4/5 4/5  Ankle  dorsiflexion 4/5 3+/5 4/5 4/5  Ankle plantarflexion      Ankle inversion      Ankle eversion       (Blank rows = not tested)  SPECIAL TESTS:  07/31/2023 No specific testing today  FUNCTIONAL TESTS:  08/11/2023: 18 inch chair with foam transfers without UE   07/31/2023 TUG with SPC in Rt UE;  34 seconds    07/31/23 0001  Berg Balance Test  Sit to Stand 2  Standing Unsupported 4  Sitting with Back Unsupported but Feet Supported on Floor or Stool 4  Stand to Sit 2  Transfers 3  Standing Unsupported with Eyes Closed 4  Standing Unsupported with Feet Together 3  From Standing, Reach Forward with Outstretched Arm 1  From Standing Position, Pick up Object from Floor 1  From Standing Position, Turn to Look Behind Over each Shoulder 2  Turn 360 Degrees 1  Standing Unsupported, Alternately Place Feet on Step/Stool 0  Standing Unsupported, One Foot in Front 2  Standing on One Leg 1  Total Score 30    07/03/2023: BERG testing: 38/56  06/29/2023 eval: 5 times sit to stand: 45.62 sec with BUE support 10 meter walk test: 32.85 sec (1 ft/sec)   GAIT: 07/31/2023 Household distances in clinic c SPC in Rt hand.  Reduced step length noted bilaterally with forward head/trunk flexion, reduced gait speed and wider base of support.                                                                                                                                                                                                                   TODAY'S TREATMENT 08/25/23 TherEx Seated LAQ 2# bil; 2x10 Seated marching with 2# bil; 2x10 Seated hip abduction L4 band 2x10 Seated hamstring curls L4 band 2x10 Seated hip adduction (ball squeeze) 2x10 Isometric opp arm/leg hip flexion 2x10 bil NuStep L4 x 8 min   08/23/23 TherEx NuStep L3 x 8 min - decr resistance due to pain Seated LAQ 2# bil; 2x10 Seated marching with 2# bil; 2x10 Seated hip abduction L4 band 2x10 Seated hip adduction (ball  squeeze) 2x10    08/16/2023  NuStep Level 5 Resistance with UE at 8 for 6 minutes Seated straight leg raises 3 sets of 5 for 3 seconds  Functional Activities: Double Leg Press 25# 12 reps with slow eccentrics Single Leg Press 12# Right and 6# Left full range 5 x each side with slow eccentrics   08/11/2023  Therex: Nustep lvl 5 UE/LE for Rom, endurance 10 mins   TherActivity (to improve transfers, stairs, squatting) Step up 4 inch step x 10 bilateral with one hand on rail and CGA Sit to stand to sit 18 inch chair with foam airex pad no UE assist 2 x 5  Neuro Re-ed (balance, stability improvements, muscle activation for postural control, etc) Alternating toe taps on 4 inch  step x 10 bilateral with CGA, no UE assist on bar.  Feet together stance 1 min eyes open, 1 min eyes closed with SBA on foam pad Feet together stance on foam with slow head turns Lt and Rt x 10 with SBA to min A at times Tandem stance 30 sec x 2 bilateral in SBA c occasional HHA on bar   08/03/2023  Therex: Seated tband hip abduction clam shell x 15 bilateral green band without back support Seated marching green band alternating x 10 bilateral without back support.  Nustep lvl 5 UE/LE for Rom, endurance 10 mins   Neuro Re-ed (balance, stability improvements, muscle activation for postural control, etc) Feet together stance 1 min eyes open, 1 min eyes closed with SBA Tandem stance modified (foot forward step) 30 sec x 2 bilateral in SBA Standing slow marching x 10 bilateral with occasional min A from clinician to prevent LOB.  Fwd/back ambulation in // bars with SBA and minimal HHA 10 ft x 3 Lateral stepping 10 ft x 3 in // bars with minimal HHA   Physical Performance Testing See berg testing, TUG testing above.  Time spent in education of techniques and performance.  Additional time required for completion of testing. Testing scoring listed above.  PATIENT EDUCATION:  Education details: HEP, POC  updates Person educated: Patient Education method: Explanation, Demonstration, Verbal cues, and Handouts Education comprehension: verbalized understanding, returned demonstration, and verbal cues required  HOME EXERCISE PROGRAM: Access Code: [email or web address] URL: [email or web address] Date: 08/16/2023 Prepared by: Terral Ferrari  Program Notes stop if pain occurs  Exercises - Seated Long Arc Quad  - 1-2 x daily - 7 x weekly - 1-2 sets - 10 reps - 2 hold - Seated Hamstring Curls with Resistance  - 1 x daily - 7 x weekly - 1-2 sets - 10 reps - Seated March  - 1-2 x daily - 7 x weekly - 1-2 sets - 10 reps - Sit to Stand with Counter Support  - 1 x daily - 5 x weekly - 2 sets - 5 reps - Seated Hip Abduction with Resistance  - 1 x daily - 7 x weekly - 1-2 sets - 10 reps - Small Range Straight Leg Raise  - 2 x daily - 7 x weekly - 3-5 sets - 5 reps - 3 seconds hold  ASSESSMENT:  CLINICAL IMPRESSION: Still deferred BERG today due to elevated pain, but did see provider today due to symptoms.  Will continue to benefit from PT to maximize function.  OBJECTIVE IMPAIRMENTS: Abnormal gait, decreased activity tolerance, decreased balance, decreased coordination, decreased endurance, decreased mobility, difficulty walking, decreased ROM, decreased strength, increased fascial restrictions, impaired perceived functional ability, impaired flexibility, impaired UE functional use, improper body mechanics, postural dysfunction, and pain.   ACTIVITY LIMITATIONS: carrying, lifting, bending, sitting, standing, squatting, stairs, transfers, bed mobility, reach over head, hygiene/grooming, and locomotion level  PARTICIPATION LIMITATIONS: meal prep, cleaning, laundry, interpersonal relationship, driving, shopping, and community activity  PERSONAL FACTORS: Medical history: arthritis, asthma, HTN, hyperlipidemia, memory loss, time since onset, multiple body parts involved are also affecting patient's  functional outcome.   REHAB POTENTIAL: Fair to good  CLINICAL DECISION MAKING: Evolving/moderate complexity  EVALUATION COMPLEXITY: Moderate   GOALS: Goals reviewed with patient? Yes  SHORT TERM GOALS: (target date for Short term goals are 3 weeks 08/21/2023)  1. Patient will demonstrate independent use of home exercise program to maintain progress from in clinic treatments.  Goal status: on going 08/16/2023  LONG TERM  GOALS: (target dates for all long term goals are 10 weeks  10/09/2023 )   1. Patient will demonstrate/report pain at worst less than or equal to 2/10 to facilitate minimal limitation in daily activity secondary to pain symptoms.  Goal status: Ongoing 08/16/2023   2. Patient will demonstrate independent use of home exercise program to facilitate ability to maintain/progress functional gains from skilled physical therapy services.  Goal status: New   3. Patient will demonstrate Patient specific functional scale avg > or = 7/10 to indicate reduced disability due to condition.   Goal status: New   4. Patient will demonstrate Randye Buttner testing > 45 to indicate reduced fall risk for daily mobility.   Goal status: New   5.  Patient will demonstrate TUG < 15 seconds to indicate reduced fall risk for daily mobility.   Goal status: New   6.  Patient will demonstrate bilateral LE MMT 5/5 to facilitate transfers, ambulation.  Goal status: Ongoing 08/16/2023   7.  Patient will demonstrate reciprocal gait pattern up/down stairs with hand rail and cane for household activity.  Goal Status: Ongoing 08/16/2023  PLAN:  PT FREQUENCY: 2x/week  PT DURATION: 10 weeks  PLANNED INTERVENTIONS: Can include 19147- PT Re-evaluation, 97110-Therapeutic exercises, 97530- Therapeutic activity, 97112- Neuromuscular re-education, 97535- Self Care, 97140- Manual therapy, [phone]- Gait training,[id number]- Electrical stimulation (unattended), 97750 Physical performance testing,    Patient/Family  education, Balance training, Stair training, Taping, Dry Needling, Joint mobilization, Joint manipulation, Spinal manipulation, Spinal mobilization, Scar mobilization, Vestibular training, Visual/preceptual remediation/compensation, DME instructions, Cryotherapy, and Moist heat.  All performed as medically necessary.  All included unless contraindicated  PLAN FOR NEXT SESSION: continue Quadriceps strengthening while being careful to avoid overuse with weight-bearing.  Balance improvements.  Will need to be re-tested on the Berg at some point.   Marley Simmers, PT, DPT 08/25/23 11:36 AM    Referring diagnosis? M25.561,M25.562 (ICD-10-CM) - Acute pain of both knees Treatment diagnosis? (if different than referring diagnosis) M25.562, M25.561, M54.59, M62.81 What was this (referring dx) caused by? []  Surgery []  Fall [x]  Ongoing issue [x]  Arthritis []  Other: ____________  Laterality: []  Rt []  Lt [x]  Both  Check all possible CPT codes:  *CHOOSE 10 OR LESS*    See Planned Interventions listed in the Plan section of the Evaluation.

## 2023-08-25 NOTE — Progress Notes (Signed)
 Office Visit Note   Patient: Teresa French           Date of Birth: [birth year]           MRN: [id number] Visit Date: 08/25/2023              Requested by: Davida Espy, MD [phone] [address]  [place],  [place] [address] PCP: Davida Espy, MD  Chief Complaint  Patient presents with   Left Foot - Pain      HPI: The patient is an 83 year old woman who presents today complaining of new anterior ankle pain on the left.  She is currently in physical therapy for her knee pain.  She reports at her recent physical therapy visit she was doing some leg lifts with her quads and began having pain in the lower extremity radiating from the knee to the anterior ankle laterally.  This has been constant waxing and waning for the last days to weeks.  She does have a history of lumbar radiculopathy and degenerative disc disease however this set of symptoms is new to her  No red flag symptoms  Assessment & Plan: Visit Diagnoses:  1. Chronic midline low back pain without sciatica   2. Pain in left ankle and joints of left foot     Plan: Will trial her on a prednisone burst.  Follow-Up Instructions: No follow-ups on file.   Left Ankle Exam   Tenderness  Left ankle tenderness location: Anterior joint line.  Swelling: moderate  Range of Motion  The patient has normal left ankle ROM.   Muscle Strength  The patient has normal left ankle strength.  Other  Erythema: absent Pulse: present   Back Exam   Tenderness  The patient is experiencing no tenderness.   Tests  Straight leg raise right: negative Straight leg raise left: positive      Patient is alert, oriented, no adenopathy, well-dressed, normal affect, normal respiratory effort.     Imaging: No results found. No images are attached to the encounter.  Labs: Lab Results  Component Value Date   HGBA1C 6.8 (H) 06/06/2023   HGBA1C 6.2 (H) 01/23/2018   HGBA1C 5.6 10/29/2015   GRAMSTAIN Gram positive cocci  in chains (A) 11/03/2020   LABORGA Normal Oropharyngeal Flora 10/18/2016     Lab Results  Component Value Date   ALBUMIN 4.3 04/08/2022   ALBUMIN 4.5 07/10/2017   ALBUMIN 3.9 03/08/2016    No results found for: "MG" No results found for: "VD25OH"  No results found for: "PREALBUMIN"    Latest Ref Rng & Units 06/06/2023    3:17 PM 04/08/2022    2:06 PM 01/23/2018    3:07 PM  CBC EXTENDED  WBC 4.0 - 10.5 K/uL 5.2  8.7  5.1   RBC 3.87 - 5.11 Mil/uL 3.95  4.28  4.32   Hemoglobin 12.0 - 15.0 g/dL 47.8  29.5  62.1   HCT 36.0 - 46.0 % 34.0  37.3  37.0   Platelets 150.0 - 400.0 K/uL 230.0  277.0  229   NEUT# 1.4 - 7.7 K/uL 3.3  5.8  2,341   Lymph# 0.7 - 4.0 K/uL 1.4  2.1  1,918      There is no height or weight on file to calculate BMI.  Orders:  Orders Placed This Encounter  Procedures   XR Lumbar Spine 2-3 Views   No orders of the defined types were placed in this encounter.    Procedures: No  procedures performed  Clinical Data: No additional findings.  ROS:  All other systems negative, except as noted in the HPI. Review of Systems  Objective: Vital Signs: There were no vitals taken for this visit.  Specialty Comments:  No specialty comments available.  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   Osteoarthritis of right hip 11/21/2014   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   Past Medical  History:  Diagnosis Date   Arthritis    Asthma    Borderline diabetes    Breast mass, left 07/10/2017   06/19/17: mammos: lobulated mass sup & lat to nipple; US : 1.4x0.5x1.0 cm  Bi-RADS4   Complication of anesthesia    "I SLEEP A VERY VERY LONG TIME"   Eczema    Environmental allergies    Glaucoma    History of transfusion    HTN (hypertension), benign 07/10/2017   Hyperlipidemia    Hypertension    Memory loss    Tingling    OF FEET    Family History  Problem Relation Age of Onset   Heart disease Mother    Lung cancer Father    Breast cancer Paternal Aunt        41s   Heart disease Maternal Grandmother    Lung cancer Maternal Grandfather    Hypertension Maternal Grandfather    Breast cancer Cousin 82   Breast cancer Cousin 40    Past Surgical History:  Procedure Laterality Date   BREAST BIOPSY Bilateral    3-5 EACH BREAST   BREAST CYST ASPIRATION     CERVICAL LAMINECTOMY     DILATION AND CURETTAGE OF UTERUS     GANGLION CYST EXCISION     X3   JOINT REPLACEMENT  2007   left hip   TOTAL HIP ARTHROPLASTY Right 11/21/2014   Procedure: RIGHT TOTAL HIP ARTHROPLASTY ANTERIOR APPROACH;  Surgeon: Arnie Lao, MD;  Location: [facility];  Service: Orthopedics;  Laterality: Right;   Social History   Occupational History   Not on file  Tobacco Use   Smoking status: Never   Smokeless tobacco: Never  Vaping Use   Vaping status: Never Used  Substance and Sexual Activity   Alcohol use: No   Drug use: No   Sexual activity: Not on file

## 2023-08-29 ENCOUNTER — Ambulatory Visit: Admitting: Rehabilitative and Restorative Service Providers"

## 2023-08-29 ENCOUNTER — Encounter: Payer: Self-pay | Admitting: Rehabilitative and Restorative Service Providers"

## 2023-08-29 DIAGNOSIS — M6281 Muscle weakness (generalized): Secondary | ICD-10-CM | POA: Diagnosis not present

## 2023-08-29 DIAGNOSIS — G8929 Other chronic pain: Secondary | ICD-10-CM

## 2023-08-29 DIAGNOSIS — M5459 Other low back pain: Secondary | ICD-10-CM | POA: Diagnosis not present

## 2023-08-29 DIAGNOSIS — R262 Difficulty in walking, not elsewhere classified: Secondary | ICD-10-CM

## 2023-08-29 DIAGNOSIS — M25562 Pain in left knee: Secondary | ICD-10-CM | POA: Diagnosis not present

## 2023-08-29 DIAGNOSIS — M25561 Pain in right knee: Secondary | ICD-10-CM

## 2023-08-29 NOTE — Therapy (Signed)
 OUTPATIENT PHYSICAL THERAPY TREATMENT   Patient Name: Teresa French MRN: [id number] DOB:1940/04/15, 83 y.o., female Today's Date: 08/29/2023   END OF SESSION:  PT End of Session - 08/29/23 1358     Visit Number 16    Number of Visits 29    Date for PT Re-Evaluation 10/09/23    Authorization Type [employer]-auth submitted    Authorization Time Period 07/31/2023- 10/09/2023    Authorization - Visit Number 7    Authorization - Number of Visits 10    Progress Note Due on Visit 20    PT Start Time 1353    PT Stop Time 1431    PT Time Calculation (min) 38 min    Activity Tolerance Patient limited by pain;Patient limited by fatigue    Behavior During Therapy [facility] for tasks assessed/performed                   Past Medical History:  Diagnosis Date   Arthritis    Asthma    Borderline diabetes    Breast mass, left 07/10/2017   06/19/17: mammos: lobulated mass sup & lat to nipple; US : 1.4x0.5x1.0 cm  Bi-RADS4   Complication of anesthesia    "I SLEEP A VERY VERY LONG TIME"   Eczema    Environmental allergies    Glaucoma    History of transfusion    HTN (hypertension), benign 07/10/2017   Hyperlipidemia    Hypertension    Memory loss    Tingling    OF FEET   Past Surgical History:  Procedure Laterality Date   BREAST BIOPSY Bilateral    3-5 EACH BREAST   BREAST CYST ASPIRATION     CERVICAL LAMINECTOMY     DILATION AND CURETTAGE OF UTERUS     GANGLION CYST EXCISION     X3   JOINT REPLACEMENT  2007   left hip   TOTAL HIP ARTHROPLASTY Right 11/21/2014   Procedure: RIGHT TOTAL HIP ARTHROPLASTY ANTERIOR APPROACH;  Surgeon: Arnie Lao, MD;  Location: [facility];  Service: Orthopedics;  Laterality: Right;   Patient Active Problem List   Diagnosis Date Noted   Bronchiectasis (HCC) 10/28/2020   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   COVID-19 09/16/2020   Asthmatic bronchitis 09/08/2020   OSA (obstructive sleep apnea) 09/08/2020   DVT (deep venous  thrombosis) (HCC) 02/12/2019   Breast mass, left 07/10/2017   HTN (hypertension), benign 07/10/2017   Peripheral neuropathy 03/08/2016   Borderline diabetes    Rheumatoid arthritis (HCC) 10/29/2015   DJD (degenerative joint disease) 10/29/2015   Osteoporosis 10/29/2015   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   SUBJECTIVE:  SUBJECTIVE STATEMENT: Pt indicated feeling tired after getting up earlier today do sheets but she didn't finish.  PT indicated having some pain in both knees today.  Pt indicated just feeling tired.  Pt indicated feeling like she can take bigger steps at times and move a little bit faster.   PERTINENT HISTORY:  Medical history: arthritis, asthma, HTN, hyperlipidemia, memory loss.  Recently seen by [facility] for referral "unsteady gait". Today is a continuation of that treatment.   PAIN:  NPRS scale: 8/10 Pain location: knees Pain description: sharp, achy, sore Aggravating factors: WB activity, standing for back Relieving factors: resting, sitting.  Takes medicine for pain  PRECAUTIONS: None  WEIGHT BEARING RESTRICTIONS: No  FALLS:  Has patient fallen in last 6 months? Yes. Number of falls 2 One fall was in the  bathroom and then another in a parking lot.  LIVING ENVIRONMENT: Lives with: lives with their daughter and she lives in [place] and stays with daughter here in [place] Lives in: House/apartment Stairs: 2 story home with bedroom on 2nd floor  Has following equipment at home: Single point cane  OCCUPATION: Retired  PLOF: Independent in daily activity.  Reported no specific hobbies.   PATIENT GOALS: Reduce pain, walk better.   OBJECTIVE:   PATIENT SURVEYS:  Patient-Specific Activity Scoring Scheme  "0" represents "unable to perform." "10" represents "able to perform at prior level. 0 1 2 3 4 5 6 7 8 9  10 (Date and Score)   Activity 07/31/2023    1. Standing prolonged  0    2. walking  3    3. stairs 2   4.chair transfers 2   5.    Score 1.75 avg    Total score = sum of the activity scores/number of activities Minimum detectable change (90%CI) for average score = 2 points Minimum detectable change (90%CI) for single activity score = 3 points  SCREENING FOR RED FLAGS: 07/31/2023 Bowel or bladder incontinence: No Cauda equina syndrome: No  COGNITION: 07/31/2023 Overall cognitive status: WFL normal      SENSATION: 07/31/2023 No specific testing today  MUSCLE LENGTH: 07/31/2023 No specific measurements.   POSTURE:  07/31/2023 rounded shoulders, forward head, decreased lumbar lordosis, increased thoracic kyphosis, and flexed trunk   PALPATION: 07/31/2023 No specific testing today  LOWER EXTREMITY ROM:      Right 07/31/2023 Left 07/31/2023  Hip flexion    Hip extension    Hip abduction    Hip adduction    Hip internal rotation    Hip external rotation    Knee flexion    Knee extension    Ankle dorsiflexion    Ankle plantarflexion    Ankle inversion    Ankle eversion     (Blank rows = not tested)  LOWER EXTREMITY MMT:    MMT Right 06/29/2023 Left 06/29/2023 Right 07/31/2023 Left 07/31/2023  Hip flexion 4/5 4/5 5/5 4/5  Hip extension      Hip  abduction      Hip adduction      Hip internal rotation      Hip external rotation      Knee flexion 4/5 3+/5 5/5 5/5  Knee extension 4/5 3+/5 4/5 4/5  Ankle dorsiflexion 4/5 3+/5 4/5 4/5  Ankle plantarflexion      Ankle inversion      Ankle eversion       (Blank rows = not tested)  SPECIAL TESTS:  07/31/2023 No specific testing today  FUNCTIONAL TESTS:  08/29/2023:  TUG with SPC in Rt UE:   31.39 seconds   08/11/2023: 18 inch chair with foam transfers without UE   07/31/2023 TUG with SPC in Rt UE;  34 seconds    07/31/23 0001  Berg Balance Test  Sit to Stand 2  Standing Unsupported 4  Sitting with Back Unsupported but Feet Supported on Floor or Stool 4  Stand to Sit 2  Transfers 3  Standing Unsupported with Eyes Closed 4  Standing Unsupported with Feet Together 3  From Standing, Reach Forward with Outstretched Arm 1  From Standing Position, Pick up Object from Floor 1  From Standing Position, Turn to Look Behind Over each Shoulder 2  Turn 360 Degrees 1  Standing Unsupported, Alternately Place Feet on Step/Stool 0  Standing Unsupported, One Foot in Front 2  Standing on One Leg 1  Total Score 30    07/03/2023: BERG testing: 38/56  06/29/2023 eval: 5 times sit to stand: 45.62 sec with BUE support 10 meter walk test: 32.85 sec (1 ft/sec)   GAIT: 07/31/2023 Household distances in clinic c SPC in Rt hand.  Reduced step length noted bilaterally with forward head/trunk flexion, reduced gait speed and wider base of support.                                                                                                                                                                                                                    TODAY'S TREATMENT      DATE:  08/29/2023 Therex: Nustep Lvl 5 UE/LE 10 mins for aerobic exercise, endurance, ROM Seated yellow ball hip adductor 5 sec hold x 10 without back rest Seated green band hip abduction clam shell x 20 bilateral without  back rest  Seated LAQ 2 lb weight x 15 bilateral   Neuro Re-ed Feet together stance with green band pulls 2 x 10  Feet together stance with head turns Lt and Rt x 10 each Feet together stance with head up/down x 10 each      TODAY'S TREATMENT      DATE: 08/25/23 TherEx Seated LAQ 2# bil; 2x10 Seated marching with 2# bil; 2x10 Seated hip abduction L4 band 2x10 Seated hamstring curls L4 band 2x10 Seated hip adduction (ball squeeze) 2x10 Isometric opp arm/leg hip flexion 2x10 bil NuStep L4 x 8 min   08/23/23 TherEx NuStep L3 x 8 min - decr resistance due to pain Seated LAQ 2# bil; 2x10 Seated marching with 2# bil; 2x10 Seated hip abduction L4 band  2x10 Seated hip adduction (ball squeeze) 2x10    08/16/2023  NuStep Level 5 Resistance with UE at 8 for 6 minutes Seated straight leg raises 3 sets of 5 for 3 seconds  Functional Activities: Double Leg Press 25# 12 reps with slow eccentrics Single Leg Press 12# Right and 6# Left full range 5 x each side with slow eccentrics   PATIENT EDUCATION:  Education details: HEP, POC updates Person educated: Patient Education method: Explanation, Demonstration, Verbal cues, and Handouts Education comprehension: verbalized understanding, returned demonstration, and verbal cues required  HOME EXERCISE PROGRAM: Access Code: [email or web address] URL: [email or web address] Date: 08/16/2023 Prepared by: Terral Ferrari  Program Notes stop if pain occurs  Exercises - Seated Long Arc Quad  - 1-2 x daily - 7 x weekly - 1-2 sets - 10 reps - 2 hold - Seated Hamstring Curls with Resistance  - 1 x daily - 7 x weekly - 1-2 sets - 10 reps - Seated March  - 1-2 x daily - 7 x weekly - 1-2 sets - 10 reps - Sit to Stand with Counter Support  - 1 x daily - 5 x weekly - 2 sets - 5 reps - Seated Hip Abduction with Resistance  - 1 x daily - 7 x weekly - 1-2 sets - 10 reps - Small Range Straight Leg Raise  - 2 x daily - 7 x weekly - 3-5 sets - 5 reps - 3  seconds hold  ASSESSMENT:  CLINICAL IMPRESSION: TUG was improved by several seconds today in reassessment.  Continued evidences of fatigue, strength deficits and bilateral knee pain as chief impairments and limitations for progression in mobility tolerance/ability. Continued skilled PT services may continue to help address presentation and progress towards goals.   OBJECTIVE IMPAIRMENTS: Abnormal gait, decreased activity tolerance, decreased balance, decreased coordination, decreased endurance, decreased mobility, difficulty walking, decreased ROM, decreased strength, increased fascial restrictions, impaired perceived functional ability, impaired flexibility, impaired UE functional use, improper body mechanics, postural dysfunction, and pain.   ACTIVITY LIMITATIONS: carrying, lifting, bending, sitting, standing, squatting, stairs, transfers, bed mobility, reach over head, hygiene/grooming, and locomotion level  PARTICIPATION LIMITATIONS: meal prep, cleaning, laundry, interpersonal relationship, driving, shopping, and community activity  PERSONAL FACTORS: Medical history: arthritis, asthma, HTN, hyperlipidemia, memory loss, time since onset, multiple body parts involved are also affecting patient's functional outcome.   REHAB POTENTIAL: Fair to good  CLINICAL DECISION MAKING: Evolving/moderate complexity  EVALUATION COMPLEXITY: Moderate   GOALS: Goals reviewed with patient? Yes  SHORT TERM GOALS: (target date for Short term goals are 3 weeks 08/21/2023)  1. Patient will demonstrate independent use of home exercise program to maintain progress from in clinic treatments.  Goal status: on going 08/16/2023  LONG TERM GOALS: (target dates for all long term goals are 10 weeks  10/09/2023 )   1. Patient will demonstrate/report pain at worst less than or equal to 2/10 to facilitate minimal limitation in daily activity secondary to pain symptoms.  Goal status: Ongoing 08/16/2023   2. Patient  will demonstrate independent use of home exercise program to facilitate ability to maintain/progress functional gains from skilled physical therapy services.  Goal status: New   3. Patient will demonstrate Patient specific functional scale avg > or = 7/10 to indicate reduced disability due to condition.   Goal status: New   4. Patient will demonstrate Randye Buttner testing > 45 to indicate reduced fall risk for daily mobility.   Goal status: New   5.  Patient will demonstrate TUG < 15  seconds to indicate reduced fall risk for daily mobility.   Goal status: New   6.  Patient will demonstrate bilateral LE MMT 5/5 to facilitate transfers, ambulation.  Goal status: Ongoing 08/16/2023   7.  Patient will demonstrate reciprocal gait pattern up/down stairs with hand rail and cane for household activity.  Goal Status: Ongoing 08/16/2023  PLAN:  PT FREQUENCY: 2x/week  PT DURATION: 10 weeks  PLANNED INTERVENTIONS: Can include 16109- PT Re-evaluation, 97110-Therapeutic exercises, 97530- Therapeutic activity, 97112- Neuromuscular re-education, 97535- Self Care, 97140- Manual therapy, [phone]- Gait training,[id number]- Electrical stimulation (unattended), 97750 Physical performance testing,    Patient/Family education, Balance training, Stair training, Taping, Dry Needling, Joint mobilization, Joint manipulation, Spinal manipulation, Spinal mobilization, Scar mobilization, Vestibular training, Visual/preceptual remediation/compensation, DME instructions, Cryotherapy, and Moist heat.  All performed as medically necessary.  All included unless contraindicated  PLAN FOR NEXT SESSION: Build strength and endurance as able with balance intervention mixed in.  Has 3 more [employer] approved visits.   Bonna Bustard, PT, DPT, OCS, ATC 08/29/23  2:27 PM      Referring diagnosis? M25.561,M25.562 (ICD-10-CM) - Acute pain of both knees Treatment diagnosis? (if different than referring diagnosis) M25.562, M25.561, M54.59,  M62.81 What was this (referring dx) caused by? []  Surgery []  Fall [x]  Ongoing issue [x]  Arthritis []  Other: ____________  Laterality: []  Rt []  Lt [x]  Both  Check all possible CPT codes:  *CHOOSE 10 OR LESS*    See Planned Interventions listed in the Plan section of the Evaluation.

## 2023-08-31 ENCOUNTER — Ambulatory Visit: Admitting: Rehabilitative and Restorative Service Providers"

## 2023-08-31 ENCOUNTER — Encounter: Payer: Self-pay | Admitting: Rehabilitative and Restorative Service Providers"

## 2023-08-31 DIAGNOSIS — M6281 Muscle weakness (generalized): Secondary | ICD-10-CM

## 2023-08-31 DIAGNOSIS — M5459 Other low back pain: Secondary | ICD-10-CM | POA: Diagnosis not present

## 2023-08-31 DIAGNOSIS — M25561 Pain in right knee: Secondary | ICD-10-CM

## 2023-08-31 DIAGNOSIS — M25562 Pain in left knee: Secondary | ICD-10-CM

## 2023-08-31 DIAGNOSIS — G8929 Other chronic pain: Secondary | ICD-10-CM

## 2023-08-31 DIAGNOSIS — R262 Difficulty in walking, not elsewhere classified: Secondary | ICD-10-CM

## 2023-08-31 NOTE — Therapy (Signed)
 OUTPATIENT PHYSICAL THERAPY TREATMENT   Patient Name: Teresa French MRN: [id number] DOB:[birth year], 83 y.o., female Today's Date: 08/31/2023   END OF SESSION:  PT End of Session - 08/31/23 0902     Visit Number 17    Number of Visits 29    Date for PT Re-Evaluation 10/09/23    Authorization Type [employer]-auth submitted    Authorization Time Period 07/31/2023- 10/09/2023    Authorization - Visit Number 8    Authorization - Number of Visits 10    Progress Note Due on Visit 20    PT Start Time 0854    PT Stop Time 0933    PT Time Calculation (min) 39 min    Activity Tolerance Patient limited by pain;Patient limited by fatigue    Behavior During Therapy [facility] for tasks assessed/performed                 Past Medical History:  Diagnosis Date   Arthritis    Asthma    Borderline diabetes    Breast mass, left 07/10/2017   06/19/17: mammos: lobulated mass sup & lat to nipple; US : 1.4x0.5x1.0 cm  Bi-RADS4   Complication of anesthesia    I SLEEP A VERY VERY LONG TIME   Eczema    Environmental allergies    Glaucoma    History of transfusion    HTN (hypertension), benign 07/10/2017   Hyperlipidemia    Hypertension    Memory loss    Tingling    OF FEET   Past Surgical History:  Procedure Laterality Date   BREAST BIOPSY Bilateral    3-5 EACH BREAST   BREAST CYST ASPIRATION     CERVICAL LAMINECTOMY     DILATION AND CURETTAGE OF UTERUS     GANGLION CYST EXCISION     X3   JOINT REPLACEMENT  2007   left hip   TOTAL HIP ARTHROPLASTY Right 11/21/2014   Procedure: RIGHT TOTAL HIP ARTHROPLASTY ANTERIOR APPROACH;  Surgeon: Arnie Lao, MD;  Location: [facility];  Service: Orthopedics;  Laterality: Right;   Patient Active Problem List   Diagnosis Date Noted   Bronchiectasis (HCC) 10/28/2020   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   COVID-19 09/16/2020   Asthmatic bronchitis 09/08/2020   OSA (obstructive sleep apnea) 09/08/2020   DVT (deep venous  thrombosis) (HCC) 02/12/2019   Breast mass, left 07/10/2017   HTN (hypertension), benign 07/10/2017   Peripheral neuropathy 03/08/2016   Borderline diabetes    Rheumatoid arthritis (HCC) 10/29/2015   DJD (degenerative joint disease) 10/29/2015   Osteoporosis 10/29/2015   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   SUBJECTIVE:  SUBJECTIVE STATEMENT: Pt indicated having tired and knee pain for a day or so after visit.  Reported not doing much in that time.  Pt. Indicated pain with walking today but not as bad.    PERTINENT HISTORY:  Medical history: arthritis, asthma, HTN, hyperlipidemia, memory loss.  Recently seen by [facility] for referral unsteady gait. Today is a continuation of that treatment.   PAIN:  NPRS scale: 6/10 Pain location: knees Pain description: sharp, achy, sore Aggravating factors: WB activity, standing for back Relieving factors: resting, sitting.  Takes medicine for pain  PRECAUTIONS: None  WEIGHT BEARING RESTRICTIONS: No  FALLS:  Has patient fallen in last 6 months? Yes. Number of falls 2 One fall was in the bathroom and then another in a parking lot.  LIVING ENVIRONMENT: Lives with: lives with their daughter  and she lives in [place] and stays with daughter here in [place] Lives in: House/apartment Stairs: 2 story home with bedroom on 2nd floor  Has following equipment at home: Single point cane  OCCUPATION: Retired  PLOF: Independent in daily activity.  Reported no specific hobbies.   PATIENT GOALS: Reduce pain, walk better.   OBJECTIVE:   PATIENT SURVEYS:  Patient-Specific Activity Scoring Scheme  0 represents "unable to perform." 10 represents "able to perform at prior level. 0 1 2 3 4 5 6 7 8 9  10 (Date and Score)   Activity 07/31/2023    1. Standing prolonged  0    2. walking  3    3. stairs 2   4.chair transfers 2   5.    Score 1.75 avg    Total score = sum of the activity scores/number of activities Minimum detectable change (90%CI) for average score = 2 points Minimum detectable change (90%CI) for single activity score = 3 points  SCREENING FOR RED FLAGS: 07/31/2023 Bowel or bladder incontinence: No Cauda equina syndrome: No  COGNITION: 07/31/2023 Overall cognitive status: WFL normal      SENSATION: 07/31/2023 No specific testing today  MUSCLE LENGTH: 07/31/2023 No specific measurements.   POSTURE:  07/31/2023 rounded shoulders, forward head, decreased lumbar lordosis, increased thoracic kyphosis, and flexed trunk   PALPATION: 07/31/2023 No specific testing today  LOWER EXTREMITY ROM:      Right 07/31/2023 Left 07/31/2023  Hip flexion    Hip extension    Hip abduction    Hip adduction    Hip internal rotation    Hip external rotation    Knee flexion    Knee extension    Ankle dorsiflexion    Ankle plantarflexion    Ankle inversion    Ankle eversion     (Blank rows = not tested)  LOWER EXTREMITY MMT:    MMT Right 06/29/2023 Left 06/29/2023 Right 07/31/2023 Left 07/31/2023 Right 08/31/2023 Left 08/31/2023  Hip flexion 4/5 4/5 5/5 4/5    Hip extension        Hip abduction        Hip adduction        Hip internal rotation         Hip external rotation        Knee flexion 4/5 3+/5 5/5 5/5    Knee extension 4/5 3+/5 4/5 4/5 4/5 4/5  Ankle dorsiflexion 4/5 3+/5 4/5 4/5    Ankle plantarflexion        Ankle inversion        Ankle eversion         (Blank rows = not tested)  SPECIAL TESTS:  07/31/2023 No specific testing today  FUNCTIONAL TESTS:  08/29/2023: TUG with SPC in Rt UE:   31.39 seconds   08/11/2023: 18 inch chair with foam transfers without UE   07/31/2023 TUG with SPC in Rt UE;  34 seconds    07/31/23 0001  Berg Balance Test  Sit to Stand 2  Standing Unsupported 4  Sitting with Back Unsupported but Feet Supported on Floor or Stool 4  Stand to Sit 2  Transfers 3  Standing Unsupported with Eyes Closed 4  Standing Unsupported with Feet Together 3  From Standing, Reach Forward with Outstretched Arm 1  From Standing Position, Pick up Object from Floor 1  From Standing Position, Turn to Look Behind Over each Shoulder 2  Turn 360 Degrees 1  Standing Unsupported, Alternately Place Feet on Step/Stool 0  Standing Unsupported, One Foot in Front 2  Standing on One Leg 1  Total Score 30    07/03/2023: BERG testing: 38/56  06/29/2023 eval: 5 times sit to stand: 45.62 sec with BUE support 10 meter walk test: 32.85 sec (1 ft/sec)   GAIT: 07/31/2023 Household distances in clinic c SPC in Rt hand.  Reduced step length noted bilaterally with forward head/trunk flexion, reduced gait speed and wider base of support.                                                                                                                                                                                                                    TODAY'S TREATMENT      DATE:  08/31/2023 Therex: Nustep Lvl 5 UE/LE 10 mins for aerobic exercise, endurance, ROM Seated leg extension isometric holds in 45 deg knee flexion 5 sec hold x 12 bilateral painfree focus. Time spent in education and recovery time.  Additional time spent in  review of exercise routine in sitting.   Neuro Re-ed Seated activity performed without back support, sitting on foam:  Seated marching 2 x 10 bilateral   Seated green band hip clam shell c isometric hold opposite leg 3 x 15 bilateral   Pain neuroscience education regarding chronic pain response/sensitivity increases.  Also discussed muscle adaptation and neuro muscular recruitment improvements and its benefits for symptoms and ability to perform movements. Discussed balance adaptation strategies (ankle, hip, stepping).    TODAY'S TREATMENT      DATE:  08/29/2023 Therex: Nustep Lvl 5 UE/LE 10 mins for aerobic exercise, endurance, ROM Seated yellow ball hip adductor 5 sec hold x 10  without back rest Seated green band hip abduction clam shell x 20 bilateral without back rest  Seated LAQ 2 lb weight x 15 bilateral   Neuro Re-ed Feet together stance with green band pulls 2 x 10  Feet together stance with head turns Lt and Rt x 10 each Feet together stance with head up/down x 10 each    TODAY'S TREATMENT      DATE: 08/25/23 TherEx Seated LAQ 2# bil; 2x10 Seated marching with 2# bil; 2x10 Seated hip abduction L4 band 2x10 Seated hamstring curls L4 band 2x10 Seated hip adduction (ball squeeze) 2x10 Isometric opp arm/leg hip flexion 2x10 bil NuStep L4 x 8 min   08/23/23 TherEx NuStep L3 x 8 min - decr resistance due to pain Seated LAQ 2# bil; 2x10 Seated marching with 2# bil; 2x10 Seated hip abduction L4 band 2x10 Seated hip adduction (ball squeeze) 2x10   PATIENT EDUCATION:  Education details: HEP, POC updates Person educated: Patient Education method: Explanation, Demonstration, Verbal cues, and Handouts Education comprehension: verbalized understanding, returned demonstration, and verbal cues required  HOME EXERCISE PROGRAM: Access Code: [email or web address] URL: [email or web address] Date: 08/16/2023 Prepared by: Terral Ferrari  Program Notes stop if pain  occurs  Exercises - Seated Long Arc Quad  - 1-2 x daily - 7 x weekly - 1-2 sets - 10 reps - 2 hold - Seated Hamstring Curls with Resistance  - 1 x daily - 7 x weekly - 1-2 sets - 10 reps - Seated March  - 1-2 x daily - 7 x weekly - 1-2 sets - 10 reps - Sit to Stand with Counter Support  - 1 x daily - 5 x weekly - 2 sets - 5 reps - Seated Hip Abduction with Resistance  - 1 x daily - 7 x weekly - 1-2 sets - 10 reps - Small Range Straight Leg Raise  - 2 x daily - 7 x weekly - 3-5 sets - 5 reps - 3 seconds hold  ASSESSMENT:  CLINICAL IMPRESSION: Pt has continued trouble c increased activity due to fatigue and knee pain.  Continued assessment and adjustment of intervention to promote symptom reduction/management as able but to still address balance challenges and overall strength/endurance impairments.  Pt may continue to benefit from skilled PT services to progress.   OBJECTIVE IMPAIRMENTS: Abnormal gait, decreased activity tolerance, decreased balance, decreased coordination, decreased endurance, decreased mobility, difficulty walking, decreased ROM, decreased strength, increased fascial restrictions, impaired perceived functional ability, impaired flexibility, impaired UE functional use, improper body mechanics, postural dysfunction, and pain.   ACTIVITY LIMITATIONS: carrying, lifting, bending, sitting, standing, squatting, stairs, transfers, bed mobility, reach over head, hygiene/grooming, and locomotion level  PARTICIPATION LIMITATIONS: meal prep, cleaning, laundry, interpersonal relationship, driving, shopping, and community activity  PERSONAL FACTORS: Medical history: arthritis, asthma, HTN, hyperlipidemia, memory loss, time since onset, multiple body parts involved are also affecting patient's functional outcome.   REHAB POTENTIAL: Fair to good  CLINICAL DECISION MAKING: Evolving/moderate complexity  EVALUATION COMPLEXITY: Moderate   GOALS: Goals reviewed with patient? Yes  SHORT  TERM GOALS: (target date for Short term goals are 3 weeks 08/21/2023)  1. Patient will demonstrate independent use of home exercise program to maintain progress from in clinic treatments.  Goal status: on going 08/16/2023  LONG TERM GOALS: (target dates for all long term goals are 10 weeks  10/09/2023 )   1. Patient will demonstrate/report pain at worst less than or equal to 2/10 to facilitate minimal limitation in daily activity secondary to pain  symptoms.  Goal status: Ongoing 08/16/2023   2. Patient will demonstrate independent use of home exercise program to facilitate ability to maintain/progress functional gains from skilled physical therapy services.  Goal status: on going 08/31/2023   3. Patient will demonstrate Patient specific functional scale avg > or = 7/10 to indicate reduced disability due to condition.   Goal status: on going 08/31/2023   4. Patient will demonstrate Randye Buttner testing > 45 to indicate reduced fall risk for daily mobility.   Goal status: on going 08/31/2023   5.  Patient will demonstrate TUG < 15 seconds to indicate reduced fall risk for daily mobility.   Goal status: on going 08/31/2023   6.  Patient will demonstrate bilateral LE MMT 5/5 to facilitate transfers, ambulation.  Goal status: on going 08/31/2023   7.  Patient will demonstrate reciprocal gait pattern up/down stairs with hand rail and cane for household activity.  Goal Status: on going 08/31/2023  PLAN:  PT FREQUENCY: 2x/week  PT DURATION: 10 weeks  PLANNED INTERVENTIONS: Can include 40981- PT Re-evaluation, 97110-Therapeutic exercises, 97530- Therapeutic activity, 97112- Neuromuscular re-education, 97535- Self Care, [id number]- Manual therapy, [phone]- Gait training,[id number]- Electrical stimulation (unattended), 97750 Physical performance testing,    Patient/Family education, Balance training, Stair training, Taping, Dry Needling, Joint mobilization, Joint manipulation, Spinal manipulation, Spinal  mobilization, Scar mobilization, Vestibular training, Visual/preceptual remediation/compensation, DME instructions, Cryotherapy, and Moist heat.  All performed as medically necessary.  All included unless contraindicated  PLAN FOR NEXT SESSION: Continue balance , strengthening as able based off knee pain tolerance.   Bonna Bustard, PT, DPT, OCS, ATC 08/31/23  9:37 AM      Referring diagnosis? M25.561,M25.562 (ICD-10-CM) - Acute pain of both knees Treatment diagnosis? (if different than referring diagnosis) M25.562, M25.561, M54.59, M62.81 What was this (referring dx) caused by? []  Surgery []  Fall [x]  Ongoing issue [x]  Arthritis []  Other: ____________  Laterality: []  Rt []  Lt [x]  Both  Check all possible CPT codes:  *CHOOSE 10 OR LESS*    See Planned Interventions listed in the Plan section of the Evaluation.

## 2023-09-04 ENCOUNTER — Ambulatory Visit: Admitting: Rehabilitative and Restorative Service Providers"

## 2023-09-04 ENCOUNTER — Encounter: Payer: Self-pay | Admitting: Rehabilitative and Restorative Service Providers"

## 2023-09-04 DIAGNOSIS — M6281 Muscle weakness (generalized): Secondary | ICD-10-CM | POA: Diagnosis not present

## 2023-09-04 DIAGNOSIS — M25561 Pain in right knee: Secondary | ICD-10-CM

## 2023-09-04 DIAGNOSIS — M5459 Other low back pain: Secondary | ICD-10-CM | POA: Diagnosis not present

## 2023-09-04 DIAGNOSIS — G8929 Other chronic pain: Secondary | ICD-10-CM

## 2023-09-04 DIAGNOSIS — M25562 Pain in left knee: Secondary | ICD-10-CM

## 2023-09-04 DIAGNOSIS — R262 Difficulty in walking, not elsewhere classified: Secondary | ICD-10-CM

## 2023-09-04 NOTE — Therapy (Signed)
 OUTPATIENT PHYSICAL THERAPY TREATMENT   Patient Name: Teresa French MRN: [id number] DOB:May 19, 1940, 83 y.o., female Today's Date: 09/04/2023   END OF SESSION:  PT End of Session - 09/04/23 1025     Visit Number 18    Number of Visits 29    Date for PT Re-Evaluation 10/09/23    Authorization Type [employer]-auth submitted    Authorization Time Period 07/31/2023- 10/09/2023    Authorization - Visit Number 9    Authorization - Number of Visits 10    Progress Note Due on Visit 20    PT Start Time 1019    PT Stop Time 1058    PT Time Calculation (min) 39 min    Activity Tolerance Patient limited by pain;Patient limited by fatigue    Behavior During Therapy [facility] for tasks assessed/performed                  Past Medical History:  Diagnosis Date   Arthritis    Asthma    Borderline diabetes    Breast mass, left 07/10/2017   06/19/17: mammos: lobulated mass sup & lat to nipple; US : 1.4x0.5x1.0 cm  Bi-RADS4   Complication of anesthesia    I SLEEP A VERY VERY LONG TIME   Eczema    Environmental allergies    Glaucoma    History of transfusion    HTN (hypertension), benign 07/10/2017   Hyperlipidemia    Hypertension    Memory loss    Tingling    OF FEET   Past Surgical History:  Procedure Laterality Date   BREAST BIOPSY Bilateral    3-5 EACH BREAST   BREAST CYST ASPIRATION     CERVICAL LAMINECTOMY     DILATION AND CURETTAGE OF UTERUS     GANGLION CYST EXCISION     X3   JOINT REPLACEMENT  2007   left hip   TOTAL HIP ARTHROPLASTY Right 11/21/2014   Procedure: RIGHT TOTAL HIP ARTHROPLASTY ANTERIOR APPROACH;  Surgeon: Arnie Lao, MD;  Location: [facility];  Service: Orthopedics;  Laterality: Right;   Patient Active Problem List   Diagnosis Date Noted   Bronchiectasis (HCC) 10/28/2020   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   COVID-19 09/16/2020   Asthmatic bronchitis 09/08/2020   OSA (obstructive sleep apnea) 09/08/2020   DVT (deep venous  thrombosis) (HCC) 02/12/2019   Breast mass, left 07/10/2017   HTN (hypertension), benign 07/10/2017   Peripheral neuropathy 03/08/2016   Borderline diabetes    Rheumatoid arthritis (HCC) 10/29/2015   DJD (degenerative joint disease) 10/29/2015   Osteoporosis 10/29/2015   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   SUBJECTIVE:  SUBJECTIVE STATEMENT: Pt indicated not feeling as bad today.  Took pain medicine early this morning.    PERTINENT HISTORY:  Medical history: arthritis, asthma, HTN, hyperlipidemia, memory loss.  Recently seen by [facility] for referral unsteady gait. Today is a continuation of that treatment.   PAIN:  NPRS scale: 7/10 Pain location: knees Pain description: sharp, achy, sore Aggravating factors: WB activity, standing for back Relieving factors: resting, sitting.  Takes medicine for pain  PRECAUTIONS: None  WEIGHT BEARING RESTRICTIONS: No  FALLS:  Has patient fallen in last 6 months? Yes. Number of falls 2 One fall was in the bathroom and then another in a parking lot.  LIVING ENVIRONMENT: Lives with: lives with their daughter and she lives in [place] and stays with daughter here in [place] Lives  in: House/apartment Stairs: 2 story home with bedroom on 2nd floor  Has following equipment at home: Single point cane  OCCUPATION: Retired  PLOF: Independent in daily activity.  Reported no specific hobbies.   PATIENT GOALS: Reduce pain, walk better.   OBJECTIVE:   PATIENT SURVEYS:  Patient-Specific Activity Scoring Scheme  0 represents "unable to perform." 10 represents "able to perform at prior level. 0 1 2 3 4 5 6 7 8 9  10 (Date and Score)   Activity 07/31/2023    1. Standing prolonged  0    2. walking  3    3. stairs 2   4.chair transfers 2   5.    Score 1.75 avg    Total score = sum of the activity scores/number of activities Minimum detectable change (90%CI) for average score = 2 points Minimum detectable change (90%CI) for single activity score = 3 points  SCREENING FOR RED FLAGS: 07/31/2023 Bowel or bladder incontinence: No Cauda equina syndrome: No  COGNITION: 07/31/2023 Overall cognitive status: WFL normal      SENSATION: 07/31/2023 No specific testing today  MUSCLE LENGTH: 07/31/2023 No specific measurements.   POSTURE:  07/31/2023 rounded shoulders, forward head, decreased lumbar lordosis, increased thoracic kyphosis, and flexed trunk   PALPATION: 07/31/2023 No specific testing today  LOWER EXTREMITY ROM:      Right 07/31/2023 Left 07/31/2023  Hip flexion    Hip extension    Hip abduction    Hip adduction    Hip internal rotation    Hip external rotation    Knee flexion    Knee extension    Ankle dorsiflexion    Ankle plantarflexion    Ankle inversion    Ankle eversion     (Blank rows = not tested)  LOWER EXTREMITY MMT:    MMT Right 06/29/2023 Left 06/29/2023 Right 07/31/2023 Left 07/31/2023 Right 08/31/2023 Left 08/31/2023  Hip flexion 4/5 4/5 5/5 4/5    Hip extension        Hip abduction        Hip adduction        Hip internal rotation        Hip external rotation        Knee flexion 4/5 3+/5 5/5 5/5    Knee extension 4/5  3+/5 4/5 4/5 4/5 4/5  Ankle dorsiflexion 4/5 3+/5 4/5 4/5    Ankle plantarflexion        Ankle inversion        Ankle eversion         (Blank rows = not tested)  SPECIAL TESTS:  07/31/2023 No specific testing today  FUNCTIONAL TESTS:  08/29/2023: TUG with SPC in Rt UE:  31.39 seconds   08/11/2023: 18 inch chair with foam transfers without UE   07/31/2023 TUG with SPC in Rt UE;  34 seconds    07/31/23 0001  Berg Balance Test  Sit to Stand 2  Standing Unsupported 4  Sitting with Back Unsupported but Feet Supported on Floor or Stool 4  Stand to Sit 2  Transfers 3  Standing Unsupported with Eyes Closed 4  Standing Unsupported with Feet Together 3  From Standing, Reach Forward with Outstretched Arm 1  From Standing Position, Pick up Object from Floor 1  From Standing Position, Turn to Look Behind Over each Shoulder 2  Turn 360 Degrees 1  Standing Unsupported, Alternately Place Feet on Step/Stool 0  Standing Unsupported, One Foot in Front 2  Standing on One Leg 1  Total Score 30    07/03/2023: BERG testing: 38/56  06/29/2023 eval: 5 times sit to stand: 45.62 sec with BUE support 10 meter walk test: 32.85 sec (1 ft/sec)   GAIT: 07/31/2023 Household distances in clinic c SPC in Rt hand.  Reduced step length noted bilaterally with forward head/trunk flexion, reduced gait speed and wider base of support.                                                                                                                                                                                                                    TODAY'S TREATMENT      DATE:  09/04/2023 Therex: Nustep Lvl 5 UE/LE 10 mins for aerobic exercise, endurance, ROM  Review of sitting based exercise HEP.   Neuro Re-ed Tandem stance 1 min x 1 bilateral with occasional HHA Feet together stance eyes open 1 min on floor, 1 min on foam with SBA Seated blue band marching alternating 2 x 10 bilateral without backrest on  foam pad Seated blue band hip abduction with contralateral isometric hold 2 x 15 bilateral without backrest on foam pad SLS focus with light Rt hand assist on bar 3 cones tapping with contralateral leg x 6 each, performed bilaterally with SBA  TODAY'S TREATMENT      DATE:  08/31/2023 Therex: Nustep Lvl 5 UE/LE 10 mins for aerobic exercise, endurance, ROM Seated leg extension isometric holds in 45 deg knee flexion 5 sec hold x 12 bilateral painfree focus. Time spent in education and recovery time.  Additional time spent in review of exercise routine in sitting.   Neuro Re-ed Seated activity performed without back support, sitting on foam:  Seated marching 2  x 10 bilateral   Seated green band hip clam shell c isometric hold opposite leg 3 x 15 bilateral   Pain neuroscience education regarding chronic pain response/sensitivity increases.  Also discussed muscle adaptation and neuro muscular recruitment improvements and its benefits for symptoms and ability to perform movements. Discussed balance adaptation strategies (ankle, hip, stepping).    TODAY'S TREATMENT      DATE:  08/29/2023 Therex: Nustep Lvl 5 UE/LE 10 mins for aerobic exercise, endurance, ROM Seated yellow ball hip adductor 5 sec hold x 10 without back rest Seated green band hip abduction clam shell x 20 bilateral without back rest  Seated LAQ 2 lb weight x 15 bilateral   Neuro Re-ed Feet together stance with green band pulls 2 x 10  Feet together stance with head turns Lt and Rt x 10 each Feet together stance with head up/down x 10 each    TODAY'S TREATMENT      DATE: 08/25/23 TherEx Seated LAQ 2# bil; 2x10 Seated marching with 2# bil; 2x10 Seated hip abduction L4 band 2x10 Seated hamstring curls L4 band 2x10 Seated hip adduction (ball squeeze) 2x10 Isometric opp arm/leg hip flexion 2x10 bil NuStep L4 x 8 min   08/23/23 TherEx NuStep L3 x 8 min - decr resistance due to pain Seated LAQ 2# bil; 2x10 Seated marching with  2# bil; 2x10 Seated hip abduction L4 band 2x10 Seated hip adduction (ball squeeze) 2x10   PATIENT EDUCATION:  Education details: HEP, POC updates Person educated: Patient Education method: Explanation, Demonstration, Verbal cues, and Handouts Education comprehension: verbalized understanding, returned demonstration, and verbal cues required  HOME EXERCISE PROGRAM: Access Code: [email or web address] URL: [email or web address] Date: 08/16/2023 Prepared by: Terral Ferrari  Program Notes stop if pain occurs  Exercises - Seated Long Arc Quad  - 1-2 x daily - 7 x weekly - 1-2 sets - 10 reps - 2 hold - Seated Hamstring Curls with Resistance  - 1 x daily - 7 x weekly - 1-2 sets - 10 reps - Seated March  - 1-2 x daily - 7 x weekly - 1-2 sets - 10 reps - Sit to Stand with Counter Support  - 1 x daily - 5 x weekly - 2 sets - 5 reps - Seated Hip Abduction with Resistance  - 1 x daily - 7 x weekly - 1-2 sets - 10 reps - Small Range Straight Leg Raise  - 2 x daily - 7 x weekly - 3-5 sets - 5 reps - 3 seconds hold  ASSESSMENT:  CLINICAL IMPRESSION: Able to return to some standing based exercise for balance/strength improvements to help improve stability ambulation.  Rest breaks required due to fatigue and some knee complaints at times in WB.  Knee pain has limited progressive WB strengthening interventions to this point.  Continued functional strengthening and balance improvements to help improve activity tolerance.   OBJECTIVE IMPAIRMENTS: Abnormal gait, decreased activity tolerance, decreased balance, decreased coordination, decreased endurance, decreased mobility, difficulty walking, decreased ROM, decreased strength, increased fascial restrictions, impaired perceived functional ability, impaired flexibility, impaired UE functional use, improper body mechanics, postural dysfunction, and pain.   ACTIVITY LIMITATIONS: carrying, lifting, bending, sitting, standing, squatting, stairs, transfers,  bed mobility, reach over head, hygiene/grooming, and locomotion level  PARTICIPATION LIMITATIONS: meal prep, cleaning, laundry, interpersonal relationship, driving, shopping, and community activity  PERSONAL FACTORS: Medical history: arthritis, asthma, HTN, hyperlipidemia, memory loss, time since onset, multiple body parts involved are also affecting patient's functional outcome.   REHAB POTENTIAL: Fair to  good  CLINICAL DECISION MAKING: Evolving/moderate complexity  EVALUATION COMPLEXITY: Moderate   GOALS: Goals reviewed with patient? Yes  SHORT TERM GOALS: (target date for Short term goals are 3 weeks 08/21/2023)  1. Patient will demonstrate independent use of home exercise program to maintain progress from in clinic treatments.  Goal status: on going 08/16/2023  LONG TERM GOALS: (target dates for all long term goals are 10 weeks  10/09/2023 )   1. Patient will demonstrate/report pain at worst less than or equal to 2/10 to facilitate minimal limitation in daily activity secondary to pain symptoms.  Goal status: Ongoing 08/16/2023   2. Patient will demonstrate independent use of home exercise program to facilitate ability to maintain/progress functional gains from skilled physical therapy services.  Goal status: on going 08/31/2023   3. Patient will demonstrate Patient specific functional scale avg > or = 7/10 to indicate reduced disability due to condition.   Goal status: on going 08/31/2023   4. Patient will demonstrate Randye Buttner testing > 45 to indicate reduced fall risk for daily mobility.   Goal status: on going 08/31/2023   5.  Patient will demonstrate TUG < 15 seconds to indicate reduced fall risk for daily mobility.   Goal status: on going 08/31/2023   6.  Patient will demonstrate bilateral LE MMT 5/5 to facilitate transfers, ambulation.  Goal status: on going 08/31/2023   7.  Patient will demonstrate reciprocal gait pattern up/down stairs with hand rail and cane for household  activity.  Goal Status: on going 08/31/2023  PLAN:  PT FREQUENCY: 2x/week  PT DURATION: 10 weeks  PLANNED INTERVENTIONS: Can include 21308- PT Re-evaluation, 97110-Therapeutic exercises, 97530- Therapeutic activity, 97112- Neuromuscular re-education, 97535- Self Care, 97140- Manual therapy, [phone]- Gait training,[id number]- Electrical stimulation (unattended), 97750 Physical performance testing,    Patient/Family education, Balance training, Stair training, Taping, Dry Needling, Joint mobilization, Joint manipulation, Spinal manipulation, Spinal mobilization, Scar mobilization, Vestibular training, Visual/preceptual remediation/compensation, DME instructions, Cryotherapy, and Moist heat.  All performed as medically necessary.  All included unless contraindicated  PLAN FOR NEXT SESSION: 10th visit progress note, [employer] cert  Bonna Bustard, PT, DPT, OCS, ATC 09/04/23  10:52 AM      Referring diagnosis? M25.561,M25.562 (ICD-10-CM) - Acute pain of both knees Treatment diagnosis? (if different than referring diagnosis) M25.562, M25.561, M54.59, M62.81 What was this (referring dx) caused by? []  Surgery []  Fall [x]  Ongoing issue [x]  Arthritis []  Other: ____________  Laterality: []  Rt []  Lt [x]  Both  Check all possible CPT codes:  *CHOOSE 10 OR LESS*    See Planned Interventions listed in the Plan section of the Evaluation.

## 2023-09-06 ENCOUNTER — Encounter: Payer: Self-pay | Admitting: Rehabilitative and Restorative Service Providers"

## 2023-09-06 ENCOUNTER — Ambulatory Visit (INDEPENDENT_AMBULATORY_CARE_PROVIDER_SITE_OTHER): Admitting: Rehabilitative and Restorative Service Providers"

## 2023-09-06 DIAGNOSIS — M5459 Other low back pain: Secondary | ICD-10-CM

## 2023-09-06 DIAGNOSIS — M25562 Pain in left knee: Secondary | ICD-10-CM

## 2023-09-06 DIAGNOSIS — M25561 Pain in right knee: Secondary | ICD-10-CM | POA: Diagnosis not present

## 2023-09-06 DIAGNOSIS — R262 Difficulty in walking, not elsewhere classified: Secondary | ICD-10-CM

## 2023-09-06 DIAGNOSIS — M6281 Muscle weakness (generalized): Secondary | ICD-10-CM

## 2023-09-06 DIAGNOSIS — G8929 Other chronic pain: Secondary | ICD-10-CM

## 2023-09-06 NOTE — Therapy (Signed)
 OUTPATIENT PHYSICAL THERAPY TREATMENT / PROGRESS NOTE/ RECERT   Patient Name: Teresa French MRN: [id number] DOB:[birth year], 83 y.o., female Today's Date: 09/06/2023  Progress Note Reporting Period 07/31/2023 to 09/06/2023  See note below for Objective Data and Assessment of Progress/Goals.       END OF SESSION:  PT End of Session - 09/06/23 1029     Visit Number 19    Number of Visits 31    Date for PT Re-Evaluation 10/18/23    Authorization Type [employer] Medicare-auth submitted    Authorization Time Period 07/31/2023- 10/09/2023  - asking for human[employer] extension starting on next visit for 12.    Authorization - Visit Number 10    Authorization - Number of Visits 10    Progress Note Due on Visit 20    PT Start Time 1024    PT Stop Time 1102    PT Time Calculation (min) 38 min    Activity Tolerance Patient limited by pain;Patient limited by fatigue    Behavior During Therapy [facility] for tasks assessed/performed                   Past Medical History:  Diagnosis Date   Arthritis    Asthma    Borderline diabetes    Breast mass, left 07/10/2017   06/19/17: mammos: lobulated mass sup & lat to nipple; US : 1.4x0.5x1.0 cm  Bi-RADS4   Complication of anesthesia    I SLEEP A VERY VERY LONG TIME   Eczema    Environmental allergies    Glaucoma    History of transfusion    HTN (hypertension), benign 07/10/2017   Hyperlipidemia    Hypertension    Memory loss    Tingling    OF FEET   Past Surgical History:  Procedure Laterality Date   BREAST BIOPSY Bilateral    3-5 EACH BREAST   BREAST CYST ASPIRATION     CERVICAL LAMINECTOMY     DILATION AND CURETTAGE OF UTERUS     GANGLION CYST EXCISION     X3   JOINT REPLACEMENT  2007   left hip   TOTAL HIP ARTHROPLASTY Right 11/21/2014   Procedure: RIGHT TOTAL HIP ARTHROPLASTY ANTERIOR APPROACH;  Surgeon: Arnie Lao, MD;  Location: [facility];  Service: Orthopedics;  Laterality: Right;   Patient Active Problem List    Diagnosis Date Noted   Bronchiectasis (HCC) 10/28/2020   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   COVID-19 09/16/2020   Asthmatic bronchitis 09/08/2020   OSA (obstructive sleep apnea) 09/08/2020   DVT (deep venous thrombosis) (HCC) 02/12/2019   Breast mass, left 07/10/2017   HTN (hypertension), benign 07/10/2017   Peripheral neuropathy 03/08/2016   Borderline diabetes    Rheumatoid arthritis (HCC) 10/29/2015   DJD (degenerative joint disease) 10/29/2015   Osteoporosis 10/29/2015   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   SUBJECTIVE:  SUBJECTIVE STATEMENT: Pt indicated indicated last day or two have been a little better on pain.  Reported Sunday was a day of pain troubles which led her to stay in bed more.    Moderately better +4 reported on global rating of change.   PERTINENT HISTORY:  Medical history: arthritis, asthma, HTN, hyperlipidemia, memory loss.  Recently seen by [facility] for referral unsteady gait. Today is a continuation of that treatment.   PAIN:  NPRS scale: 7/10 Pain location: knees Pain description: sharp, achy, sore Aggravating factors: WB activity, standing for back Relieving factors: resting, sitting.   Takes medicine for pain  PRECAUTIONS: None  WEIGHT BEARING RESTRICTIONS: No  FALLS:  Has patient fallen in last 6 months? Yes. Number of falls 2 One fall was in the bathroom and then another in a parking lot.  LIVING ENVIRONMENT: Lives with: lives with their daughter and she lives in [place] and stays with daughter here in [place] Lives in: House/apartment Stairs: 2 story home with bedroom on 2nd floor  Has following equipment at home: Single point cane  OCCUPATION: Retired  PLOF: Independent in daily activity.  Reported no specific hobbies.   PATIENT GOALS: Reduce pain, walk better.   OBJECTIVE:   PATIENT SURVEYS:  Patient-Specific Activity Scoring Scheme  0 represents "unable to perform." 10 represents "able to perform at prior level. 0 1 2 3 4 5 6 7 8 9  10 (Date and Score)   Activity 07/31/2023  09/06/2023  1. Standing prolonged  0  5  2. walking  3  5  3. stairs 2 2  4.chair transfers 2 5  5.    Score 1.75 avg 4.25 avg   Total score = sum of the activity scores/number of activities Minimum detectable change (90%CI) for average score = 2 points Minimum detectable change (90%CI) for single activity score = 3 points  SCREENING FOR RED FLAGS: 07/31/2023 Bowel or bladder incontinence: No Cauda equina syndrome: No  COGNITION: 07/31/2023 Overall cognitive status: WFL normal      SENSATION: 07/31/2023 No specific testing today  MUSCLE LENGTH: 07/31/2023 No specific measurements.   POSTURE:  07/31/2023 rounded shoulders, forward head, decreased lumbar lordosis, increased thoracic kyphosis, and flexed trunk   PALPATION: 07/31/2023 No specific testing today  LOWER EXTREMITY ROM:      Right 07/31/2023 Left 07/31/2023  Hip flexion    Hip extension    Hip abduction    Hip adduction    Hip internal rotation    Hip external rotation    Knee flexion    Knee extension    Ankle dorsiflexion    Ankle plantarflexion    Ankle inversion    Ankle  eversion     (Blank rows = not tested)  LOWER EXTREMITY MMT:    MMT Right 06/29/2023 Left 06/29/2023 Right 07/31/2023 Left 07/31/2023 Right 08/31/2023 Left 08/31/2023  Hip flexion 4/5 4/5 5/5 4/5    Hip extension        Hip abduction        Hip adduction        Hip internal rotation        Hip external rotation        Knee flexion 4/5 3+/5 5/5 5/5    Knee extension 4/5 3+/5 4/5 4/5 4/5 4/5  Ankle dorsiflexion 4/5 3+/5 4/5 4/5    Ankle plantarflexion        Ankle inversion        Ankle eversion         (  Blank rows = not tested)  SPECIAL TESTS:  07/31/2023 No specific testing today  FUNCTIONAL TESTS:  09/06/2023:  BERG Testing 2 min walk test: 103 ft with [facility]   09/06/23 0001  Berg Balance Test  Sit to Stand 3  Standing Unsupported 4  Sitting with Back Unsupported but Feet Supported on Floor or Stool 4  Stand to Sit 3  Transfers 3  Standing Unsupported with Eyes Closed 4  Standing Unsupported with Feet Together 4  From Standing, Reach Forward with Outstretched Arm 3  From Standing Position, Pick up Object from Floor 1  From Standing Position, Turn to Look Behind Over each Shoulder 2  Turn 360 Degrees 2  Standing Unsupported, Alternately Place Feet on Step/Stool 1  Standing Unsupported, One Foot in Front 3  Standing on One Leg 1  Total Score 38     08/29/2023: TUG with SPC in Rt UE:   31.39 seconds   08/11/2023: 18 inch chair with foam transfers without UE   07/31/2023 TUG with SPC in Rt UE;  34 seconds    07/31/23 0001  Berg Balance Test  Sit to Stand 2  Standing Unsupported 4  Sitting with Back Unsupported but Feet Supported on Floor or Stool 4  Stand to Sit 2  Transfers 3  Standing Unsupported with Eyes Closed 4  Standing Unsupported with Feet Together 3  From Standing, Reach Forward with Outstretched Arm 1  From Standing Position, Pick up Object from Floor 1  From Standing Position, Turn to Look Behind Over each Shoulder 2  Turn 360 Degrees 1  Standing  Unsupported, Alternately Place Feet on Step/Stool 0  Standing Unsupported, One Foot in Front 2  Standing on One Leg 1  Total Score 30    07/03/2023: BERG testing: 38/56  06/29/2023 eval: 5 times sit to stand: 45.62 sec with BUE support 10 meter walk test: 32.85 sec (1 ft/sec)   GAIT: 09/06/2023: Ambulation with SPC, reduced gait speed overall noted still.   07/31/2023 Household distances in clinic c SPC in Rt hand.  Reduced step length noted bilaterally with forward head/trunk flexion, reduced gait speed and wider base of support.                                                                                                                                                                                                                    TODAY'S TREATMENT      DATE:  09/06/2023 Therex: Nustep Lvl 5 UE/LE 10 mins for  aerobic exercise, endurance, ROM Sit to stand to sit with minimal HHA as able x 5 18 inch chair Seated with back support LAQ 1.5 lbs 2 x 15 bilateral Seated with back support marching 1.5 lbs x 15 bilateral    Physical Performance Testing: Berg balance testing instruction/performance.  Additional time due to fatigue and time for instruction required.  2 min walk test with instruction and performance.   TODAY'S TREATMENT      DATE:  09/04/2023 Therex: Nustep Lvl 5 UE/LE 10 mins for aerobic exercise, endurance, ROM  Review of sitting based exercise HEP.   Neuro Re-ed Tandem stance 1 min x 1 bilateral with occasional HHA Feet together stance eyes open 1 min on floor, 1 min on foam with SBA Seated blue band marching alternating 2 x 10 bilateral without backrest on foam pad Seated blue band hip abduction with contralateral isometric hold 2 x 15 bilateral without backrest on foam pad SLS focus with light Rt hand assist on bar 3 cones tapping with contralateral leg x 6 each, performed bilaterally with SBA  TODAY'S TREATMENT      DATE:  08/31/2023 Therex: Nustep Lvl 5  UE/LE 10 mins for aerobic exercise, endurance, ROM Seated leg extension isometric holds in 45 deg knee flexion 5 sec hold x 12 bilateral painfree focus. Time spent in education and recovery time.  Additional time spent in review of exercise routine in sitting.   Neuro Re-ed Seated activity performed without back support, sitting on foam:  Seated marching 2 x 10 bilateral   Seated green band hip clam shell c isometric hold opposite leg 3 x 15 bilateral   Pain neuroscience education regarding chronic pain response/sensitivity increases.  Also discussed muscle adaptation and neuro muscular recruitment improvements and its benefits for symptoms and ability to perform movements. Discussed balance adaptation strategies (ankle, hip, stepping).    TODAY'S TREATMENT      DATE:  08/29/2023 Therex: Nustep Lvl 5 UE/LE 10 mins for aerobic exercise, endurance, ROM Seated yellow ball hip adductor 5 sec hold x 10 without back rest Seated green band hip abduction clam shell x 20 bilateral without back rest  Seated LAQ 2 lb weight x 15 bilateral   Neuro Re-ed Feet together stance with green band pulls 2 x 10  Feet together stance with head turns Lt and Rt x 10 each Feet together stance with head up/down x 10 each    TODAY'S TREATMENT      DATE: 08/25/23 TherEx Seated LAQ 2# bil; 2x10 Seated marching with 2# bil; 2x10 Seated hip abduction L4 band 2x10 Seated hamstring curls L4 band 2x10 Seated hip adduction (ball squeeze) 2x10 Isometric opp arm/leg hip flexion 2x10 bil NuStep L4 x 8 min  PATIENT EDUCATION:  Education details: HEP, POC updates Person educated: Patient Education method: Explanation, Demonstration, Verbal cues, and Handouts Education comprehension: verbalized understanding, returned demonstration, and verbal cues required  HOME EXERCISE PROGRAM: Access Code: [email or web address] URL: [email or web address] Date: 08/16/2023 Prepared by: Terral Ferrari  Program Notes stop if  pain occurs  Exercises - Seated Long Arc Quad  - 1-2 x daily - 7 x weekly - 1-2 sets - 10 reps - 2 hold - Seated Hamstring Curls with Resistance  - 1 x daily - 7 x weekly - 1-2 sets - 10 reps - Seated March  - 1-2 x daily - 7 x weekly - 1-2 sets - 10 reps - Sit to Stand with Counter Support  - 1 x daily - 5 x  weekly - 2 sets - 5 reps - Seated Hip Abduction with Resistance  - 1 x daily - 7 x weekly - 1-2 sets - 10 reps - Small Range Straight Leg Raise  - 2 x daily - 7 x weekly - 3-5 sets - 5 reps - 3 seconds hold  ASSESSMENT:  CLINICAL IMPRESSION: The patient has attended 10 visits since last progress note with completion of the 10 approved [employer] visits today.  Patient has reported overall improvement with global rating of change at +4 moderately better.  See objective data above for updated information regarding current presentation. Berg score was improved as well as TUG from preivous checks recently.  Pt continued to show general muscle weakness/fatigue with balance deficits with history of continued bilateral knee pain to this point. Gains have been noted but continued increased fall risk factors still noted. Medical necessity for continued skilled PT services indicated at this time.     OBJECTIVE IMPAIRMENTS: Abnormal gait, decreased activity tolerance, decreased balance, decreased coordination, decreased endurance, decreased mobility, difficulty walking, decreased ROM, decreased strength, increased fascial restrictions, impaired perceived functional ability, impaired flexibility, impaired UE functional use, improper body mechanics, postural dysfunction, and pain.   ACTIVITY LIMITATIONS: carrying, lifting, bending, sitting, standing, squatting, stairs, transfers, bed mobility, reach over head, hygiene/grooming, and locomotion level  PARTICIPATION LIMITATIONS: meal prep, cleaning, laundry, interpersonal relationship, driving, shopping, and community activity  PERSONAL FACTORS: Medical  history: arthritis, asthma, HTN, hyperlipidemia, memory loss, time since onset, multiple body parts involved are also affecting patient's functional outcome.   REHAB POTENTIAL: Fair to good  CLINICAL DECISION MAKING: Evolving/moderate complexity  EVALUATION COMPLEXITY: Moderate   GOALS: Goals reviewed with patient? Yes  SHORT TERM GOALS: (target date for Short term goals are 3 weeks 08/21/2023)  1. Patient will demonstrate independent use of home exercise program to maintain progress from in clinic treatments.  Goal status: on going 08/16/2023  LONG TERM GOALS: (target dates for all long term goals are 6 weeks  10/18/2023 )   1. Patient will demonstrate/report pain at worst less than or equal to 2/10 to facilitate minimal limitation in daily activity secondary to pain symptoms.  Goal status: revised on 09/06/2023   2. Patient will demonstrate independent use of home exercise program to facilitate ability to maintain/progress functional gains from skilled physical therapy services.  Goal status: revised on 09/06/2023   3. Patient will demonstrate Patient specific functional scale avg > or = 7/10 to indicate reduced disability due to condition.   Goal status: revised on 09/06/2023   4. Patient will demonstrate Randye Buttner testing > 45 to indicate reduced fall risk for daily mobility.   Goal status: revised on 09/06/2023   5.  Patient will demonstrate TUG < 15 seconds to indicate reduced fall risk for daily mobility.   Goal status: revised on 09/06/2023   6.  Patient will demonstrate bilateral LE MMT 5/5 to facilitate transfers, ambulation.  Goal status: revised on 09/06/2023   7.  Patient will demonstrate reciprocal gait pattern up/down stairs with hand rail and cane for household activity.  Goal Status: revised on 09/06/2023  PLAN:  PT FREQUENCY: 2x/week  PT DURATION: 6 weeks  PLANNED INTERVENTIONS: Can include 78469- PT Re-evaluation, 97110-Therapeutic exercises, 97530- Therapeutic  activity, 97112- Neuromuscular re-education, 97535- Self Care, [id number]- Manual therapy, [phone]- Gait training,[id number]- Electrical stimulation (unattended), [id number] Physical performance testing,    Patient/Family education, Balance training, Stair training, Taping, Dry Needling, Joint mobilization, Joint manipulation, Spinal manipulation, Spinal mobilization, Scar mobilization, Vestibular  training, Visual/preceptual remediation/compensation, DME instructions, Cryotherapy, and Moist heat.  All performed as medically necessary.  All included unless contraindicated  PLAN FOR NEXT SESSION: Check on [employer] recert information.    Bonna Bustard, PT, DPT, OCS, ATC 09/06/23  11:08 AM    Asking for recent starting 09/07/2023 - 10/18/2023 for 12 visits.   Referring diagnosis? M25.561,M25.562 (ICD-10-CM) - Acute pain of both knees Treatment diagnosis? (if different than referring diagnosis) M25.562, M25.561, M54.59, M62.81 What was this (referring dx) caused by? []  Surgery []  Fall [x]  Ongoing issue [x]  Arthritis []  Other: ____________  Laterality: []  Rt []  Lt [x]  Both  Check all possible CPT codes:  *CHOOSE 10 OR LESS*    See Planned Interventions listed in the Plan section of the Evaluation.

## 2023-09-11 ENCOUNTER — Encounter (HOSPITAL_COMMUNITY): Payer: Self-pay | Admitting: Emergency Medicine

## 2023-09-11 ENCOUNTER — Emergency Department (HOSPITAL_COMMUNITY)
Admission: EM | Admit: 2023-09-11 | Discharge: 2023-09-12 | Disposition: A | Discharge status: Home / Self Care | Attending: Emergency Medicine | Admitting: Emergency Medicine

## 2023-09-11 ENCOUNTER — Other Ambulatory Visit: Payer: Self-pay

## 2023-09-11 ENCOUNTER — Encounter: Admitting: Rehabilitative and Restorative Service Providers"

## 2023-09-11 DIAGNOSIS — J069 Acute upper respiratory infection, unspecified: Secondary | ICD-10-CM | POA: Diagnosis not present

## 2023-09-11 DIAGNOSIS — Z79899 Other long term (current) drug therapy: Secondary | ICD-10-CM | POA: Diagnosis not present

## 2023-09-11 DIAGNOSIS — R531 Weakness: Secondary | ICD-10-CM | POA: Diagnosis not present

## 2023-09-11 DIAGNOSIS — I10 Essential (primary) hypertension: Secondary | ICD-10-CM | POA: Insufficient documentation

## 2023-09-11 DIAGNOSIS — Z7982 Long term (current) use of aspirin: Secondary | ICD-10-CM | POA: Diagnosis not present

## 2023-09-11 DIAGNOSIS — R059 Cough, unspecified: Secondary | ICD-10-CM | POA: Diagnosis present

## 2023-09-11 DIAGNOSIS — E871 Hypo-osmolality and hyponatremia: Secondary | ICD-10-CM | POA: Diagnosis not present

## 2023-09-11 DIAGNOSIS — E876 Hypokalemia: Secondary | ICD-10-CM | POA: Insufficient documentation

## 2023-09-11 NOTE — ED Triage Notes (Signed)
 Pt BIB EMS from home with c/o being lethargic and cough with cold symptoms x 3 days. 90% on home cpap.  133cbg 112/50 70s NSR 99% on neb  Douneb x 2

## 2023-09-12 ENCOUNTER — Emergency Department (HOSPITAL_COMMUNITY)

## 2023-09-12 LAB — URINALYSIS, W/ REFLEX TO CULTURE (INFECTION SUSPECTED)
Bilirubin Urine: NEGATIVE
Glucose, UA: >=500 mg/dL — AB
Ketones, ur: NEGATIVE mg/dL
Nitrite: NEGATIVE
Protein, ur: 30 mg/dL — AB
Specific Gravity, Urine: 1.023 (ref 1.005–1.030)
pH: 5 (ref 5.0–8.0)

## 2023-09-12 LAB — CBC WITH DIFFERENTIAL/PLATELET
Abs Immature Granulocytes: 0.05 10*3/uL (ref 0.00–0.07)
Basophils Absolute: 0 10*3/uL (ref 0.0–0.1)
Basophils Relative: 0 %
Eosinophils Absolute: 0 10*3/uL (ref 0.0–0.5)
Eosinophils Relative: 0 %
HCT: 33.3 % — ABNORMAL LOW (ref 36.0–46.0)
Hemoglobin: 11.1 g/dL — ABNORMAL LOW (ref 12.0–15.0)
Immature Granulocytes: 1 %
Lymphocytes Relative: 22 %
Lymphs Abs: 1.8 10*3/uL (ref 0.7–4.0)
MCH: 28.5 pg (ref 26.0–34.0)
MCHC: 33.3 g/dL (ref 30.0–36.0)
MCV: 85.6 fL (ref 80.0–100.0)
Monocytes Absolute: 1 10*3/uL (ref 0.1–1.0)
Monocytes Relative: 12 %
Neutro Abs: 5.1 10*3/uL (ref 1.7–7.7)
Neutrophils Relative %: 65 %
Platelets: 169 10*3/uL (ref 150–400)
RBC: 3.89 MIL/uL (ref 3.87–5.11)
RDW: 15.3 % (ref 11.5–15.5)
WBC: 8 10*3/uL (ref 4.0–10.5)
nRBC: 0 % (ref 0.0–0.2)

## 2023-09-12 LAB — COMPREHENSIVE METABOLIC PANEL WITH GFR
ALT: 22 U/L (ref 0–44)
AST: 47 U/L — ABNORMAL HIGH (ref 15–41)
Albumin: 3.1 g/dL — ABNORMAL LOW (ref 3.5–5.0)
Alkaline Phosphatase: 32 U/L — ABNORMAL LOW (ref 38–126)
Anion gap: 14 (ref 5–15)
BUN: 21 mg/dL (ref 8–23)
CO2: 24 mmol/L (ref 22–32)
Calcium: 8.5 mg/dL — ABNORMAL LOW (ref 8.9–10.3)
Chloride: 94 mmol/L — ABNORMAL LOW (ref 98–111)
Creatinine, Ser: 1.06 mg/dL — ABNORMAL HIGH (ref 0.44–1.00)
GFR, Estimated: 52 mL/min — ABNORMAL LOW (ref >60)
Glucose, Bld: 135 mg/dL — ABNORMAL HIGH (ref 70–99)
Potassium: 2.9 mmol/L — ABNORMAL LOW (ref 3.5–5.1)
Sodium: 132 mmol/L — ABNORMAL LOW (ref 135–145)
Total Bilirubin: 1 mg/dL (ref 0.0–1.2)
Total Protein: 6.3 g/dL — ABNORMAL LOW (ref 6.5–8.1)

## 2023-09-12 LAB — RESP PANEL BY RT-PCR (RSV, FLU A&B, COVID)  RVPGX2
Influenza A by PCR: NEGATIVE
Influenza B by PCR: NEGATIVE
Resp Syncytial Virus by PCR: NEGATIVE
SARS Coronavirus 2 by RT PCR: NEGATIVE

## 2023-09-12 LAB — MAGNESIUM: Magnesium: 1.7 mg/dL (ref 1.7–2.4)

## 2023-09-12 LAB — I-STAT CG4 LACTIC ACID, ED: Lactic Acid, Venous: 1.4 mmol/L (ref 0.5–1.9)

## 2023-09-12 MED ORDER — SODIUM CHLORIDE 0.9 % IV BOLUS
1000.0000 mL | Freq: Once | INTRAVENOUS | Status: AC
  Administered 2023-09-12: 1000 mL via INTRAVENOUS

## 2023-09-12 MED ORDER — POTASSIUM CHLORIDE CRYS ER 20 MEQ PO TBCR
40.0000 meq | EXTENDED_RELEASE_TABLET | Freq: Once | ORAL | Status: AC
  Administered 2023-09-12: 40 meq via ORAL
  Filled 2023-09-12: qty 2

## 2023-09-12 MED ORDER — IPRATROPIUM-ALBUTEROL 0.5-2.5 (3) MG/3ML IN SOLN
3.0000 mL | Freq: Once | RESPIRATORY_TRACT | Status: AC
  Administered 2023-09-12: 3 mL via RESPIRATORY_TRACT
  Filled 2023-09-12: qty 3

## 2023-09-12 NOTE — ED Provider Notes (Addendum)
 [place] EMERGENCY DEPARTMENT AT [facility] Provider Note   CSN: [id number] Arrival date & time: 09/11/23  2354     Patient presents with: Weakness   Teresa French is a 83 y.o. female.   The history is provided by the patient and medical records.  Weakness Associated symptoms: cough    83 year old female with history of hyperlipidemia, arthritis, hypertension, sleep apnea on CPAP, presenting to the ED with generally feeling unwell for the past 3 days.  Patient reports she has had fatigue, low energy, and cough productive of brown sputum.  She denies any noted fevers.  Has mostly just been laying in the bed as she has no energy to get up.  She denies any syncopal events.  She does use home CPAP, states she did not feel like she was getting enough air this evening.  No sick contacts.  Denies any chest pain.  No nausea, vomit, or diarrhea.  Prior to Admission medications   Medication Sig Start Date End Date Taking? Authorizing Provider  albuterol  (PROVENTIL ) (2.5 MG/3ML) 0.083% nebulizer solution Take 3 mLs (2.5 mg total) by nebulization 2 (two) times daily as needed for wheezing or shortness of breath. 12/29/22 06/29/23  Neysa Rama D, MD  albuterol  (VENTOLIN  HFA) 108 (90 Base) MCG/ACT inhaler Inhale 2 puffs into the lungs every 4 (four) hours as needed for wheezing or shortness of breath. 06/06/23   Neysa Rama D, MD  aspirin  EC 81 MG tablet Take 81 mg by mouth daily.    [provider]  Calcium  Citrate-Vitamin D (CALCIUM  CITRATE+D3 PETITES) 200-250 MG-UNIT TABS Take by mouth.    [provider]  celecoxib  (CELEBREX ) 100 MG capsule Take 1 capsule (100 mg total) by mouth 2 (two) times daily. Patient not taking: Reported on 06/29/2023 09/25/20     cetirizine (ZYRTEC) 10 MG tablet Take 10 mg by mouth at bedtime. 09/27/18   [provider]  chlorthalidone  (HYGROTON ) 25 MG tablet Take 1 tablet (25 mg total) by mouth every morning. 06/13/23   Ladona Heinz,  MD  Dorzolamide  HCl-Timolol  Mal PF 2-0.5 % SOLN Place 1 drop into affected eye 2 (two) times daily. 01/12/22     fluticasone  (FLONASE) 50 MCG/ACT nasal spray  04/02/18 06/29/23  [provider]  fluticasone  furoate-vilanterol (BREO ELLIPTA ) 200-25 MCG/ACT AEPB Inhale 1 puff then rinse mouth, daily 06/06/23   Neysa Rama D, MD  gabapentin (NEURONTIN) 100 MG capsule Take 100 mg by mouth 3 (three) times daily.    [provider]  JARDIANCE 10 MG TABS tablet Take 10 mg by mouth daily. 06/16/23   [provider]  labetalol  (NORMODYNE ) 200 MG tablet Take 1 tablet (200 mg total) by mouth 2 (two) times daily. 06/13/23   Ladona Heinz, MD  latanoprost  (XALATAN ) 0.005 % ophthalmic solution Place 1 drop into both eyes at bedtime.    [provider]  losartan  (COZAAR ) 50 MG tablet Take 1 tablet (50 mg total) by mouth every evening. 04/16/22   Ladona Heinz, MD  Multiple Vitamin (MULTIVITAMIN) tablet Take 1 tablet by mouth daily.    [provider]  omeprazole (PRILOSEC) 20 MG capsule Take by mouth daily. 01/06/22   [provider]  potassium chloride  (KLOR-CON ) 20 MEQ packet Take 20 mEq by mouth daily.    [provider]  predniSONE  (DELTASONE ) 5 MG tablet Take 5 mg by mouth daily. 03/09/22   [provider]  predniSONE  (DELTASONE ) 50 MG tablet Take one tablet by mouth once daily  for 5 days. 08/25/23   Valdemar Rocky SAUNDERS, NP  pregabalin  (LYRICA ) 25 MG capsule Take 25 mg by mouth daily. Patient not taking: Reported on 06/29/2023 02/26/22   [provider]  REFRESH LIQUIGEL 1 % GEL See admin instructions. 01/08/19   [provider]  rosuvastatin  (CRESTOR ) 20 MG tablet Take 20 mg by mouth daily.    [provider]    Allergies: Patient has no known allergies.    Review of Systems  Respiratory:  Positive for cough.   Neurological:  Positive for weakness.  All other systems reviewed and are negative.   Updated Vital  Signs BP (!) 141/59   Pulse 78   Temp 99.2 F (37.3 C)   Resp (!) 22   Ht 5' 3 (1.6 m)   Wt 73.8 kg   SpO2 100%   BMI 28.82 kg/m   Physical Exam Vitals and nursing note reviewed.  Constitutional:      Appearance: She is well-developed.     Comments: Appears weak/fatigued  HENT:     Head: Normocephalic and atraumatic.   Eyes:     Conjunctiva/sclera: Conjunctivae normal.     Pupils: Pupils are equal, round, and reactive to light.    Cardiovascular:     Rate and Rhythm: Normal rate and regular rhythm.     Heart sounds: Normal heart sounds.  Pulmonary:     Effort: Pulmonary effort is normal.     Breath sounds: Normal breath sounds.  Abdominal:     General: Bowel sounds are normal.     Palpations: Abdomen is soft.   Musculoskeletal:        General: Normal range of motion.     Cervical back: Normal range of motion.   Skin:    General: Skin is warm and dry.   Neurological:     Mental Status: She is alert and oriented to person, place, and time.     (all labs ordered are listed, but only abnormal results are displayed) Labs Reviewed  CBC WITH DIFFERENTIAL/PLATELET - Abnormal; Notable for the following components:      Result Value   Hemoglobin 11.1 (*)    HCT 33.3 (*)    All other components within normal limits  COMPREHENSIVE METABOLIC PANEL WITH GFR - Abnormal; Notable for the following components:   Sodium 132 (*)    Potassium 2.9 (*)    Chloride 94 (*)    Glucose, Bld 135 (*)    Creatinine, Ser 1.06 (*)    Calcium  8.5 (*)    Total Protein 6.3 (*)    Albumin 3.1 (*)    AST 47 (*)    Alkaline Phosphatase 32 (*)    GFR, Estimated 52 (*)    All other components within normal limits  URINALYSIS, W/ REFLEX TO CULTURE (INFECTION SUSPECTED) - Abnormal; Notable for the following components:   Color, Urine AMBER (*)    APPearance CLOUDY (*)    Glucose, UA >=500 (*)    Hgb urine dipstick SMALL (*)    Protein, ur 30 (*)    Leukocytes,Ua TRACE (*)     Bacteria, UA FEW (*)    Non Squamous Epithelial 0-5 (*)    All other components within normal limits  RESP PANEL BY RT-PCR (RSV, FLU A&B, COVID)  RVPGX2  MAGNESIUM  I-STAT CG4 LACTIC ACID, ED    EKG: None  Radiology: [facility] Chest Port 1 View Result Date: 09/12/2023 CLINICAL DATA:  Cough and lethargy for several days EXAM:  PORTABLE CHEST 1 VIEW COMPARISON:  08/24/2023 CT FINDINGS: Cardiac shadow is enlarged but stable. Aortic calcifications are noted. Known cavitary lesion is again noted in the right upper lobe. No sizable effusion is seen. No bony abnormality is noted. IMPRESSION: Stable cavitary lesion in the right upper lobe. Electronically Signed   By: Oneil Devonshire M.D.   On: 09/12/2023 00:24     Procedures   Medications Ordered in the ED - No data to display                                  Medical Decision Making Amount and/or Complexity of Data Reviewed Labs: ordered. Radiology: ordered and independent interpretation performed. ECG/medicine tests: ordered and independent interpretation performed.  Risk Prescription drug management.   83 year old female presenting to the ED with 3 days of URI type symptoms and generalized weakness.  She is afebrile here and nontoxic.  Does appear fatigued and a little weak.  She is hemodynamically stable on room air.  Plan for labs, chest x-ray, RVP.  EKG is nonischemic.  Labs as above--no leukocytosis.  K+ mildly low, given oral replacement.  Mg+ is normal.  RVP is negative.  CXR without acute findings-- stable lesion that is known.  Awaiting UA.  6:11 AM Patient was able to walk to bathroom with family to provide urine sample.  UA without signs of infection-- appears contaminated and RN noted small amount of diarrheal stool in sample.  Now states she is wheezing a bit-- does have some faint wheezes on exam but sats remain 100% and no distress noted.  She does nebs at home PRN.  Given treatment here.  Suspect symptoms are likely viral in  nature which I have discussed with patient and family.  No clear indications for admission, feel she is stable for discharge home.  Encouraged to continue home nebs, hydrate well.  Close follow-up with PCP.  Strict return precautions given for any new/acute changes.  Final diagnoses:  Generalized weakness  Viral URI    ED Discharge Orders     None          Jarold Olam HERO, PA-C 09/12/23 0627    Jarold Olam HERO, PA-C 09/12/23 [id number]    Theadore Ozell HERO, MD 09/12/23 [phone]

## 2023-09-12 NOTE — Discharge Instructions (Addendum)
 Your testing today was overall reassuring.  We suspect you likely have a viral illness. Make sure to rest and hydrate well.  Can continue home nebs and inhalers as needed. Follow-up with your primary care doctor. Return here for new concerns.

## 2023-09-13 ENCOUNTER — Encounter: Admitting: Rehabilitative and Restorative Service Providers"

## 2023-09-14 ENCOUNTER — Encounter: Payer: Self-pay | Admitting: Internal Medicine

## 2023-09-14 ENCOUNTER — Ambulatory Visit: Admitting: Internal Medicine

## 2023-09-14 VITALS — BP 136/62 | HR 66 | Temp 98.2°F | Ht 65.0 in | Wt 171.4 lb

## 2023-09-14 DIAGNOSIS — J209 Acute bronchitis, unspecified: Secondary | ICD-10-CM

## 2023-09-14 DIAGNOSIS — J440 Chronic obstructive pulmonary disease with acute lower respiratory infection: Secondary | ICD-10-CM

## 2023-09-14 MED ORDER — IPRATROPIUM-ALBUTEROL 0.5-2.5 (3) MG/3ML IN SOLN
3.0000 mL | Freq: Four times a day (QID) | RESPIRATORY_TRACT | Status: AC
  Administered 2023-09-14: 3 mL via RESPIRATORY_TRACT

## 2023-09-14 MED ORDER — IPRATROPIUM-ALBUTEROL 0.5-2.5 (3) MG/3ML IN SOLN: 3.0000 mL | RESPIRATORY_TRACT | Status: DC

## 2023-09-14 MED ORDER — PREDNISONE 10 MG PO TABS: ORAL_TABLET | ORAL | 0 refills | Status: DC

## 2023-09-14 MED ORDER — AZITHROMYCIN 250 MG PO TABS: ORAL_TABLET | ORAL | 0 refills | Status: DC

## 2023-09-14 NOTE — Addendum Note (Signed)
 Addended by: Alucard Fearnow M on: 09/14/2023 02:32 PM   Modules accepted: Orders

## 2023-09-14 NOTE — Patient Instructions (Signed)
 Order- neb here DuoNeb    Dx Acute Bronchitis  Script sent for Zpak antibiotic and prednisone  taper When you finish the prednisone  taper, go back to your usual daily prednisone 

## 2023-09-14 NOTE — Progress Notes (Signed)
 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 HST 11/01/20- AHI11.4/ hr, desaturation to 70%, body weight 188 lbs Walk Test on Room Air- 2 laps very slowly - lowest O2 sat 98%, Max Hr 80. Watching her slow walk- issue may be residual stiffness and pain in hips from hx bilateral total hip replacement. Dr Campbell/ ID had told her there was no effective treatment for her atypical AFB infection       ========================================================================================================================================  06/20/23- 82 yoF never smoker, mother of Teresa Bohr, NP, followed for Bronchiectasis/ MAIC/ M abscessus), OSA/ OAP,  Dyspnea,  complicated by  HTN, hxDVT, CAD, Aortic Atherosclerosis, CHF,  Peripheral Neuropathy, Rheumatoid Arthritis , Osteoporosis, Hyperlipidemia, Glaucoma, L Breast Mass, Environmental Allergies, Memory Loss, Covid Infection Jan 2022,   Dr Campbell/ ID had told her there was no effective treatment for her atypical AFB infection                                         - Ventolin  hfa,  Neb albuterol , Flonase, prednisone  5 mg daily,        note Timoptic  Split Night Sleep Study 09/04/22-AHI 16/hr, desat to 69%, body weight 182 lbs       Granddaughter Teresa French here. Body weight today-                     Currently staying with daughter.         CPAP auto 5-15/ [employer]   new 09/13/22 Download compliance- 100%, AHI 0.7/hr She had complained of increased DOE over last 6 months. Dr [facility] suggested walk test. Family don't think she can walk for 6 minutes. Pending update HRCT chest in June to check known cavitary nodules thought to be related to  know M. abscessus NTM. Mild anemia, recent hgb 11.6. She denies cough, wheeze, night sweats, fever, adenopathy. Walk Test on Room Air- 2 laps very slowly - lowest O2 sat 98%, Max Hr 80. Watching her slow walk- issue may be residual stiffness and pain in hips from hx bilateral total hip replacement.  09/14/23- 82 yoF never smoker, mother of Teresa Bohr, NP, followed for Bronchiectasis/ MAIC/ M abscessus), OSA/ OAP,  Dyspnea,  complicated by  HTN, hxDVT, CAD, Aortic Atherosclerosis, CHF,  Peripheral Neuropathy, Rheumatoid Arthritis , Osteoporosis, Hyperlipidemia, Glaucoma, L Breast Mass, Environmental Allergies, Memory Loss, Covid Infection Jan 2022,   Dr Campbell/ ID had told her there was no effective treatment for her atypical AFB infection  (M.abscessus)                                       - Ventolin  hfa,  Neb albuterol , Breo 200,  Flonase, prednisone  5 mg daily,        note Timoptic  ED 6/24- hx 3 days, malaise, weakness, cough productive brown. WBC was 8000, respiratory viral panel neg. CXR stable with cavitary nodule suspected to be atypical infection. K was 2.9> supplemented orally. Sent home presumptive viral URI. Discussed the use of AI scribe software for clinical note transcription with the patient, who gave verbal consent  to proceed.  History of Present Illness   Teresa French is an 83 year old female who presents with wheezing and respiratory symptoms. She is accompanied by her daughter and granddaughter.   An acute illness began 3-4 days ago with weakness, fatigue, exertional dyspnea and cough productive o brown sputum. She experiences persistent wheezing, which improves with home nebulizer use. During a recent emergency room visit, she was discharged with wheezing and chest rattling. She continues to produce brownish sputum, which appears infected. She denies current shortness of breath but recalls previous episodes.  She has a history of COVID-19 hospitalization. Her current  medications include potassium pills, albuterol  nebulizer treatments four times daily, and a daily dose of 5 mg prednisone . She has not been eating well and has experienced occasional diarrhea.     Assessment and Plan:    Acute Bronchitis Wheezing and respiratory symptoms improved but persist with rattling sounds. Shortness of breath improved. Possible viral infection or air quality issues suspected. Chest x-ray showed no significant new findings. - Prescribe prednisone  taper. - Continue albuterol  nebulizer treatments at home. - Administer DuoNeb treatment in office. - Prescribe azithromycin  (Z-Pak) and prednisone  taper.  Cough with brown sputum Persistent cough with brown sputum suggests possible bacterial infection. Respiratory virus panel negative. - Prescribe azithromycin  (Z-Pak).   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 Currently taking potassium pills at home. - Continue potassium supplementation as prescribed.  Diarrhea Intermittent diarrhea possibly related to previous antibiotic use. Azithromycin  chosen for lower gastrointestinal side effects. - Prescribe azithromycin  (Z-Pak).  Side pain Intermittent side pain in L hip area, present for months. Not a new symptom. - Make PCP aware if it continues      ROS-see HPI   + = positive Constitutional:    weight loss, night sweats, fevers, chills, +fatigue, lassitude. HEENT:    headaches, difficulty swallowing, tooth/dental problems, sore throat,       +sneezing, itching, ear ache, +nasal congestion, post nasal drip, snoring CV:    chest pain, orthopnea, PND, +swelling in lower extremities, anasarca,                                   dizziness, palpitations Resp:   +shortness of breath with exertion or at rest.                productive cough,   non-productive cough, coughing up of blood.              change in color of  mucus.  wheezing.   Skin:    +rash or lesions. GI:  No-   heartburn, indigestion, abdominal pain, nausea, vomiting, diarrhea,                 change in bowel habits, loss of appetite GU: dysuria, change in color of urine, no urgency or frequency.   flank pain. MS:   joint pain, stiffness, decreased range of motion, back pain. Neuro-     nothing unusual Psych:  change in mood or affect.  depression or anxiety.   memory loss.  OBJ- Physical Exam General- Alert, Oriented, Affect-appropriate, Distress- none acute, + obese, +walks slowly with cane Skin- rash-none, lesions- none, excoriation- none Lymphadenopathy- none Head- atraumatic            Eyes- Gross vision intact, PERRLA, conjunctivae and secretions clear  Ears- Hearing, canals-normal            Nose- Clear, no-Septal dev, mucus, polyps, erosion, perforation             Throat- Mallampati IV , mucosa clear , drainage- none, tonsils- atrophic, +teeth Neck- flexible , trachea midline, no stridor , thyroid nl, carotid no bruit Chest - symmetrical excursion , unlabored           Heart/CV- RRR , + murmur +1S, no gallop  , no rub, nl s1 s2                           - JVD- none , edema- none, stasis changes- none, varices- none           Lung- wheeze+bilateral, cough+nonproductive here , dullness-none, rub- none           Chest wall-  Abd-  Br/ Gen/ Rectal- Not done, not indicated Extrem- cyanosis- none, clubbing, none, atrophy- none, strength- nl Neuro- grossly intact to observation

## 2023-09-19 ENCOUNTER — Encounter: Admitting: Rehabilitative and Restorative Service Providers"

## 2023-09-20 ENCOUNTER — Telehealth: Payer: Self-pay | Admitting: Internal Medicine

## 2023-09-20 DIAGNOSIS — R0609 Other forms of dyspnea: Secondary | ICD-10-CM

## 2023-09-20 DIAGNOSIS — R2681 Unsteadiness on feet: Secondary | ICD-10-CM

## 2023-09-20 NOTE — Telephone Encounter (Signed)
 Please send order to [facility]- Patient may resume her physical therapy program.

## 2023-09-21 ENCOUNTER — Encounter: Admitting: Rehabilitative and Restorative Service Providers"

## 2023-09-21 ENCOUNTER — Ambulatory Visit
Admission: RE | Admit: 2023-09-21 | Discharge: 2023-09-21 | Disposition: A | Discharge status: Home / Self Care | Source: Ambulatory Visit | Attending: Internal Medicine | Admitting: Internal Medicine

## 2023-09-21 DIAGNOSIS — N6320 Unspecified lump in the left breast, unspecified quadrant: Secondary | ICD-10-CM

## 2023-09-25 ENCOUNTER — Ambulatory Visit (INDEPENDENT_AMBULATORY_CARE_PROVIDER_SITE_OTHER): Admitting: Rehabilitative and Restorative Service Providers"

## 2023-09-25 ENCOUNTER — Encounter: Payer: Self-pay | Admitting: Rehabilitative and Restorative Service Providers"

## 2023-09-25 DIAGNOSIS — M25561 Pain in right knee: Secondary | ICD-10-CM | POA: Diagnosis not present

## 2023-09-25 DIAGNOSIS — M5459 Other low back pain: Secondary | ICD-10-CM

## 2023-09-25 DIAGNOSIS — M6281 Muscle weakness (generalized): Secondary | ICD-10-CM | POA: Diagnosis not present

## 2023-09-25 DIAGNOSIS — M25562 Pain in left knee: Secondary | ICD-10-CM | POA: Diagnosis not present

## 2023-09-25 DIAGNOSIS — G8929 Other chronic pain: Secondary | ICD-10-CM

## 2023-09-25 DIAGNOSIS — R262 Difficulty in walking, not elsewhere classified: Secondary | ICD-10-CM

## 2023-09-25 NOTE — Therapy (Signed)
 OUTPATIENT PHYSICAL THERAPY TREATMENT   Patient Name: Teresa French MRN: [id number] DOB:February 25, 1941, 83 y.o., female Today's Date: 09/25/2023   END OF SESSION:  PT End of Session - 09/25/23 1130     Visit Number 20    Number of Visits 31    Date for PT Re-Evaluation 10/18/23    Authorization Type [employer]-auth submitted    Authorization Time Period 09/07/2023 - 10/18/2023    Authorization - Visit Number 1    Authorization - Number of Visits 12    Progress Note Due on Visit 20    PT Start Time 1140    PT Stop Time 1219    PT Time Calculation (min) 39 min    Activity Tolerance Patient limited by pain;Patient limited by fatigue    Behavior During Therapy [facility] for tasks assessed/performed                    Past Medical History:  Diagnosis Date   Arthritis    Asthma    Borderline diabetes    Breast mass, left 07/10/2017   06/19/17: mammos: lobulated mass sup & lat to nipple; US : 1.4x0.5x1.0 cm  Bi-RADS4   Complication of anesthesia    I SLEEP A VERY VERY LONG TIME   Eczema    Environmental allergies    Glaucoma    History of transfusion    HTN (hypertension), benign 07/10/2017   Hyperlipidemia    Hypertension    Memory loss    Tingling    OF FEET   Past Surgical History:  Procedure Laterality Date   BREAST BIOPSY Bilateral    3-5 EACH BREAST   BREAST CYST ASPIRATION     CERVICAL LAMINECTOMY     DILATION AND CURETTAGE OF UTERUS     GANGLION CYST EXCISION     X3   JOINT REPLACEMENT  2007   left hip   TOTAL HIP ARTHROPLASTY Right 11/21/2014   Procedure: RIGHT TOTAL HIP ARTHROPLASTY ANTERIOR APPROACH;  Surgeon: Lonni CINDERELLA Poli, MD;  Location: [facility];  Service: Orthopedics;  Laterality: Right;   Patient Active Problem List   Diagnosis Date Noted   Bronchiectasis (HCC) 10/28/2020   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   COVID-19 09/16/2020   Asthmatic bronchitis 09/08/2020   OSA (obstructive sleep apnea) 09/08/2020   DVT (deep  venous thrombosis) (HCC) 02/12/2019   Breast mass, left 07/10/2017   HTN (hypertension), benign 07/10/2017   Peripheral neuropathy 03/08/2016   Borderline diabetes    Rheumatoid arthritis (HCC) 10/29/2015   DJD (degenerative joint disease) 10/29/2015   Osteoporosis 10/29/2015   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   SUBJECTIVE:  SUBJECTIVE STATEMENT: Pt indicated having visit to ED end of June. Pt indicated she was having trouble breathing.  Reported knee pain around 8/10 today.  Pt indicated not doing much activity due to breathing/lack of energy.   PERTINENT HISTORY:  Medical history: arthritis, asthma, HTN, hyperlipidemia, memory loss.  Recently seen by [facility] for referral unsteady gait. Today is a continuation of that treatment.   PAIN:  NPRS scale: 8/10 Pain location: knees Pain description: sharp, achy, sore Aggravating factors: WB activity, standing for back Relieving factors: resting, sitting.  Takes medicine for pain  PRECAUTIONS: None  WEIGHT BEARING RESTRICTIONS: No  FALLS:  Has patient fallen in last 6 months? Yes. Number of falls 2 One fall was in the bathroom and then another in a parking lot.  LIVING  ENVIRONMENT: Lives with: lives with their daughter and she lives in [place] and stays with daughter here in [place] Lives in: House/apartment Stairs: 2 story home with bedroom on 2nd floor  Has following equipment at home: Single point cane  OCCUPATION: Retired  PLOF: Independent in daily activity.  Reported no specific hobbies.   PATIENT GOALS: Reduce pain, walk better.   OBJECTIVE:   PATIENT SURVEYS:  Patient-Specific Activity Scoring Scheme  0 represents "unable to perform." 10 represents "able to perform at prior level. 0 1 2 3 4 5 6 7 8 9  10 (Date and Score)   Activity 07/31/2023  09/06/2023  1. Standing prolonged  0  5  2. walking  3  5  3. stairs 2 2  4.chair transfers 2 5  5.    Score 1.75 avg 4.25 avg   Total score = sum of the activity scores/number of activities Minimum detectable change (90%CI) for average score = 2 points Minimum detectable change (90%CI) for single activity score = 3 points  SCREENING FOR RED FLAGS: 07/31/2023 Bowel or bladder incontinence: No Cauda equina syndrome: No  COGNITION: 07/31/2023 Overall cognitive status: WFL normal      SENSATION: 07/31/2023 No specific testing today  MUSCLE LENGTH: 07/31/2023 No specific measurements.   POSTURE:  07/31/2023 rounded shoulders, forward head, decreased lumbar lordosis, increased thoracic kyphosis, and flexed trunk   PALPATION: 07/31/2023 No specific testing today  LOWER EXTREMITY ROM:      Right 07/31/2023 Left 07/31/2023  Hip flexion    Hip extension    Hip abduction    Hip adduction    Hip internal rotation    Hip external rotation    Knee flexion    Knee extension    Ankle dorsiflexion    Ankle plantarflexion    Ankle inversion    Ankle eversion     (Blank rows = not tested)  LOWER EXTREMITY MMT:    MMT Right 06/29/2023 Left 06/29/2023 Right 07/31/2023 Left 07/31/2023 Right 08/31/2023 Left 08/31/2023  Hip flexion 4/5 4/5 5/5 4/5    Hip extension         Hip abduction        Hip adduction        Hip internal rotation        Hip external rotation        Knee flexion 4/5 3+/5 5/5 5/5    Knee extension 4/5 3+/5 4/5 4/5 4/5 4/5  Ankle dorsiflexion 4/5 3+/5 4/5 4/5    Ankle plantarflexion        Ankle inversion        Ankle eversion         (Blank rows = not  tested)  SPECIAL TESTS:  07/31/2023 No specific testing today  FUNCTIONAL TESTS:  09/25/2023: 2 min walk test:  72 ft with SPC TUG with SPC:  51.73 seconds   09/06/2023:  BERG Testing 2 min walk test: 103 ft with [facility]   09/06/23 0001  Berg Balance Test  Sit to Stand 3  Standing Unsupported 4  Sitting with Back Unsupported but Feet Supported on Floor or Stool 4  Stand to Sit 3  Transfers 3  Standing Unsupported with Eyes Closed 4  Standing Unsupported with Feet Together 4  From Standing, Reach Forward with Outstretched Arm 3  From Standing Position, Pick up Object from Floor 1  From Standing Position, Turn to Look Behind Over each Shoulder 2  Turn 360 Degrees 2  Standing Unsupported, Alternately Place Feet on Step/Stool 1  Standing Unsupported, One Foot in Front 3  Standing on One Leg 1  Total Score 38     08/29/2023: TUG with SPC in Rt UE:   31.39 seconds   08/11/2023: 18 inch chair with foam transfers without UE   07/31/2023 TUG with SPC in Rt UE;  34 seconds    07/31/23 0001  Berg Balance Test  Sit to Stand 2  Standing Unsupported 4  Sitting with Back Unsupported but Feet Supported on Floor or Stool 4  Stand to Sit 2  Transfers 3  Standing Unsupported with Eyes Closed 4  Standing Unsupported with Feet Together 3  From Standing, Reach Forward with Outstretched Arm 1  From Standing Position, Pick up Object from Floor 1  From Standing Position, Turn to Look Behind Over each Shoulder 2  Turn 360 Degrees 1  Standing Unsupported, Alternately Place Feet on Step/Stool 0  Standing Unsupported, One Foot in Front 2  Standing on One Leg 1  Total Score 30     07/03/2023: BERG testing: 38/56  06/29/2023 eval: 5 times sit to stand: 45.62 sec with BUE support 10 meter walk test: 32.85 sec (1 ft/sec)   GAIT: 09/06/2023: Ambulation with SPC, reduced gait speed overall noted still.   07/31/2023 Household distances in clinic c SPC in Rt hand.  Reduced step length noted bilaterally with forward head/trunk flexion, reduced gait speed and wider base of support.                                                                                                                                                                                                                    TODAY'S TREATMENT  DATE:  09/25/2023 Therex: Nustep Lvl 5 UE/LE 5 mins, rest 2 mins, 5 mins for aerobic exercise, endurance, ROM Seated yellow ball squeeze hip adduction 5 sec hold x 10 Seated green band hip abduction clam shell c isometric leg hold opposite x 20 bilateral   TherActivity(to improve functional reach, control, and progressive mobility improvements of daily life) Seated back unsupported UE reach 2 lb ball x 10 Seated LE marching to 3 cones to simulate leg movement in/out of car/chair for tranfers x 5 each cone, performed bilaterally    Physical Performance Testing: 2 min walk testing with education of testing and performance - see above for reporting TUG testing with instruction/performance time - see above for reporting.  Additional time was required for instruction and recovery time.      TODAY'S TREATMENT      DATE:  09/06/2023 Therex: Nustep Lvl 5 UE/LE 10 mins for aerobic exercise, endurance, ROM Sit to stand to sit with minimal HHA as able x 5 18 inch chair Seated with back support LAQ 1.5 lbs 2 x 15 bilateral Seated with back support marching 1.5 lbs x 15 bilateral      Physical Performance Testing: Berg balance testing instruction/performance.  Additional time due to fatigue and time for instruction required.  2 min walk test with instruction and  performance.   TODAY'S TREATMENT      DATE:  09/04/2023 Therex: Nustep Lvl 5 UE/LE 10 mins for aerobic exercise, endurance, ROM  Review of sitting based exercise HEP.   Neuro Re-ed Tandem stance 1 min x 1 bilateral with occasional HHA Feet together stance eyes open 1 min on floor, 1 min on foam with SBA Seated blue band marching alternating 2 x 10 bilateral without backrest on foam pad Seated blue band hip abduction with contralateral isometric hold 2 x 15 bilateral without backrest on foam pad SLS focus with light Rt hand assist on bar 3 cones tapping with contralateral leg x 6 each, performed bilaterally with SBA  TODAY'S TREATMENT      DATE:  08/31/2023 Therex: Nustep Lvl 5 UE/LE 10 mins for aerobic exercise, endurance, ROM Seated leg extension isometric holds in 45 deg knee flexion 5 sec hold x 12 bilateral painfree focus. Time spent in education and recovery time.  Additional time spent in review of exercise routine in sitting.   Neuro Re-ed Seated activity performed without back support, sitting on foam:  Seated marching 2 x 10 bilateral   Seated green band hip clam shell c isometric hold opposite leg 3 x 15 bilateral   Pain neuroscience education regarding chronic pain response/sensitivity increases.  Also discussed muscle adaptation and neuro muscular recruitment improvements and its benefits for symptoms and ability to perform movements. Discussed balance adaptation strategies (ankle, hip, stepping).    PATIENT EDUCATION:  Education details: HEP, POC updates Person educated: Patient Education method: Explanation, Demonstration, Verbal cues, and Handouts Education comprehension: verbalized understanding, returned demonstration, and verbal cues required  HOME EXERCISE PROGRAM: Access Code: [email or web address] URL: [email or web address] Date: 08/16/2023 Prepared by: Lamar Ivory  Program Notes stop if pain occurs  Exercises - Seated Long Arc Quad  - 1-2 x daily -  7 x weekly - 1-2 sets - 10 reps - 2 hold - Seated Hamstring Curls with Resistance  - 1 x daily - 7 x weekly - 1-2 sets - 10 reps - Seated March  - 1-2 x daily - 7 x weekly - 1-2 sets - 10 reps - Sit to Stand with Counter  Support  - 1 x daily - 5 x weekly - 2 sets - 5 reps - Seated Hip Abduction with Resistance  - 1 x daily - 7 x weekly - 1-2 sets - 10 reps - Small Range Straight Leg Raise  - 2 x daily - 7 x weekly - 3-5 sets - 5 reps - 3 seconds hold  ASSESSMENT:  CLINICAL IMPRESSION: Noted decreased gait speed today compared to previous and impact on progressive mobility noted in time since last visit.  ED visit and complaints of trouble breathing, etc have impacted current presentation negatively since last visit to clinic.  Continued skilled PT services warranted at this time to continue to progress functional mobility tolerance.   OBJECTIVE IMPAIRMENTS: Abnormal gait, decreased activity tolerance, decreased balance, decreased coordination, decreased endurance, decreased mobility, difficulty walking, decreased ROM, decreased strength, increased fascial restrictions, impaired perceived functional ability, impaired flexibility, impaired UE functional use, improper body mechanics, postural dysfunction, and pain.   ACTIVITY LIMITATIONS: carrying, lifting, bending, sitting, standing, squatting, stairs, transfers, bed mobility, reach over head, hygiene/grooming, and locomotion level  PARTICIPATION LIMITATIONS: meal prep, cleaning, laundry, interpersonal relationship, driving, shopping, and community activity  PERSONAL FACTORS: Medical history: arthritis, asthma, HTN, hyperlipidemia, memory loss, time since onset, multiple body parts involved are also affecting patient's functional outcome.   REHAB POTENTIAL: Fair to good  CLINICAL DECISION MAKING: Evolving/moderate complexity  EVALUATION COMPLEXITY: Moderate   GOALS: Goals reviewed with patient? Yes  SHORT TERM GOALS: (target date for  Short term goals are 3 weeks 08/21/2023)  1. Patient will demonstrate independent use of home exercise program to maintain progress from in clinic treatments.  Goal status: on going 08/16/2023  LONG TERM GOALS: (target dates for all long term goals are 6 weeks  10/18/2023 )   1. Patient will demonstrate/report pain at worst less than or equal to 2/10 to facilitate minimal limitation in daily activity secondary to pain symptoms.  Goal status: on going 09/25/2023   2. Patient will demonstrate independent use of home exercise program to facilitate ability to maintain/progress functional gains from skilled physical therapy services.  Goal status:on going 09/25/2023   3. Patient will demonstrate Patient specific functional scale avg > or = 7/10 to indicate reduced disability due to condition.   Goal status: on going 09/25/2023   4. Patient will demonstrate Lars testing > 45 to indicate reduced fall risk for daily mobility.   Goal status: on going 09/25/2023   5.  Patient will demonstrate TUG < 15 seconds to indicate reduced fall risk for daily mobility.   Goal status: on going 09/25/2023   6.  Patient will demonstrate bilateral LE MMT 5/5 to facilitate transfers, ambulation.  Goal status: on going 09/25/2023   7.  Patient will demonstrate reciprocal gait pattern up/down stairs with hand rail and cane for household activity.  Goal Status: on going 09/25/2023  PLAN:  PT FREQUENCY: 2x/week  PT DURATION: 6 weeks  PLANNED INTERVENTIONS: Can include 02853- PT Re-evaluation, 97110-Therapeutic exercises, 97530- Therapeutic activity, 97112- Neuromuscular re-education, 97535- Self Care, [id number]- Manual therapy, [phone]- Gait training,[id number]- Electrical stimulation (unattended), 97750 Physical performance testing,    Patient/Family education, Balance training, Stair training, Taping, Dry Needling, Joint mobilization, Joint manipulation, Spinal manipulation, Spinal mobilization, Scar mobilization, Vestibular  training, Visual/preceptual remediation/compensation, DME instructions, Cryotherapy, and Moist heat.  All performed as medically necessary.  All included unless contraindicated  PLAN FOR NEXT SESSION: Resume balance, endurance improvements for functional movement gains as tolerated.   Ozell Silvan, PT, DPT,  OCS, ATC 09/25/23  12:16 PM    Referring diagnosis? M25.561,M25.562 (ICD-10-CM) - Acute pain of both knees Treatment diagnosis? (if different than referring diagnosis) M25.562, M25.561, M54.59, M62.81 What was this (referring dx) caused by? []  Surgery []  Fall [x]  Ongoing issue [x]  Arthritis []  Other: ____________  Laterality: []  Rt []  Lt [x]  Both  Check all possible CPT codes:  *CHOOSE 10 OR LESS*    See Planned Interventions listed in the Plan section of the Evaluation.

## 2023-09-26 NOTE — Telephone Encounter (Signed)
 Sending order for patient to resume physical therapy per Dr. Neysa.

## 2023-09-27 ENCOUNTER — Telehealth: Payer: Self-pay | Admitting: Rehabilitative and Restorative Service Providers"

## 2023-09-27 ENCOUNTER — Encounter: Admitting: Rehabilitative and Restorative Service Providers"

## 2023-09-27 NOTE — Therapy (Incomplete)
 OUTPATIENT PHYSICAL THERAPY TREATMENT   Patient Name: Teresa French MRN: [id number] DOB:1940-05-12, 83 y.o., female Today's Date: 09/27/2023   END OF SESSION:              Past Medical History:  Diagnosis Date   Arthritis    Asthma    Borderline diabetes    Breast mass, left 07/10/2017   06/19/17: mammos: lobulated mass sup & lat to nipple; US : 1.4x0.5x1.0 cm  Bi-[id number]   Complication of anesthesia    I SLEEP A VERY VERY LONG TIME   Eczema    Environmental allergies    Glaucoma    History of transfusion    HTN (hypertension), benign 07/10/2017   Hyperlipidemia    Hypertension    Memory loss    Tingling    OF FEET   Past Surgical History:  Procedure Laterality Date   BREAST BIOPSY Bilateral    3-5 EACH BREAST   BREAST CYST ASPIRATION     CERVICAL LAMINECTOMY     DILATION AND CURETTAGE OF UTERUS     GANGLION CYST EXCISION     X3   JOINT REPLACEMENT  2007   left hip   TOTAL HIP ARTHROPLASTY Right 11/21/2014   Procedure: RIGHT TOTAL HIP ARTHROPLASTY ANTERIOR APPROACH;  Surgeon: Lonni CINDERELLA Poli, MD;  Location: [facility];  Service: Orthopedics;  Laterality: Right;   Patient Active Problem List   Diagnosis Date Noted   Bronchiectasis (HCC) 10/28/2020   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   COVID-19 09/16/2020   Asthmatic bronchitis 09/08/2020   OSA (obstructive sleep apnea) 09/08/2020   DVT (deep venous thrombosis) (HCC) 02/12/2019   Breast mass, left 07/10/2017   HTN (hypertension), benign 07/10/2017   Peripheral neuropathy 03/08/2016   Borderline diabetes    Rheumatoid arthritis (HCC) 10/29/2015   DJD (degenerative joint disease) 10/29/2015   Osteoporosis 10/29/2015   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   Osteoarthritis of right hip 11/21/2014   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    PCP: Sim Emery CROME MD  REFERRING PROVIDER: Harden Jerona GAILS, MD   REFERRING DIAG:M25.561,M25.562  (ICD-10-CM) - Acute pain of both knees   Rationale for Evaluation and Treatment: Rehabilitation  THERAPY DIAG:  No diagnosis found.  ONSET DATE: Chronic complaints, worsened over last year.   SUBJECTIVE:                                                                                                                                                                                           SUBJECTIVE STATEMENT: Pt indicated having visit to ED end of June. Pt indicated she was having  trouble breathing.  Reported knee pain around 8/10 today.  Pt indicated not doing much activity due to breathing/lack of energy.   PERTINENT HISTORY:  Medical history: arthritis, asthma, HTN, hyperlipidemia, memory loss.  Recently seen by [facility] for referral unsteady gait. Today is a continuation of that treatment.   PAIN:  NPRS scale: 8/10 Pain location: knees Pain description: sharp, achy, sore Aggravating factors: WB activity, standing for back Relieving factors: resting, sitting.  Takes medicine for pain  PRECAUTIONS: None  WEIGHT BEARING RESTRICTIONS: No  FALLS:  Has patient fallen in last 6 months? Yes. Number of falls 2 One fall was in the bathroom and then another in a parking lot.  LIVING ENVIRONMENT: Lives with: lives with their daughter and she lives in [place] and stays with daughter here in [place] Lives in: House/apartment Stairs: 2 story home with bedroom on 2nd floor  Has following equipment at home: Single point cane  OCCUPATION: Retired  PLOF: Independent in daily activity.  Reported no specific hobbies.   PATIENT GOALS: Reduce pain, walk better.   OBJECTIVE:   PATIENT SURVEYS:  Patient-Specific Activity Scoring Scheme  0 represents "unable to perform." 10 represents "able to perform at prior level. 0 1 2 3 4 5 6 7 8 9  10 (Date and Score)   Activity 07/31/2023  09/06/2023  1. Standing prolonged  0  5  2. walking  3  5  3. stairs 2 2   4.chair transfers 2 5  5.    Score 1.75 avg 4.25 avg   Total score = sum of the activity scores/number of activities Minimum detectable change (90%CI) for average score = 2 points Minimum detectable change (90%CI) for single activity score = 3 points  SCREENING FOR RED FLAGS: 07/31/2023 Bowel or bladder incontinence: No Cauda equina syndrome: No  COGNITION: 07/31/2023 Overall cognitive status: WFL normal      SENSATION: 07/31/2023 No specific testing today  MUSCLE LENGTH: 07/31/2023 No specific measurements.   POSTURE:  07/31/2023 rounded shoulders, forward head, decreased lumbar lordosis, increased thoracic kyphosis, and flexed trunk   PALPATION: 07/31/2023 No specific testing today  LOWER EXTREMITY ROM:      Right 07/31/2023 Left 07/31/2023  Hip flexion    Hip extension    Hip abduction    Hip adduction    Hip internal rotation    Hip external rotation    Knee flexion    Knee extension    Ankle dorsiflexion    Ankle plantarflexion    Ankle inversion    Ankle eversion     (Blank rows = not tested)  LOWER EXTREMITY MMT:    MMT Right 06/29/2023 Left 06/29/2023 Right 07/31/2023 Left 07/31/2023 Right 08/31/2023 Left 08/31/2023  Hip flexion 4/5 4/5 5/5 4/5    Hip extension        Hip abduction        Hip adduction        Hip internal rotation        Hip external rotation        Knee flexion 4/5 3+/5 5/5 5/5    Knee extension 4/5 3+/5 4/5 4/5 4/5 4/5  Ankle dorsiflexion 4/5 3+/5 4/5 4/5    Ankle plantarflexion        Ankle inversion        Ankle eversion         (Blank rows = not tested)  SPECIAL TESTS:  07/31/2023 No specific testing today  FUNCTIONAL TESTS:  09/25/2023: 2 min  walk test:  72 ft with SPC TUG with SPC:  51.73 seconds   09/06/2023:  BERG Testing 2 min walk test: 103 ft with [facility]   09/06/23 0001  Berg Balance Test  Sit to Stand 3  Standing Unsupported 4  Sitting with Back Unsupported but Feet Supported on Floor or Stool 4  Stand to  Sit 3  Transfers 3  Standing Unsupported with Eyes Closed 4  Standing Unsupported with Feet Together 4  From Standing, Reach Forward with Outstretched Arm 3  From Standing Position, Pick up Object from Floor 1  From Standing Position, Turn to Look Behind Over each Shoulder 2  Turn 360 Degrees 2  Standing Unsupported, Alternately Place Feet on Step/Stool 1  Standing Unsupported, One Foot in Front 3  Standing on One Leg 1  Total Score 38     08/29/2023: TUG with SPC in Rt UE:   31.39 seconds   08/11/2023: 18 inch chair with foam transfers without UE   07/31/2023 TUG with SPC in Rt UE;  34 seconds    07/31/23 0001  Berg Balance Test  Sit to Stand 2  Standing Unsupported 4  Sitting with Back Unsupported but Feet Supported on Floor or Stool 4  Stand to Sit 2  Transfers 3  Standing Unsupported with Eyes Closed 4  Standing Unsupported with Feet Together 3  From Standing, Reach Forward with Outstretched Arm 1  From Standing Position, Pick up Object from Floor 1  From Standing Position, Turn to Look Behind Over each Shoulder 2  Turn 360 Degrees 1  Standing Unsupported, Alternately Place Feet on Step/Stool 0  Standing Unsupported, One Foot in Front 2  Standing on One Leg 1  Total Score 30    07/03/2023: BERG testing: 38/56  06/29/2023 eval: 5 times sit to stand: 45.62 sec with BUE support 10 meter walk test: 32.85 sec (1 ft/sec)   GAIT: 09/06/2023: Ambulation with SPC, reduced gait speed overall noted still.   07/31/2023 Household distances in clinic c SPC in Rt hand.  Reduced step length noted bilaterally with forward head/trunk flexion, reduced gait speed and wider base of support.                                                                                                                                                                                                                    TODAY'S TREATMENT      DATE:  09/27/2023 Therex: Nustep Lvl 5 UE/LE 5 mins, rest 2  mins,  5 mins for aerobic exercise, endurance, ROM Seated yellow ball squeeze hip adduction 5 sec hold x 10 Seated green band hip abduction clam shell c isometric leg hold opposite x 20 bilateral   TherActivity(to improve functional reach, control, and progressive mobility improvements of daily life) Seated back unsupported UE reach 2 lb ball x 10 Seated LE marching to 3 cones to simulate leg movement in/out of car/chair for tranfers x 5 each cone, performed bilaterally    TODAY'S TREATMENT      DATE:  09/25/2023 Therex: Nustep Lvl 5 UE/LE 5 mins, rest 2 mins, 5 mins for aerobic exercise, endurance, ROM Seated yellow ball squeeze hip adduction 5 sec hold x 10 Seated green band hip abduction clam shell c isometric leg hold opposite x 20 bilateral   TherActivity(to improve functional reach, control, and progressive mobility improvements of daily life) Seated back unsupported UE reach 2 lb ball x 10 Seated LE marching to 3 cones to simulate leg movement in/out of car/chair for tranfers x 5 each cone, performed bilaterally    Physical Performance Testing: 2 min walk testing with education of testing and performance - see above for reporting TUG testing with instruction/performance time - see above for reporting.  Additional time was required for instruction and recovery time.      TODAY'S TREATMENT      DATE:  09/06/2023 Therex: Nustep Lvl 5 UE/LE 10 mins for aerobic exercise, endurance, ROM Sit to stand to sit with minimal HHA as able x 5 18 inch chair Seated with back support LAQ 1.5 lbs 2 x 15 bilateral Seated with back support marching 1.5 lbs x 15 bilateral      Physical Performance Testing: Berg balance testing instruction/performance.  Additional time due to fatigue and time for instruction required.  2 min walk test with instruction and performance.   TODAY'S TREATMENT      DATE:  09/04/2023 Therex: Nustep Lvl 5 UE/LE 10 mins for aerobic exercise, endurance, ROM  Review  of sitting based exercise HEP.   Neuro Re-ed Tandem stance 1 min x 1 bilateral with occasional HHA Feet together stance eyes open 1 min on floor, 1 min on foam with SBA Seated blue band marching alternating 2 x 10 bilateral without backrest on foam pad Seated blue band hip abduction with contralateral isometric hold 2 x 15 bilateral without backrest on foam pad SLS focus with light Rt hand assist on bar 3 cones tapping with contralateral leg x 6 each, performed bilaterally with SBA  PATIENT EDUCATION:  Education details: HEP, POC updates Person educated: Patient Education method: Explanation, Demonstration, Verbal cues, and Handouts Education comprehension: verbalized understanding, returned demonstration, and verbal cues required  HOME EXERCISE PROGRAM: Access Code: [email or web address] URL: [email or web address] Date: 08/16/2023 Prepared by: Lamar Ivory  Program Notes stop if pain occurs  Exercises - Seated Long Arc Quad  - 1-2 x daily - 7 x weekly - 1-2 sets - 10 reps - 2 hold - Seated Hamstring Curls with Resistance  - 1 x daily - 7 x weekly - 1-2 sets - 10 reps - Seated March  - 1-2 x daily - 7 x weekly - 1-2 sets - 10 reps - Sit to Stand with Counter Support  - 1 x daily - 5 x weekly - 2 sets - 5 reps - Seated Hip Abduction with Resistance  - 1 x daily - 7 x weekly - 1-2 sets - 10 reps - Small Range Straight Leg Raise  - 2 x daily -  7 x weekly - 3-5 sets - 5 reps - 3 seconds hold  ASSESSMENT:  CLINICAL IMPRESSION: Noted decreased gait speed today compared to previous and impact on progressive mobility noted in time since last visit.  ED visit and complaints of trouble breathing, etc have impacted current presentation negatively since last visit to clinic.  Continued skilled PT services warranted at this time to continue to progress functional mobility tolerance.   OBJECTIVE IMPAIRMENTS: Abnormal gait, decreased activity tolerance, decreased balance, decreased  coordination, decreased endurance, decreased mobility, difficulty walking, decreased ROM, decreased strength, increased fascial restrictions, impaired perceived functional ability, impaired flexibility, impaired UE functional use, improper body mechanics, postural dysfunction, and pain.   ACTIVITY LIMITATIONS: carrying, lifting, bending, sitting, standing, squatting, stairs, transfers, bed mobility, reach over head, hygiene/grooming, and locomotion level  PARTICIPATION LIMITATIONS: meal prep, cleaning, laundry, interpersonal relationship, driving, shopping, and community activity  PERSONAL FACTORS: Medical history: arthritis, asthma, HTN, hyperlipidemia, memory loss, time since onset, multiple body parts involved are also affecting patient's functional outcome.   REHAB POTENTIAL: Fair to good  CLINICAL DECISION MAKING: Evolving/moderate complexity  EVALUATION COMPLEXITY: Moderate   GOALS: Goals reviewed with patient? Yes  SHORT TERM GOALS: (target date for Short term goals are 3 weeks 08/21/2023)  1. Patient will demonstrate independent use of home exercise program to maintain progress from in clinic treatments.  Goal status: on going 08/16/2023  LONG TERM GOALS: (target dates for all long term goals are 6 weeks  10/18/2023 )   1. Patient will demonstrate/report pain at worst less than or equal to 2/10 to facilitate minimal limitation in daily activity secondary to pain symptoms.  Goal status: on going 09/25/2023   2. Patient will demonstrate independent use of home exercise program to facilitate ability to maintain/progress functional gains from skilled physical therapy services.  Goal status:on going 09/25/2023   3. Patient will demonstrate Patient specific functional scale avg > or = 7/10 to indicate reduced disability due to condition.   Goal status: on going 09/25/2023   4. Patient will demonstrate Lars testing > 45 to indicate reduced fall risk for daily mobility.   Goal status:  on going 09/25/2023   5.  Patient will demonstrate TUG < 15 seconds to indicate reduced fall risk for daily mobility.   Goal status: on going 09/25/2023   6.  Patient will demonstrate bilateral LE MMT 5/5 to facilitate transfers, ambulation.  Goal status: on going 09/25/2023   7.  Patient will demonstrate reciprocal gait pattern up/down stairs with hand rail and cane for household activity.  Goal Status: on going 09/25/2023  PLAN:  PT FREQUENCY: 2x/week  PT DURATION: 6 weeks  PLANNED INTERVENTIONS: Can include 02853- PT Re-evaluation, 97110-Therapeutic exercises, 97530- Therapeutic activity, 97112- Neuromuscular re-education, 97535- Self Care, [id number]- Manual therapy, [id number]- Gait training,[id number]- Electrical stimulation (unattended), 97750 Physical performance testing,    Patient/Family education, Balance training, Stair training, Taping, Dry Needling, Joint mobilization, Joint manipulation, Spinal manipulation, Spinal mobilization, Scar mobilization, Vestibular training, Visual/preceptual remediation/compensation, DME instructions, Cryotherapy, and Moist heat.  All performed as medically necessary.  All included unless contraindicated  PLAN FOR NEXT SESSION: Resume balance, endurance improvements for functional movement gains as tolerated.   Ozell Silvan, PT, DPT, OCS, ATC 09/27/23  7:59 AM    Referring diagnosis? M25.561,M25.562 (ICD-10-CM) - Acute pain of both knees Treatment diagnosis? (if different than referring diagnosis) M25.562, M25.561, M54.59, M62.81 What was this (referring dx) caused by? []  Surgery []  Fall [x]  Ongoing issue [x]  Arthritis []  Other: ____________  Laterality: []  Rt []  Lt [x]  Both  Check all possible CPT codes:  *CHOOSE 10 OR LESS*    See Planned Interventions listed in the Plan section of the Evaluation.

## 2023-09-27 NOTE — Telephone Encounter (Signed)
 Called patient after 20 mins late for appointment.  Pt indicated she was on her way but got lost on the way.  She mentioned she was going to continue to drive to clinic and reschedule her appointment and add other appointments.   Ozell Silvan, PT, DPT, OCS, ATC 09/27/23  10:36 AM

## 2023-10-02 ENCOUNTER — Encounter: Payer: Self-pay | Admitting: Rehabilitative and Restorative Service Providers"

## 2023-10-02 ENCOUNTER — Ambulatory Visit: Payer: Self-pay | Admitting: Rehabilitative and Restorative Service Providers"

## 2023-10-02 DIAGNOSIS — G8929 Other chronic pain: Secondary | ICD-10-CM

## 2023-10-02 DIAGNOSIS — M25561 Pain in right knee: Secondary | ICD-10-CM

## 2023-10-02 DIAGNOSIS — M5459 Other low back pain: Secondary | ICD-10-CM

## 2023-10-02 DIAGNOSIS — R262 Difficulty in walking, not elsewhere classified: Secondary | ICD-10-CM

## 2023-10-02 DIAGNOSIS — M6281 Muscle weakness (generalized): Secondary | ICD-10-CM

## 2023-10-02 DIAGNOSIS — M25562 Pain in left knee: Secondary | ICD-10-CM

## 2023-10-02 NOTE — Therapy (Signed)
 OUTPATIENT PHYSICAL THERAPY TREATMENT   Patient Name: Teresa French MRN: [id number] DOB:[birth year], 83 y.o., female Today's Date: 10/02/2023   END OF SESSION:  PT End of Session - 10/02/23 1025     Visit Number 21    Number of Visits 31    Date for PT Re-Evaluation 10/18/23    Authorization Type [employer]-auth submitted    Authorization Time Period 09/07/2023 - 10/18/2023    Authorization - Visit Number 2    Authorization - Number of Visits 12    Progress Note Due on Visit 20    PT Start Time 1022    PT Stop Time 1100    PT Time Calculation (min) 38 min    Activity Tolerance Patient limited by pain;Patient limited by fatigue    Behavior During Therapy [facility] for tasks assessed/performed                     Past Medical History:  Diagnosis Date   Arthritis    Asthma    Borderline diabetes    Breast mass, left 07/10/2017   06/19/17: mammos: lobulated mass sup & lat to nipple; US : 1.4x0.5x1.0 cm  Bi-RADS4   Complication of anesthesia    I SLEEP A VERY VERY LONG TIME   Eczema    Environmental allergies    Glaucoma    History of transfusion    HTN (hypertension), benign 07/10/2017   Hyperlipidemia    Hypertension    Memory loss    Tingling    OF FEET   Past Surgical History:  Procedure Laterality Date   BREAST BIOPSY Bilateral    3-5 EACH BREAST   BREAST CYST ASPIRATION     CERVICAL LAMINECTOMY     DILATION AND CURETTAGE OF UTERUS     GANGLION CYST EXCISION     X3   JOINT REPLACEMENT  2007   left hip   TOTAL HIP ARTHROPLASTY Right 11/21/2014   Procedure: RIGHT TOTAL HIP ARTHROPLASTY ANTERIOR APPROACH;  Surgeon: Lonni CINDERELLA Poli, MD;  Location: [facility];  Service: Orthopedics;  Laterality: Right;   Patient Active Problem List   Diagnosis Date Noted   Bronchiectasis (HCC) 10/28/2020   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   COVID-19 09/16/2020   Asthmatic bronchitis 09/08/2020   OSA (obstructive sleep apnea) 09/08/2020   DVT (deep  venous thrombosis) (HCC) 02/12/2019   Breast mass, left 07/10/2017   HTN (hypertension), benign 07/10/2017   Peripheral neuropathy 03/08/2016   Borderline diabetes    Rheumatoid arthritis (HCC) 10/29/2015   DJD (degenerative joint disease) 10/29/2015   Osteoporosis 10/29/2015   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   SUBJECTIVE:  SUBJECTIVE STATEMENT: Pt indicated having visit to ED end of June. Pt indicated she was having trouble breathing.  Reported knee pain around 8/10 today.  Pt indicated not doing much activity due to breathing/lack of energy.   PERTINENT HISTORY:  Medical history: arthritis, asthma, HTN, hyperlipidemia, memory loss.  Recently seen by [facility] for referral unsteady gait. Today is a continuation of that treatment.   PAIN:  NPRS scale: 8/10 Pain location: knees Pain description: sharp, achy, sore Aggravating factors: WB activity, standing for back Relieving factors: resting, sitting.  Takes medicine for pain  PRECAUTIONS: None  WEIGHT BEARING RESTRICTIONS: No  FALLS:  Has patient fallen in last 6 months? Yes. Number of falls 2 One fall was in the bathroom and then another in a parking lot.  LIVING  ENVIRONMENT: Lives with: lives with their daughter and she lives in [place] and stays with daughter here in [place] Lives in: House/apartment Stairs: 2 story home with bedroom on 2nd floor  Has following equipment at home: Single point cane  OCCUPATION: Retired  PLOF: Independent in daily activity.  Reported no specific hobbies.   PATIENT GOALS: Reduce pain, walk better.   OBJECTIVE:   PATIENT SURVEYS:  Patient-Specific Activity Scoring Scheme  0 represents "unable to perform." 10 represents "able to perform at prior level. 0 1 2 3 4 5 6 7 8 9  10 (Date and Score)   Activity 07/31/2023  09/06/2023  1. Standing prolonged  0  5  2. walking  3  5  3. stairs 2 2  4.chair transfers 2 5  5.    Score 1.75 avg 4.25 avg   Total score = sum of the activity scores/number of activities Minimum detectable change (90%CI) for average score = 2 points Minimum detectable change (90%CI) for single activity score = 3 points  SCREENING FOR RED FLAGS: 07/31/2023 Bowel or bladder incontinence: No Cauda equina syndrome: No  COGNITION: 07/31/2023 Overall cognitive status: WFL normal      SENSATION: 07/31/2023 No specific testing today  MUSCLE LENGTH: 07/31/2023 No specific measurements.   POSTURE:  07/31/2023 rounded shoulders, forward head, decreased lumbar lordosis, increased thoracic kyphosis, and flexed trunk   PALPATION: 07/31/2023 No specific testing today  LOWER EXTREMITY ROM:      Right 07/31/2023 Left 07/31/2023  Hip flexion    Hip extension    Hip abduction    Hip adduction    Hip internal rotation    Hip external rotation    Knee flexion    Knee extension    Ankle dorsiflexion    Ankle plantarflexion    Ankle inversion    Ankle eversion     (Blank rows = not tested)  LOWER EXTREMITY MMT:    MMT Right 06/29/2023 Left 06/29/2023 Right 07/31/2023 Left 07/31/2023 Right 08/31/2023 Left 08/31/2023  Hip flexion 4/5 4/5 5/5 4/5    Hip extension         Hip abduction        Hip adduction        Hip internal rotation        Hip external rotation        Knee flexion 4/5 3+/5 5/5 5/5    Knee extension 4/5 3+/5 4/5 4/5 4/5 4/5  Ankle dorsiflexion 4/5 3+/5 4/5 4/5    Ankle plantarflexion        Ankle inversion        Ankle eversion         (Blank rows = not  tested)  SPECIAL TESTS:  07/31/2023 No specific testing today  FUNCTIONAL TESTS:  10/02/2023: Able to perform 18 inch chair with airex pad sit to stand to sit without UE.   09/25/2023: 2 min walk test:  72 ft with SPC TUG with SPC:  51.73 seconds   09/06/2023:  BERG Testing 2 min walk test: 103 ft with [facility]   09/06/23 0001  Berg Balance Test  Sit to Stand 3  Standing Unsupported 4  Sitting with Back Unsupported but Feet Supported on Floor or Stool 4  Stand to Sit 3  Transfers 3  Standing Unsupported with Eyes Closed 4  Standing Unsupported with Feet Together 4  From Standing, Reach Forward with Outstretched Arm 3  From Standing Position, Pick up Object from Floor 1  From Standing Position, Turn to Look Behind Over each Shoulder 2  Turn 360 Degrees 2  Standing Unsupported, Alternately Place Feet on Step/Stool 1  Standing Unsupported, One Foot in Front 3  Standing on One Leg 1  Total Score 38     08/29/2023: TUG with SPC in Rt UE:   31.39 seconds   08/11/2023: 18 inch chair with foam transfers without UE   07/31/2023 TUG with SPC in Rt UE;  34 seconds    07/31/23 0001  Berg Balance Test  Sit to Stand 2  Standing Unsupported 4  Sitting with Back Unsupported but Feet Supported on Floor or Stool 4  Stand to Sit 2  Transfers 3  Standing Unsupported with Eyes Closed 4  Standing Unsupported with Feet Together 3  From Standing, Reach Forward with Outstretched Arm 1  From Standing Position, Pick up Object from Floor 1  From Standing Position, Turn to Look Behind Over each Shoulder 2  Turn 360 Degrees 1  Standing Unsupported, Alternately Place Feet on Step/Stool 0   Standing Unsupported, One Foot in Front 2  Standing on One Leg 1  Total Score 30    07/03/2023: BERG testing: 38/56  06/29/2023 eval: 5 times sit to stand: 45.62 sec with BUE support 10 meter walk test: 32.85 sec (1 ft/sec)   GAIT: 09/06/2023: Ambulation with SPC, reduced gait speed overall noted still.   07/31/2023 Household distances in clinic c SPC in Rt hand.  Reduced step length noted bilaterally with forward head/trunk flexion, reduced gait speed and wider base of support.  TODAY'S TREATMENT      DATE:  10/02/2023 Therex: Seated LAQ 2 lb 2 x 15 bilaterally  Seated alternating marching green band 2 x 10 bilaterally Seated green band hip clam shell c isometric hold contralateral leg x 20 bilateral   TherActivity(to improve functional reach, control, and progressive mobility improvements of daily life) 18 inch chair transfer with airex pad no UE assist with slow lowering focus x5 - additional time for planning and initiation of movement.   Neuro Re-ed Tandem stance in // bars with occasional to moderate HHA 1 min x 1 bilateral Forward BIG inspired stepping x 10 bilateral with CGA and constant cues verbally for activity.  Difficulty in generating initial movement of activity Alternate toe tapping 4 inch step    TODAY'S TREATMENT      DATE:  09/25/2023 Therex: Nustep Lvl 5 UE/LE 5 mins, rest 2 mins, 5 mins for aerobic exercise, endurance, ROM Seated yellow ball squeeze hip adduction 5 sec hold x 10 Seated green band hip abduction clam shell c isometric leg hold opposite x 20 bilateral   TherActivity(to improve functional reach, control, and progressive mobility improvements of daily life) Seated back unsupported UE reach 2 lb ball x 10 Seated LE marching to 3 cones to  simulate leg movement in/out of car/chair for tranfers x 5 each cone, performed bilaterally    Physical Performance Testing: 2 min walk testing with education of testing and performance - see above for reporting TUG testing with instruction/performance time - see above for reporting.  Additional time was required for instruction and recovery time.      TODAY'S TREATMENT      DATE:  09/06/2023 Therex: Nustep Lvl 5 UE/LE 10 mins for aerobic exercise, endurance, ROM Sit to stand to sit with minimal HHA as able x 5 18 inch chair Seated with back support LAQ 1.5 lbs 2 x 15 bilateral Seated with back support marching 1.5 lbs x 15 bilateral      Physical Performance Testing: Berg balance testing instruction/performance.  Additional time due to fatigue and time for instruction required.  2 min walk test with instruction and performance.   TODAY'S TREATMENT      DATE:  09/04/2023 Therex: Nustep Lvl 5 UE/LE 10 mins for aerobic exercise, endurance, ROM  Review of sitting based exercise HEP.   Neuro Re-ed Tandem stance 1 min x 1 bilateral with occasional HHA Feet together stance eyes open 1 min on floor, 1 min on foam with SBA Seated blue band marching alternating 2 x 10 bilateral without backrest on foam pad Seated blue band hip abduction with contralateral isometric hold 2 x 15 bilateral without backrest on foam pad SLS focus with light Rt hand assist on bar 3 cones tapping with contralateral leg x 6 each, performed bilaterally with SBA  PATIENT EDUCATION:  Education details: HEP, POC updates Person educated: Patient Education method: Explanation, Demonstration, Verbal cues, and Handouts Education comprehension: verbalized understanding, returned demonstration, and verbal cues required  HOME EXERCISE PROGRAM: Access Code: [email or web address] URL: [email or web address] Date: 08/16/2023 Prepared by: Lamar Ivory  Program Notes stop if pain occurs  Exercises - Seated Long Arc  Quad  - 1-2 x daily - 7 x weekly - 1-2 sets - 10 reps - 2 hold - Seated Hamstring Curls with Resistance  - 1 x daily - 7 x weekly - 1-2 sets - 10 reps - Seated March  - 1-2 x daily - 7 x weekly - 1-2 sets - 10 reps - Sit  to Stand with Counter Support  - 1 x daily - 5 x weekly - 2 sets - 5 reps - Seated Hip Abduction with Resistance  - 1 x daily - 7 x weekly - 1-2 sets - 10 reps - Small Range Straight Leg Raise  - 2 x daily - 7 x weekly - 3-5 sets - 5 reps - 3 seconds hold  ASSESSMENT:  CLINICAL IMPRESSION: Pt demonstrated difficulty/hesitancy in planning in movements that challenged balance and strength control in LE in standing.  Knee pain continued to show present and limiting in WB activity.  Fatigue still noted as well.  Continued skilled PT services indicated at this time.   OBJECTIVE IMPAIRMENTS: Abnormal gait, decreased activity tolerance, decreased balance, decreased coordination, decreased endurance, decreased mobility, difficulty walking, decreased ROM, decreased strength, increased fascial restrictions, impaired perceived functional ability, impaired flexibility, impaired UE functional use, improper body mechanics, postural dysfunction, and pain.   ACTIVITY LIMITATIONS: carrying, lifting, bending, sitting, standing, squatting, stairs, transfers, bed mobility, reach over head, hygiene/grooming, and locomotion level  PARTICIPATION LIMITATIONS: meal prep, cleaning, laundry, interpersonal relationship, driving, shopping, and community activity  PERSONAL FACTORS: Medical history: arthritis, asthma, HTN, hyperlipidemia, memory loss, time since onset, multiple body parts involved are also affecting patient's functional outcome.   REHAB POTENTIAL: Fair to good  CLINICAL DECISION MAKING: Evolving/moderate complexity  EVALUATION COMPLEXITY: Moderate   GOALS: Goals reviewed with patient? Yes  SHORT TERM GOALS: (target date for Short term goals are 3 weeks 08/21/2023)  1. Patient will  demonstrate independent use of home exercise program to maintain progress from in clinic treatments.  Goal status: on going 08/16/2023  LONG TERM GOALS: (target dates for all long term goals are 6 weeks  10/18/2023 )   1. Patient will demonstrate/report pain at worst less than or equal to 2/10 to facilitate minimal limitation in daily activity secondary to pain symptoms.  Goal status: on going 09/25/2023   2. Patient will demonstrate independent use of home exercise program to facilitate ability to maintain/progress functional gains from skilled physical therapy services.  Goal status:on going 09/25/2023   3. Patient will demonstrate Patient specific functional scale avg > or = 7/10 to indicate reduced disability due to condition.   Goal status: on going 09/25/2023   4. Patient will demonstrate Lars testing > 45 to indicate reduced fall risk for daily mobility.   Goal status: on going 09/25/2023   5.  Patient will demonstrate TUG < 15 seconds to indicate reduced fall risk for daily mobility.   Goal status: on going 09/25/2023   6.  Patient will demonstrate bilateral LE MMT 5/5 to facilitate transfers, ambulation.  Goal status: on going 09/25/2023   7.  Patient will demonstrate reciprocal gait pattern up/down stairs with hand rail and cane for household activity.  Goal Status: on going 09/25/2023  PLAN:  PT FREQUENCY: 2x/week  PT DURATION: 6 weeks  PLANNED INTERVENTIONS: Can include 02853- PT Re-evaluation, 97110-Therapeutic exercises, 97530- Therapeutic activity, 97112- Neuromuscular re-education, 97535- Self Care, [id number]- Manual therapy, [phone]- Gait training,[id number]- Electrical stimulation (unattended), 97750 Physical performance testing,    Patient/Family education, Balance training, Stair training, Taping, Dry Needling, Joint mobilization, Joint manipulation, Spinal manipulation, Spinal mobilization, Scar mobilization, Vestibular training, Visual/preceptual remediation/compensation, DME  instructions, Cryotherapy, and Moist heat.  All performed as medically necessary.  All included unless contraindicated  PLAN FOR NEXT SESSION: Continue balance challenges and strengthening as able, watching knee pain response.   Ozell Silvan, PT, DPT, OCS, ATC 10/02/23  10:47  AM    Referring diagnosis? M25.561,M25.562 (ICD-10-CM) - Acute pain of both knees Treatment diagnosis? (if different than referring diagnosis) M25.562, M25.561, M54.59, M62.81 What was this (referring dx) caused by? []  Surgery []  Fall [x]  Ongoing issue [x]  Arthritis []  Other: ____________  Laterality: []  Rt []  Lt [x]  Both  Check all possible CPT codes:  *CHOOSE 10 OR LESS*    See Planned Interventions listed in the Plan section of the Evaluation.

## 2023-10-05 ENCOUNTER — Ambulatory Visit: Admitting: Rehabilitative and Restorative Service Providers"

## 2023-10-05 ENCOUNTER — Encounter: Payer: Self-pay | Admitting: Rehabilitative and Restorative Service Providers"

## 2023-10-05 DIAGNOSIS — G8929 Other chronic pain: Secondary | ICD-10-CM

## 2023-10-05 DIAGNOSIS — M25561 Pain in right knee: Secondary | ICD-10-CM | POA: Diagnosis not present

## 2023-10-05 DIAGNOSIS — M6281 Muscle weakness (generalized): Secondary | ICD-10-CM

## 2023-10-05 DIAGNOSIS — M25562 Pain in left knee: Secondary | ICD-10-CM | POA: Diagnosis not present

## 2023-10-05 DIAGNOSIS — M5459 Other low back pain: Secondary | ICD-10-CM | POA: Diagnosis not present

## 2023-10-05 DIAGNOSIS — R262 Difficulty in walking, not elsewhere classified: Secondary | ICD-10-CM

## 2023-10-05 NOTE — Therapy (Signed)
 OUTPATIENT PHYSICAL THERAPY TREATMENT   Patient Name: Teresa French MRN: [id number] DOB:1940-07-16, 83 y.o., female Today's Date: 10/05/2023   END OF SESSION:  PT End of Session - 10/05/23 1343     Visit Number 22    Number of Visits 31    Date for PT Re-Evaluation 10/18/23    Authorization Type [employer]-auth submitted    Authorization Time Period 09/07/2023 - 10/18/2023    Authorization - Number of Visits 12    Progress Note Due on Visit 20    PT Start Time 1342    PT Stop Time 1428    PT Time Calculation (min) 46 min    Activity Tolerance Patient limited by pain;Patient limited by fatigue;No increased pain;Patient tolerated treatment well    Behavior During Therapy [facility] for tasks assessed/performed                      Past Medical History:  Diagnosis Date   Arthritis    Asthma    Borderline diabetes    Breast mass, left 07/10/2017   06/19/17: mammos: lobulated mass sup & lat to nipple; US : 1.4x0.5x1.0 cm  Bi-RADS4   Complication of anesthesia    I SLEEP A VERY VERY LONG TIME   Eczema    Environmental allergies    Glaucoma    History of transfusion    HTN (hypertension), benign 07/10/2017   Hyperlipidemia    Hypertension    Memory loss    Tingling    OF FEET   Past Surgical History:  Procedure Laterality Date   BREAST BIOPSY Bilateral    3-5 EACH BREAST   BREAST CYST ASPIRATION     CERVICAL LAMINECTOMY     DILATION AND CURETTAGE OF UTERUS     GANGLION CYST EXCISION     X3   JOINT REPLACEMENT  2007   left hip   TOTAL HIP ARTHROPLASTY Right 11/21/2014   Procedure: RIGHT TOTAL HIP ARTHROPLASTY ANTERIOR APPROACH;  Surgeon: Lonni CINDERELLA Poli, MD;  Location: [facility];  Service: Orthopedics;  Laterality: Right;   Patient Active Problem List   Diagnosis Date Noted   Bronchiectasis (HCC) 10/28/2020   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   COVID-19 09/16/2020   Asthmatic bronchitis 09/08/2020   OSA (obstructive sleep apnea)  09/08/2020   DVT (deep venous thrombosis) (HCC) 02/12/2019   Breast mass, left 07/10/2017   HTN (hypertension), benign 07/10/2017   Peripheral neuropathy 03/08/2016   Borderline diabetes    Rheumatoid arthritis (HCC) 10/29/2015   DJD (degenerative joint disease) 10/29/2015   Osteoporosis 10/29/2015   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   SUBJECTIVE:  SUBJECTIVE STATEMENT: Teresa French notes little back pain this week.  Her knee pain is constant.  She has been limited with her breathing this week, although she thinks it is getting better.   PERTINENT HISTORY:  Medical history: arthritis, asthma, HTN, hyperlipidemia, memory loss.  Recently seen by [facility] for referral unsteady gait. Today is a continuation of that treatment.   PAIN:  NPRS scale: 8/10 Pain location: Both knees (Lt > Rt) Pain description: sharp, achy, sore Aggravating factors: WB activity, standing for back Relieving factors: resting, sitting.  Takes medicine for pain  PRECAUTIONS: None  WEIGHT BEARING RESTRICTIONS: No  FALLS:  Has patient fallen in last 6 months? Yes. Number of falls 2 One fall was in the bathroom and then another in a parking lot.  LIVING  ENVIRONMENT: Lives with: lives with their daughter and she lives in [place] and stays with daughter here in [place] Lives in: House/apartment Stairs: 2 story home with bedroom on 2nd floor  Has following equipment at home: Single point cane  OCCUPATION: Retired  PLOF: Independent in daily activity.  Reported no specific hobbies.   PATIENT GOALS: Reduce pain, walk better.   OBJECTIVE:   PATIENT SURVEYS:  Patient-Specific Activity Scoring Scheme  0 represents "unable to perform." 10 represents "able to perform at prior level. 0 1 2 3 4 5 6 7 8 9  10 (Date and Score)   Activity 07/31/2023  09/06/2023  1. Standing prolonged  0  5  2. walking  3  5  3. stairs 2 2  4.chair transfers 2 5  5.    Score 1.75 avg 4.25 avg   Total score = sum of the activity scores/number of activities Minimum detectable change (90%CI) for average score = 2 points Minimum detectable change (90%CI) for single activity score = 3 points  SCREENING FOR RED FLAGS: 07/31/2023 Bowel or bladder incontinence: No Cauda equina syndrome: No  COGNITION: 07/31/2023 Overall cognitive status: WFL normal      SENSATION: 07/31/2023 No specific testing today  MUSCLE LENGTH: 07/31/2023 No specific measurements.   POSTURE:  07/31/2023 rounded shoulders, forward head, decreased lumbar lordosis, increased thoracic kyphosis, and flexed trunk   PALPATION: 07/31/2023 No specific testing today  LOWER EXTREMITY ROM:      Right 07/31/2023 Left 07/31/2023  Hip flexion    Hip extension    Hip abduction    Hip adduction    Hip internal rotation    Hip external rotation    Knee flexion    Knee extension    Ankle dorsiflexion    Ankle plantarflexion    Ankle inversion    Ankle eversion     (Blank rows = not tested)  LOWER EXTREMITY MMT:    MMT Right 06/29/2023 Left 06/29/2023 Right 07/31/2023 Left 07/31/2023 Right 08/31/2023 Left 08/31/2023  Hip flexion 4/5 4/5 5/5 4/5    Hip extension         Hip abduction        Hip adduction        Hip internal rotation        Hip external rotation        Knee flexion 4/5 3+/5 5/5 5/5    Knee extension 4/5 3+/5 4/5 4/5 4/5 4/5  Ankle dorsiflexion 4/5 3+/5 4/5 4/5    Ankle plantarflexion        Ankle inversion        Ankle eversion         (Blank rows = not tested)  SPECIAL TESTS:  07/31/2023 No specific testing today  FUNCTIONAL TESTS:  10/02/2023: Able to perform 18 inch chair with airex pad sit to stand to sit without UE.   09/25/2023: 2 min walk test:  72 ft with SPC TUG with SPC:  51.73 seconds   09/06/2023:  BERG Testing 2 min walk test: 103 ft with [facility]   09/06/23 0001  Berg Balance Test  Sit to Stand 3  Standing Unsupported 4  Sitting with Back Unsupported but Feet Supported on Floor or Stool 4  Stand to Sit 3  Transfers 3  Standing Unsupported with Eyes Closed 4  Standing Unsupported with Feet Together 4  From Standing, Reach Forward with Outstretched Arm 3  From Standing Position, Pick up Object from Floor 1  From Standing Position, Turn to Look Behind Over each Shoulder 2  Turn 360 Degrees 2  Standing Unsupported, Alternately Place Feet on Step/Stool 1  Standing Unsupported, One Foot in Front 3  Standing on One Leg 1  Total Score 38     08/29/2023: TUG with SPC in Rt UE:   31.39 seconds   08/11/2023: 18 inch chair with foam transfers without UE   07/31/2023 TUG with SPC in Rt UE;  34 seconds    07/31/23 0001  Berg Balance Test  Sit to Stand 2  Standing Unsupported 4  Sitting with Back Unsupported but Feet Supported on Floor or Stool 4  Stand to Sit 2  Transfers 3  Standing Unsupported with Eyes Closed 4  Standing Unsupported with Feet Together 3  From Standing, Reach Forward with Outstretched Arm 1  From Standing Position, Pick up Object from Floor 1  From Standing Position, Turn to Look Behind Over each Shoulder 2  Turn 360 Degrees 1  Standing Unsupported, Alternately Place Feet on Step/Stool 0   Standing Unsupported, One Foot in Front 2  Standing on One Leg 1  Total Score 30    07/03/2023: BERG testing: 38/56  06/29/2023 eval: 5 times sit to stand: 45.62 sec with BUE support 10 meter walk test: 32.85 sec (1 ft/sec)   GAIT: 09/06/2023: Ambulation with SPC, reduced gait speed overall noted still.   07/31/2023 Household distances in clinic c SPC in Rt hand.  Reduced step length noted bilaterally with forward head/trunk flexion, reduced gait speed and wider base of support.  TODAY'S TREATMENT      DATE:  10/05/2023 Seated straight leg raises 3 sets of 5 for 3 seconds Seated TKE 10 x each side NuStep Level 4 Intervals 6 minutes total with :15 rests for breathing as needed (4 breaks taken)  Functional Activities: Double Leg Press 25# 20 x limited range to minimize left knee cracking   TODAY'S TREATMENT      DATE:  10/02/2023 Therex: Seated LAQ 2 lb 2 x 15 bilaterally  Seated alternating marching green band 2 x 10 bilaterally Seated green band hip clam shell c isometric hold contralateral leg x 20 bilateral   TherActivity(to improve functional reach, control, and progressive mobility improvements of daily life) 18 inch chair transfer with airex pad no UE assist with slow lowering focus x5 - additional time for planning and initiation of movement.   Neuro Re-ed Tandem stance in // bars with occasional to moderate HHA 1 min x 1 bilateral Forward BIG inspired stepping x 10 bilateral with CGA and constant cues verbally for activity.  Difficulty in generating initial movement of activity Alternate toe tapping 4 inch step    TODAY'S TREATMENT      DATE:  09/25/2023 Therex: Nustep Lvl 5 UE/LE 5 mins, rest 2 mins, 5 mins for aerobic exercise, endurance, ROM Seated yellow  ball squeeze hip adduction 5 sec hold x 10 Seated green band hip abduction clam shell c isometric leg hold opposite x 20 bilateral   TherActivity(to improve functional reach, control, and progressive mobility improvements of daily life) Seated back unsupported UE reach 2 lb ball x 10 Seated LE marching to 3 cones to simulate leg movement in/out of car/chair for tranfers x 5 each cone, performed bilaterally    Physical Performance Testing: 2 min walk testing with education of testing and performance - see above for reporting TUG testing with instruction/performance time - see above for reporting.  Additional time was required for instruction and recovery time.    HOME EXERCISE PROGRAM: Access Code: [email or web address] URL: [email or web address] Date: 08/16/2023 Prepared by: Lamar Ivory  Program Notes stop if pain occurs  Exercises - Seated Long Arc Quad  - 1-2 x daily - 7 x weekly - 1-2 sets - 10 reps - 2 hold - Seated Hamstring Curls with Resistance  - 1 x daily - 7 x weekly - 1-2 sets - 10 reps - Seated March  - 1-2 x daily - 7 x weekly - 1-2 sets - 10 reps - Sit to Stand with Counter Support  - 1 x daily - 5 x weekly - 2 sets - 5 reps - Seated Hip Abduction with Resistance  - 1 x daily - 7 x weekly - 1-2 sets - 10 reps - Small Range Straight Leg Raise  - 2 x daily - 7 x weekly - 3-5 sets - 5 reps - 3 seconds hold  ASSESSMENT:  CLINICAL IMPRESSION: Jourdan continues to give great effort with supervised physical therapy.  We had to drop her resistance very low on the leg press to avoid left knee crepitus.  She did an excellent job with seated straight leg raises and other activities without any increased knee crepitus or pain.  Continue quadriceps focused strength work to reduce knee pain and improve weightbearing function.  OBJECTIVE IMPAIRMENTS: Abnormal gait, decreased activity tolerance, decreased balance, decreased coordination, decreased endurance, decreased mobility,  difficulty walking, decreased ROM, decreased strength, increased fascial restrictions, impaired perceived functional ability, impaired flexibility, impaired UE functional use, improper body mechanics, postural dysfunction, and  pain.   ACTIVITY LIMITATIONS: carrying, lifting, bending, sitting, standing, squatting, stairs, transfers, bed mobility, reach over head, hygiene/grooming, and locomotion level  PARTICIPATION LIMITATIONS: meal prep, cleaning, laundry, interpersonal relationship, driving, shopping, and community activity  PERSONAL FACTORS: Medical history: arthritis, asthma, HTN, hyperlipidemia, memory loss, time since onset, multiple body parts involved are also affecting patient's functional outcome.   REHAB POTENTIAL: Fair to good  CLINICAL DECISION MAKING: Evolving/moderate complexity  EVALUATION COMPLEXITY: Moderate   GOALS: Goals reviewed with patient? Yes  SHORT TERM GOALS: (target date for Short term goals are 3 weeks 08/21/2023)  1. Patient will demonstrate independent use of home exercise program to maintain progress from in clinic treatments.  Goal status: Met 10/05/2023  LONG TERM GOALS: (target dates for all long term goals are 6 weeks  10/18/2023 )   1. Patient will demonstrate/report pain at worst less than or equal to 2/10 to facilitate minimal limitation in daily activity secondary to pain symptoms.  Goal status: on going 10/05/2023   2. Patient will demonstrate independent use of home exercise program to facilitate ability to maintain/progress functional gains from skilled physical therapy services.  Goal status: on going 10/05/2023   3. Patient will demonstrate Patient specific functional scale avg > or = 7/10 to indicate reduced disability due to condition.   Goal status: on going 09/25/2023   4. Patient will demonstrate Lars testing > 45 to indicate reduced fall risk for daily mobility.   Goal status: on going 09/25/2023   5.  Patient will demonstrate TUG <  15 seconds to indicate reduced fall risk for daily mobility.   Goal status: on going 09/25/2023   6.  Patient will demonstrate bilateral LE MMT 5/5 to facilitate transfers, ambulation.  Goal status: on going 10/05/2023   7.  Patient will demonstrate reciprocal gait pattern up/down stairs with hand rail and cane for household activity.  Goal Status: on going 10/05/2023  PLAN:  PT FREQUENCY: 2x/week  PT DURATION: 6 weeks  PLANNED INTERVENTIONS: Can include 02853- PT Re-evaluation, 97110-Therapeutic exercises, 97530- Therapeutic activity, 97112- Neuromuscular re-education, 97535- Self Care, [id number]- Manual therapy, [phone]- Gait training,[id number]- Electrical stimulation (unattended), 97750 Physical performance testing,    Patient/Family education, Balance training, Stair training, Taping, Dry Needling, Joint mobilization, Joint manipulation, Spinal manipulation, Spinal mobilization, Scar mobilization, Vestibular training, Visual/preceptual remediation/compensation, DME instructions, Cryotherapy, and Moist heat.  All performed as medically necessary.  All included unless contraindicated  PLAN FOR NEXT SESSION: Continue balance challenges and appropriate quadriceps strengthening as able, watching knee pain response.   Myer LELON Ivory, PT, MPT 10/05/23  4:35 PM    Referring diagnosis? M25.561,M25.562 (ICD-10-CM) - Acute pain of both knees Treatment diagnosis? (if different than referring diagnosis) M25.562, M25.561, M54.59, M62.81 What was this (referring dx) caused by? []  Surgery []  Fall [x]  Ongoing issue [x]  Arthritis []  Other: ____________  Laterality: []  Rt []  Lt [x]  Both  Check all possible CPT codes:  *CHOOSE 10 OR LESS*    See Planned Interventions listed in the Plan section of the Evaluation.

## 2023-10-09 ENCOUNTER — Ambulatory Visit: Admitting: Rehabilitative and Restorative Service Providers"

## 2023-10-09 ENCOUNTER — Encounter: Payer: Self-pay | Admitting: Rehabilitative and Restorative Service Providers"

## 2023-10-09 DIAGNOSIS — M25561 Pain in right knee: Secondary | ICD-10-CM | POA: Diagnosis not present

## 2023-10-09 DIAGNOSIS — M25562 Pain in left knee: Secondary | ICD-10-CM | POA: Diagnosis not present

## 2023-10-09 DIAGNOSIS — M5459 Other low back pain: Secondary | ICD-10-CM

## 2023-10-09 DIAGNOSIS — M6281 Muscle weakness (generalized): Secondary | ICD-10-CM

## 2023-10-09 DIAGNOSIS — G8929 Other chronic pain: Secondary | ICD-10-CM

## 2023-10-09 DIAGNOSIS — R262 Difficulty in walking, not elsewhere classified: Secondary | ICD-10-CM

## 2023-10-09 NOTE — Therapy (Signed)
 OUTPATIENT PHYSICAL THERAPY TREATMENT   Patient Name: Teresa French MRN: [id number] DOB:21-Sep-1940, 83 y.o., female Today's Date: 10/09/2023   END OF SESSION:  PT End of Session - 10/09/23 1604     Visit Number 23    Number of Visits 31    Date for PT Re-Evaluation 10/18/23    Authorization Type [employer]-auth submitted    Authorization Time Period 09/07/2023 - 10/18/2023    Authorization - Visit Number 4    Authorization - Number of Visits 12    Progress Note Due on Visit 20    PT Start Time 1556    PT Stop Time 1635    PT Time Calculation (min) 39 min    Activity Tolerance Patient limited by pain;Patient limited by fatigue    Behavior During Therapy [facility] for tasks assessed/performed                       Past Medical History:  Diagnosis Date   Arthritis    Asthma    Borderline diabetes    Breast mass, left 07/10/2017   06/19/17: mammos: lobulated mass sup & lat to nipple; US : 1.4x0.5x1.0 cm  Bi-RADS4   Complication of anesthesia    I SLEEP A VERY VERY LONG TIME   Eczema    Environmental allergies    Glaucoma    History of transfusion    HTN (hypertension), benign 07/10/2017   Hyperlipidemia    Hypertension    Memory loss    Tingling    OF FEET   Past Surgical History:  Procedure Laterality Date   BREAST BIOPSY Bilateral    3-5 EACH BREAST   BREAST CYST ASPIRATION     CERVICAL LAMINECTOMY     DILATION AND CURETTAGE OF UTERUS     GANGLION CYST EXCISION     X3   JOINT REPLACEMENT  2007   left hip   TOTAL HIP ARTHROPLASTY Right 11/21/2014   Procedure: RIGHT TOTAL HIP ARTHROPLASTY ANTERIOR APPROACH;  Surgeon: Lonni CINDERELLA Poli, MD;  Location: [facility];  Service: Orthopedics;  Laterality: Right;   Patient Active Problem List   Diagnosis Date Noted   Bronchiectasis (HCC) 10/28/2020   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   COVID-19 09/16/2020   Asthmatic bronchitis 09/08/2020   OSA (obstructive sleep apnea) 09/08/2020   DVT  (deep venous thrombosis) (HCC) 02/12/2019   Breast mass, left 07/10/2017   HTN (hypertension), benign 07/10/2017   Peripheral neuropathy 03/08/2016   Borderline diabetes    Rheumatoid arthritis (HCC) 10/29/2015   DJD (degenerative joint disease) 10/29/2015   Osteoporosis 10/29/2015   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   SUBJECTIVE:  SUBJECTIVE STATEMENT: Pt reported feeling ok today until doing some house cleaning which led to 10/10 pain after lunch time.  Reported pain upon arrival today 8/10.  Lt knee greater than Rt.    PERTINENT HISTORY:  Medical history: arthritis, asthma, HTN, hyperlipidemia, memory loss.  Recently seen by [facility] for referral unsteady gait. Today is a continuation of that treatment.   PAIN:  NPRS scale: 8/10 Pain location: Both knees (Lt > Rt) Pain description: sharp, achy, sore Aggravating factors: WB activity, standing for back Relieving factors: resting, sitting.  Takes medicine for pain  PRECAUTIONS: None  WEIGHT BEARING RESTRICTIONS: No  FALLS:  Has patient fallen in last 6 months? Yes. Number of falls 2 One fall was in the bathroom and then another in a parking lot.  LIVING  ENVIRONMENT: Lives with: lives with their daughter and she lives in [place] and stays with daughter here in [place] Lives in: House/apartment Stairs: 2 story home with bedroom on 2nd floor  Has following equipment at home: Single point cane  OCCUPATION: Retired  PLOF: Independent in daily activity.  Reported no specific hobbies.   PATIENT GOALS: Reduce pain, walk better.   OBJECTIVE:   PATIENT SURVEYS:  Patient-Specific Activity Scoring Scheme  0 represents "unable to perform." 10 represents "able to perform at prior level. 0 1 2 3 4 5 6 7 8 9  10 (Date and Score)   Activity 07/31/2023  09/06/2023  1. Standing prolonged  0  5  2. walking  3  5  3. stairs 2 2  4.chair transfers 2 5  5.    Score 1.75 avg 4.25 avg   Total score = sum of the activity scores/number of activities Minimum detectable change (90%CI) for average score = 2 points Minimum detectable change (90%CI) for single activity score = 3 points  SCREENING FOR RED FLAGS: 07/31/2023 Bowel or bladder incontinence: No Cauda equina syndrome: No  COGNITION: 07/31/2023 Overall cognitive status: WFL normal      SENSATION: 07/31/2023 No specific testing today  MUSCLE LENGTH: 07/31/2023 No specific measurements.   POSTURE:  07/31/2023 rounded shoulders, forward head, decreased lumbar lordosis, increased thoracic kyphosis, and flexed trunk   PALPATION: 07/31/2023 No specific testing today  LOWER EXTREMITY ROM:      Right 07/31/2023 Left 07/31/2023  Hip flexion    Hip extension    Hip abduction    Hip adduction    Hip internal rotation    Hip external rotation    Knee flexion    Knee extension    Ankle dorsiflexion    Ankle plantarflexion    Ankle inversion    Ankle eversion     (Blank rows = not tested)  LOWER EXTREMITY MMT:    MMT Right 06/29/2023 Left 06/29/2023 Right 07/31/2023 Left 07/31/2023 Right 08/31/2023 Left 08/31/2023  Hip flexion 4/5 4/5 5/5 4/5    Hip extension         Hip abduction        Hip adduction        Hip internal rotation        Hip external rotation        Knee flexion 4/5 3+/5 5/5 5/5    Knee extension 4/5 3+/5 4/5 4/5 4/5 4/5  Ankle dorsiflexion 4/5 3+/5 4/5 4/5    Ankle plantarflexion        Ankle inversion        Ankle eversion         (Blank rows =  not tested)  SPECIAL TESTS:  07/31/2023 No specific testing today  FUNCTIONAL TESTS:  10/02/2023: Able to perform 18 inch chair with airex pad sit to stand to sit without UE.   09/25/2023: 2 min walk test:  72 ft with SPC TUG with SPC:  51.73 seconds   09/06/2023:  BERG Testing 2 min walk test: 103 ft with [facility]   09/06/23 0001  Berg Balance Test  Sit to Stand 3  Standing Unsupported 4  Sitting with Back Unsupported but Feet Supported on Floor or Stool 4  Stand to Sit 3  Transfers 3  Standing Unsupported with Eyes Closed 4  Standing Unsupported with Feet Together 4  From Standing, Reach Forward with Outstretched Arm 3  From Standing Position, Pick up Object from Floor 1  From Standing Position, Turn to Look Behind Over each Shoulder 2  Turn 360 Degrees 2  Standing Unsupported, Alternately Place Feet on Step/Stool 1  Standing Unsupported, One Foot in Front 3  Standing on One Leg 1  Total Score 38     08/29/2023: TUG with SPC in Rt UE:   31.39 seconds   08/11/2023: 18 inch chair with foam transfers without UE   07/31/2023 TUG with SPC in Rt UE;  34 seconds    07/31/23 0001  Berg Balance Test  Sit to Stand 2  Standing Unsupported 4  Sitting with Back Unsupported but Feet Supported on Floor or Stool 4  Stand to Sit 2  Transfers 3  Standing Unsupported with Eyes Closed 4  Standing Unsupported with Feet Together 3  From Standing, Reach Forward with Outstretched Arm 1  From Standing Position, Pick up Object from Floor 1  From Standing Position, Turn to Look Behind Over each Shoulder 2  Turn 360 Degrees 1  Standing Unsupported, Alternately Place Feet on Step/Stool 0   Standing Unsupported, One Foot in Front 2  Standing on One Leg 1  Total Score 30    07/03/2023: BERG testing: 38/56  06/29/2023 eval: 5 times sit to stand: 45.62 sec with BUE support 10 meter walk test: 32.85 sec (1 ft/sec)   GAIT: 09/06/2023: Ambulation with SPC, reduced gait speed overall noted still.   07/31/2023 Household distances in clinic c SPC in Rt hand.  Reduced step length noted bilaterally with forward head/trunk flexion, reduced gait speed and wider base of support.  TODAY'S TREATMENT      DATE:  10/09/2023 Therex: Nustep lvl 5 5 mins, rest break for 2 mins with inhaler use.  Check of pulse ox at 99% while resting.  5 more mins at lvl 4.  94 % oberved at end of 2nd 5 mins.  Seated LAQ with contralateral leg movement opposite with end range pauses x 10 bilateral, 2 lb weight x 10 bilateral  Seated quad set 5 sec hold x 10 bilateral with back support  Seated quad set with SLR 3 x 5, performed bilaterally with back support.  Seated alternating heel/toe lifts x 10 each way with back support.      TODAY'S TREATMENT      DATE:  10/05/2023 Seated straight leg raises 3 sets of 5 for 3 seconds Seated TKE 10 x each side NuStep Level 4 Intervals 6 minutes total with :15 rests for breathing as needed (4 breaks taken)  Functional Activities: Double Leg Press 25# 20 x limited range to minimize left knee cracking   TODAY'S TREATMENT      DATE:  10/02/2023 Therex: Seated LAQ 2 lb 2 x 15 bilaterally  Seated alternating marching green band 2 x 10 bilaterally Seated green band hip clam shell c isometric hold contralateral leg x 20 bilateral   TherActivity(to improve functional reach, control, and progressive mobility improvements of daily life) 18 inch chair transfer with  airex pad no UE assist with slow lowering focus x5 - additional time for planning and initiation of movement.   Neuro Re-ed Tandem stance in // bars with occasional to moderate HHA 1 min x 1 bilateral Forward BIG inspired stepping x 10 bilateral with CGA and constant cues verbally for activity.  Difficulty in generating initial movement of activity Alternate toe tapping 4 inch step    TODAY'S TREATMENT      DATE:  09/25/2023 Therex: Nustep Lvl 5 UE/LE 5 mins, rest 2 mins, 5 mins for aerobic exercise, endurance, ROM Seated yellow ball squeeze hip adduction 5 sec hold x 10 Seated green band hip abduction clam shell c isometric leg hold opposite x 20 bilateral   TherActivity(to improve functional reach, control, and progressive mobility improvements of daily life) Seated back unsupported UE reach 2 lb ball x 10 Seated LE marching to 3 cones to simulate leg movement in/out of car/chair for tranfers x 5 each cone, performed bilaterally    Physical Performance Testing: 2 min walk testing with education of testing and performance - see above for reporting TUG testing with instruction/performance time - see above for reporting.  Additional time was required for instruction and recovery time.    HOME EXERCISE PROGRAM: Access Code: [email or web address] URL: [email or web address] Date: 08/16/2023 Prepared by: Lamar Ivory  Program Notes stop if pain occurs  Exercises - Seated Long Arc Quad  - 1-2 x daily - 7 x weekly - 1-2 sets - 10 reps - 2 hold - Seated Hamstring Curls with Resistance  - 1 x daily - 7 x weekly - 1-2 sets - 10 reps - Seated March  - 1-2 x daily - 7 x weekly - 1-2 sets - 10 reps - Sit to Stand with Counter Support  - 1 x daily - 5 x weekly - 2 sets - 5 reps - Seated Hip Abduction with Resistance  - 1 x daily - 7 x weekly - 1-2 sets - 10 reps - Small Range Straight Leg Raise  - 2 x daily - 7 x weekly - 3-5 sets - 5  reps - 3 seconds hold  ASSESSMENT:  CLINICAL  IMPRESSION: Visible worsened gait today with antalgic gait noted more on Lt leg stance.   Continued difficulty with breathing at times with pain limiting activity for knees, still more troublesome since recent ED visit.  Limited standing activity due to knee pain indicated upon arrival today.  Additional time spent in visit for resting, breathing review to improve O2 sats.   OBJECTIVE IMPAIRMENTS: Abnormal gait, decreased activity tolerance, decreased balance, decreased coordination, decreased endurance, decreased mobility, difficulty walking, decreased ROM, decreased strength, increased fascial restrictions, impaired perceived functional ability, impaired flexibility, impaired UE functional use, improper body mechanics, postural dysfunction, and pain.   ACTIVITY LIMITATIONS: carrying, lifting, bending, sitting, standing, squatting, stairs, transfers, bed mobility, reach over head, hygiene/grooming, and locomotion level  PARTICIPATION LIMITATIONS: meal prep, cleaning, laundry, interpersonal relationship, driving, shopping, and community activity  PERSONAL FACTORS: Medical history: arthritis, asthma, HTN, hyperlipidemia, memory loss, time since onset, multiple body parts involved are also affecting patient's functional outcome.   REHAB POTENTIAL: Fair to good  CLINICAL DECISION MAKING: Evolving/moderate complexity  EVALUATION COMPLEXITY: Moderate   GOALS: Goals reviewed with patient? Yes  SHORT TERM GOALS: (target date for Short term goals are 3 weeks 08/21/2023)  1. Patient will demonstrate independent use of home exercise program to maintain progress from in clinic treatments.  Goal status: Met 10/05/2023  LONG TERM GOALS: (target dates for all long term goals are 6 weeks  10/18/2023 )   1. Patient will demonstrate/report pain at worst less than or equal to 2/10 to facilitate minimal limitation in daily activity secondary to pain symptoms.  Goal status: on going 10/05/2023   2. Patient  will demonstrate independent use of home exercise program to facilitate ability to maintain/progress functional gains from skilled physical therapy services.  Goal status: on going 10/05/2023   3. Patient will demonstrate Patient specific functional scale avg > or = 7/10 to indicate reduced disability due to condition.   Goal status: on going 09/25/2023   4. Patient will demonstrate Lars testing > 45 to indicate reduced fall risk for daily mobility.   Goal status: on going 09/25/2023   5.  Patient will demonstrate TUG < 15 seconds to indicate reduced fall risk for daily mobility.   Goal status: on going 09/25/2023   6.  Patient will demonstrate bilateral LE MMT 5/5 to facilitate transfers, ambulation.  Goal status: on going 10/05/2023   7.  Patient will demonstrate reciprocal gait pattern up/down stairs with hand rail and cane for household activity.  Goal Status: on going 10/05/2023  PLAN:  PT FREQUENCY: 2x/week  PT DURATION: 6 weeks  PLANNED INTERVENTIONS: Can include 02853- PT Re-evaluation, 97110-Therapeutic exercises, 97530- Therapeutic activity, 97112- Neuromuscular re-education, 97535- Self Care, [id number]- Manual therapy, [phone]- Gait training,[id number]- Electrical stimulation (unattended), 97750 Physical performance testing,    Patient/Family education, Balance training, Stair training, Taping, Dry Needling, Joint mobilization, Joint manipulation, Spinal manipulation, Spinal mobilization, Scar mobilization, Vestibular training, Visual/preceptual remediation/compensation, DME instructions, Cryotherapy, and Moist heat.  All performed as medically necessary.  All included unless contraindicated  PLAN FOR NEXT SESSION:  Resume balance training as able based off knee symptoms.   Ozell Silvan, PT, DPT, OCS, ATC 10/09/23  4:32 PM      Referring diagnosis? M25.561,M25.562 (ICD-10-CM) - Acute pain of both knees Treatment diagnosis? (if different than referring diagnosis) M25.562, M25.561,  M54.59, M62.81 What was this (referring dx) caused by? []  Surgery []  Fall [x]  Ongoing issue [x]  Arthritis []  Other:  ____________  Laterality: []  Rt []  Lt [x]  Both  Check all possible CPT codes:  *CHOOSE 10 OR LESS*    See Planned Interventions listed in the Plan section of the Evaluation.

## 2023-10-18 ENCOUNTER — Ambulatory Visit: Admitting: Rehabilitative and Restorative Service Providers"

## 2023-10-18 ENCOUNTER — Encounter: Payer: Self-pay | Admitting: Rehabilitative and Restorative Service Providers"

## 2023-10-18 DIAGNOSIS — M6281 Muscle weakness (generalized): Secondary | ICD-10-CM

## 2023-10-18 DIAGNOSIS — M25562 Pain in left knee: Secondary | ICD-10-CM | POA: Diagnosis not present

## 2023-10-18 DIAGNOSIS — R262 Difficulty in walking, not elsewhere classified: Secondary | ICD-10-CM

## 2023-10-18 DIAGNOSIS — M5459 Other low back pain: Secondary | ICD-10-CM | POA: Diagnosis not present

## 2023-10-18 DIAGNOSIS — M25561 Pain in right knee: Secondary | ICD-10-CM | POA: Diagnosis not present

## 2023-10-18 DIAGNOSIS — G8929 Other chronic pain: Secondary | ICD-10-CM

## 2023-10-18 NOTE — Therapy (Signed)
 OUTPATIENT PHYSICAL THERAPY TREATMENT   Patient Name: Teresa French MRN: [id number] DOB:April 10, 1940, 83 y.o., female Today's Date: 10/18/2023   END OF SESSION:  PT End of Session - 10/18/23 1021     Visit Number 24    Number of Visits 31    Date for PT Re-Evaluation 10/18/23    Authorization Type [employer]-auth submitted    Authorization Time Period 09/07/2023 - 10/18/2023    Authorization - Visit Number 5    Authorization - Number of Visits 12    Progress Note Due on Visit 20    PT Start Time 1017    PT Stop Time 1056    PT Time Calculation (min) 39 min    Activity Tolerance Patient limited by pain;Patient limited by fatigue    Behavior During Therapy [facility] for tasks assessed/performed                        Past Medical History:  Diagnosis Date   Arthritis    Asthma    Borderline diabetes    Breast mass, left 07/10/2017   06/19/17: mammos: lobulated mass sup & lat to nipple; US : 1.4x0.5x1.0 cm  Bi-RADS4   Complication of anesthesia    I SLEEP A VERY VERY LONG TIME   Eczema    Environmental allergies    Glaucoma    History of transfusion    HTN (hypertension), benign 07/10/2017   Hyperlipidemia    Hypertension    Memory loss    Tingling    OF FEET   Past Surgical History:  Procedure Laterality Date   BREAST BIOPSY Bilateral    3-5 EACH BREAST   BREAST CYST ASPIRATION     CERVICAL LAMINECTOMY     DILATION AND CURETTAGE OF UTERUS     GANGLION CYST EXCISION     X3   JOINT REPLACEMENT  2007   left hip   TOTAL HIP ARTHROPLASTY Right 11/21/2014   Procedure: RIGHT TOTAL HIP ARTHROPLASTY ANTERIOR APPROACH;  Surgeon: Lonni CINDERELLA Poli, MD;  Location: [facility];  Service: Orthopedics;  Laterality: Right;   Patient Active Problem List   Diagnosis Date Noted   Bronchiectasis (HCC) 10/28/2020   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   COVID-19 09/16/2020   Asthmatic bronchitis 09/08/2020   OSA (obstructive sleep apnea) 09/08/2020   DVT  (deep venous thrombosis) (HCC) 02/12/2019   Breast mass, left 07/10/2017   HTN (hypertension), benign 07/10/2017   Peripheral neuropathy 03/08/2016   Borderline diabetes    Rheumatoid arthritis (HCC) 10/29/2015   DJD (degenerative joint disease) 10/29/2015   Osteoporosis 10/29/2015   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   SUBJECTIVE:  SUBJECTIVE STATEMENT: Pt indicated doing some more walking with walker.  Reported today knees are better than yesterday.  Reported 10/10 yesterday.  Reported getting more during the day.     PERTINENT HISTORY:  Medical history: arthritis, asthma, HTN, hyperlipidemia, memory loss.  Recently seen by [facility] for referral unsteady gait. Today is a continuation of that treatment.   PAIN:  NPRS scale: 8/10 Pain location: Both knees (Lt > Rt) Pain description: sharp, achy, sore Aggravating factors: WB activity, standing for back Relieving factors: resting, sitting.  Takes medicine for pain  PRECAUTIONS: None  WEIGHT BEARING RESTRICTIONS: No  FALLS:  Has patient fallen in last 6 months? Yes. Number of falls 2 One fall was in the bathroom and then another in a parking lot.  LIVING ENVIRONMENT: Lives  with: lives with their daughter and she lives in [place] and stays with daughter here in [place] Lives in: House/apartment Stairs: 2 story home with bedroom on 2nd floor  Has following equipment at home: Single point cane  OCCUPATION: Retired  PLOF: Independent in daily activity.  Reported no specific hobbies.   PATIENT GOALS: Reduce pain, walk better.   OBJECTIVE:   PATIENT SURVEYS:  Patient-Specific Activity Scoring Scheme  0 represents "unable to perform." 10 represents "able to perform at prior level. 0 1 2 3 4 5 6 7 8 9  10 (Date and Score)   Activity 07/31/2023  09/06/2023  1. Standing prolonged  0  5  2. walking  3  5  3. stairs 2 2  4.chair transfers 2 5  5.    Score 1.75 avg 4.25 avg   Total score = sum of the activity scores/number of activities Minimum detectable change (90%CI) for average score = 2 points Minimum detectable change (90%CI) for single activity score = 3 points  SCREENING FOR RED FLAGS: 07/31/2023 Bowel or bladder incontinence: No Cauda equina syndrome: No  COGNITION: 07/31/2023 Overall cognitive status: WFL normal      SENSATION: 07/31/2023 No specific testing today  MUSCLE LENGTH: 07/31/2023 No specific measurements.   POSTURE:  07/31/2023 rounded shoulders, forward head, decreased lumbar lordosis, increased thoracic kyphosis, and flexed trunk   PALPATION: 07/31/2023 No specific testing today  LOWER EXTREMITY ROM:      Right 07/31/2023 Left 07/31/2023  Hip flexion    Hip extension    Hip abduction    Hip adduction    Hip internal rotation    Hip external rotation    Knee flexion    Knee extension    Ankle dorsiflexion    Ankle plantarflexion    Ankle inversion    Ankle eversion     (Blank rows = not tested)  LOWER EXTREMITY MMT:    MMT Right 06/29/2023 Left 06/29/2023 Right 07/31/2023 Left 07/31/2023 Right 08/31/2023 Left 08/31/2023  Hip flexion 4/5 4/5 5/5 4/5    Hip extension        Hip abduction         Hip adduction        Hip internal rotation        Hip external rotation        Knee flexion 4/5 3+/5 5/5 5/5    Knee extension 4/5 3+/5 4/5 4/5 4/5 4/5  Ankle dorsiflexion 4/5 3+/5 4/5 4/5    Ankle plantarflexion        Ankle inversion        Ankle eversion         (Blank rows = not tested)  SPECIAL TESTS:  07/31/2023 No specific testing today  FUNCTIONAL TESTS:  10/18/2023: TUG with FWW:  29.5 seconds  10/02/2023: Able to perform 18 inch chair with airex pad sit to stand to sit without UE.   09/25/2023: 2 min walk test:  72 ft with SPC TUG with SPC:  51.73 seconds   09/06/2023:  BERG Testing 2 min walk test: 103 ft with [facility]   09/06/23 0001  Berg Balance Test  Sit to Stand 3  Standing Unsupported 4  Sitting with Back Unsupported but Feet Supported on Floor or Stool 4  Stand to Sit 3  Transfers 3  Standing Unsupported with Eyes Closed 4  Standing Unsupported with Feet Together 4  From Standing, Reach Forward with Outstretched Arm 3  From Standing Position, Pick up Object from Floor 1  From Standing Position, Turn to Look Behind Over each Shoulder 2  Turn 360 Degrees 2  Standing Unsupported, Alternately Place Feet on Step/Stool 1  Standing Unsupported, One Foot in Front 3  Standing on One Leg 1  Total Score 38     08/29/2023: TUG with SPC in Rt UE:   31.39 seconds   08/11/2023: 18 inch chair with foam transfers without UE   07/31/2023 TUG with SPC in Rt UE;  34 seconds    07/31/23 0001  Berg Balance Test  Sit to Stand 2  Standing Unsupported 4  Sitting with Back Unsupported but Feet Supported on Floor or Stool 4  Stand to Sit 2  Transfers 3  Standing Unsupported with Eyes Closed 4  Standing Unsupported with Feet Together 3  From Standing, Reach Forward with Outstretched Arm 1  From Standing Position, Pick up Object from Floor 1  From Standing Position, Turn to Look Behind Over each Shoulder 2  Turn 360 Degrees 1  Standing Unsupported, Alternately Place  Feet on Step/Stool 0  Standing Unsupported, One Foot in Front 2  Standing on One Leg 1  Total Score 30    07/03/2023: BERG testing: 38/56  06/29/2023 eval: 5 times sit to stand: 45.62 sec with BUE support 10 meter walk test: 32.85 sec (1 ft/sec)   GAIT: 10/18/2023: Ambulation in clinic with FWW with improved gait speed compared to [facility] use.    09/06/2023: Ambulation with SPC, reduced gait speed overall noted still.   07/31/2023 Household distances in clinic c SPC in Rt hand.  Reduced step length noted bilaterally with forward head/trunk flexion, reduced gait speed and wider base of support.  TODAY'S TREATMENT      DATE:  10/18/2023 Therex: Seated marching alternating to 4 inch step x 10 bilateral  Nustep Lvl 5 10 mins UE/LE for endurance, Rom.    Neuro Re-ed Forward stepping with weight shift to front leg with verbal and tactile cues for initiate stepping.  CGA to min A at times in // bars x 10 bilateral  Retro stepping with weight shift to front leg with verbal and tactile cues for initiate stepping.  CGA to min A at times in // bars x 10 bilateral  Tandem stance modified with front foot elevated step forward (not in line) 1 min x 1 bilateral with CGA  TherActivity Sit to stand to sit from 18 inch chair with minimal HHA x 12 throughout visit.  Sit to stand TUG x 1 c FWW - used to analysis and movement practice.    TODAY'S TREATMENT      DATE:  10/09/2023 Therex: Nustep lvl 5 5 mins, rest break for 2 mins with inhaler use.  Check of pulse ox at 99% while resting.  5 more mins at lvl 4.  94 % oberved at end of 2nd 5 mins.  Seated LAQ with contralateral leg movement opposite with end range pauses x 10 bilateral, 2 lb weight x 10 bilateral  Seated quad set 5 sec hold x 10  bilateral with back support  Seated quad set with SLR 3 x 5, performed bilaterally with back support.  Seated alternating heel/toe lifts x 10 each way with back support.      TODAY'S TREATMENT      DATE:  10/05/2023 Seated straight leg raises 3 sets of 5 for 3 seconds Seated TKE 10 x each side NuStep Level 4 Intervals 6 minutes total with :15 rests for breathing as needed (4 breaks taken)  Functional Activities: Double Leg Press 25# 20 x limited range to minimize left knee cracking   TODAY'S TREATMENT      DATE:  10/02/2023 Therex: Seated LAQ 2 lb 2 x 15 bilaterally  Seated alternating marching green band 2 x 10 bilaterally Seated green band hip clam shell c isometric hold contralateral leg x 20 bilateral   TherActivity(to improve functional reach, control, and progressive mobility improvements of daily life) 18 inch chair transfer with airex pad no UE assist with slow lowering focus x5 - additional time for planning and initiation of movement.   Neuro Re-ed Tandem stance in // bars with occasional to moderate HHA 1 min x 1 bilateral Forward BIG inspired stepping x 10 bilateral with CGA and constant cues verbally for activity.  Difficulty in generating initial movement of activity Alternate toe tapping 4 inch step    HOME EXERCISE PROGRAM: Access Code: [email or web address] URL: [email or web address] Date: 08/16/2023 Prepared by: Lamar Ivory  Program Notes stop if pain occurs  Exercises - Seated Long Arc Quad  - 1-2 x daily - 7 x weekly - 1-2 sets - 10 reps - 2 hold - Seated Hamstring Curls with Resistance  - 1 x daily - 7 x weekly - 1-2 sets - 10 reps - Seated March  - 1-2 x daily - 7 x weekly - 1-2 sets - 10 reps - Sit to Stand with Counter Support  - 1 x daily - 5 x weekly - 2 sets - 5 reps - Seated Hip Abduction with Resistance  - 1 x daily - 7 x weekly - 1-2 sets - 10 reps - Small Range Straight Leg Raise  - 2 x  daily - 7 x weekly - 3-5 sets - 5 reps - 3 seconds  hold  ASSESSMENT:  CLINICAL IMPRESSION: Stability and gait speed was improved marginally with FWW vs. SPC use.  Continued challenges for weight shifting control and balance to help improve ambulation stability.  LE strengthening as able with mindful thoughts about knee pain increases.  Continued skilled PT services may be beneficial and medically necessary to improve stability and independence in daily activity.    OBJECTIVE IMPAIRMENTS: Abnormal gait, decreased activity tolerance, decreased balance, decreased coordination, decreased endurance, decreased mobility, difficulty walking, decreased ROM, decreased strength, increased fascial restrictions, impaired perceived functional ability, impaired flexibility, impaired UE functional use, improper body mechanics, postural dysfunction, and pain.   ACTIVITY LIMITATIONS: carrying, lifting, bending, sitting, standing, squatting, stairs, transfers, bed mobility, reach over head, hygiene/grooming, and locomotion level  PARTICIPATION LIMITATIONS: meal prep, cleaning, laundry, interpersonal relationship, driving, shopping, and community activity  PERSONAL FACTORS: Medical history: arthritis, asthma, HTN, hyperlipidemia, memory loss, time since onset, multiple body parts involved are also affecting patient's functional outcome.   REHAB POTENTIAL: Fair to good  CLINICAL DECISION MAKING: Evolving/moderate complexity  EVALUATION COMPLEXITY: Moderate   GOALS: Goals reviewed with patient? Yes  SHORT TERM GOALS: (target date for Short term goals are 3 weeks 08/21/2023)  1. Patient will demonstrate independent use of home exercise program to maintain progress from in clinic treatments.  Goal status: Met 10/05/2023  LONG TERM GOALS: (target dates for all long term goals are 6 weeks  10/18/2023 )   1. Patient will demonstrate/report pain at worst less than or equal to 2/10 to facilitate minimal limitation in daily activity secondary to pain  symptoms.  Goal status: on going 10/05/2023   2. Patient will demonstrate independent use of home exercise program to facilitate ability to maintain/progress functional gains from skilled physical therapy services.  Goal status: on going 10/05/2023   3. Patient will demonstrate Patient specific functional scale avg > or = 7/10 to indicate reduced disability due to condition.   Goal status: on going 09/25/2023   4. Patient will demonstrate Lars testing > 45 to indicate reduced fall risk for daily mobility.   Goal status: on going 09/25/2023   5.  Patient will demonstrate TUG < 15 seconds to indicate reduced fall risk for daily mobility.   Goal status: on going 09/25/2023   6.  Patient will demonstrate bilateral LE MMT 5/5 to facilitate transfers, ambulation.  Goal status: on going 10/05/2023   7.  Patient will demonstrate reciprocal gait pattern up/down stairs with hand rail and cane for household activity.  Goal Status: on going 10/05/2023  PLAN:  PT FREQUENCY: 2x/week  PT DURATION: 6 weeks  PLANNED INTERVENTIONS: Can include 02853- PT Re-evaluation, 97110-Therapeutic exercises, 97530- Therapeutic activity, 97112- Neuromuscular re-education, 97535- Self Care, 97140- Manual therapy, [phone]- Gait training,[id number]- Electrical stimulation (unattended), 97750 Physical performance testing,    Patient/Family education, Balance training, Stair training, Taping, Dry Needling, Joint mobilization, Joint manipulation, Spinal manipulation, Spinal mobilization, Scar mobilization, Vestibular training, Visual/preceptual remediation/compensation, DME instructions, Cryotherapy, and Moist heat.  All performed as medically necessary.  All included unless contraindicated  PLAN FOR NEXT SESSION:  [employer] Recert next visit, Recert for POC.   Ozell Silvan, PT, DPT, OCS, ATC 10/18/23  10:58 AM      Referring diagnosis? M25.561,M25.562 (ICD-10-CM) - Acute pain of both knees Treatment diagnosis? (if different  than referring diagnosis) M25.562, M25.561, M54.59, M62.81 What was this (referring dx) caused by? []  Surgery []  Fall [x]   Ongoing issue [x]  Arthritis []  Other: ____________  Laterality: []  Rt []  Lt [x]  Both  Check all possible CPT codes:  *CHOOSE 10 OR LESS*    See Planned Interventions listed in the Plan section of the Evaluation.

## 2023-10-19 ENCOUNTER — Ambulatory Visit: Payer: Self-pay | Admitting: Rehabilitative and Restorative Service Providers"

## 2023-10-19 ENCOUNTER — Encounter: Payer: Self-pay | Admitting: Rehabilitative and Restorative Service Providers"

## 2023-10-19 DIAGNOSIS — M6281 Muscle weakness (generalized): Secondary | ICD-10-CM

## 2023-10-19 DIAGNOSIS — M25561 Pain in right knee: Secondary | ICD-10-CM | POA: Diagnosis not present

## 2023-10-19 DIAGNOSIS — M25562 Pain in left knee: Secondary | ICD-10-CM

## 2023-10-19 DIAGNOSIS — M5459 Other low back pain: Secondary | ICD-10-CM | POA: Diagnosis not present

## 2023-10-19 DIAGNOSIS — R2681 Unsteadiness on feet: Secondary | ICD-10-CM

## 2023-10-19 DIAGNOSIS — G8929 Other chronic pain: Secondary | ICD-10-CM

## 2023-10-19 DIAGNOSIS — R262 Difficulty in walking, not elsewhere classified: Secondary | ICD-10-CM

## 2023-10-19 NOTE — Therapy (Addendum)
 OUTPATIENT PHYSICAL THERAPY TREATMENT / PROGRESS NOTE/ HUMANA RECERT  / DISCHARGE   Patient Name: Teresa French MRN: [id number] DOB:[birth year], 83 y.o., female Today's Date: 10/19/2023  Progress Note Reporting Period 09/06/2023 to 10/19/2023  See note below for Objective Data and Assessment of Progress/Goals.   END OF SESSION:  PT End of Session - 10/19/23 1552     Visit Number 25    Number of Visits 36    Date for PT Re-Evaluation 12/14/23    Authorization Type [employer]-auth submitted    Authorization - Visit Number 1    Progress Note Due on Visit 36   hum[employer] recert vist number   PT Start Time 1519    PT Stop Time 1558    PT Time Calculation (min) 39 min    Activity Tolerance Patient limited by pain;Patient limited by fatigue    Behavior During Therapy [facility] for tasks assessed/performed            Past Medical History:  Diagnosis Date   Arthritis    Asthma    Borderline diabetes    Breast mass, left 07/10/2017   06/19/17: mammos: lobulated mass sup & lat to nipple; US : 1.4x0.5x1.0 cm  Bi-RADS4   Complication of anesthesia    I SLEEP A VERY VERY LONG TIME   Eczema    Environmental allergies    Glaucoma    History of transfusion    HTN (hypertension), benign 07/10/2017   Hyperlipidemia    Hypertension    Memory loss    Tingling    OF FEET   Past Surgical History:  Procedure Laterality Date   BREAST BIOPSY Bilateral    3-5 EACH BREAST   BREAST CYST ASPIRATION     CERVICAL LAMINECTOMY     DILATION AND CURETTAGE OF UTERUS     GANGLION CYST EXCISION     X3   JOINT REPLACEMENT  2007   left hip   TOTAL HIP ARTHROPLASTY Right 11/21/2014   Procedure: RIGHT TOTAL HIP ARTHROPLASTY ANTERIOR APPROACH;  Surgeon: Lonni CINDERELLA Poli, MD;  Location: [facility];  Service: Orthopedics;  Laterality: Right;   Patient Active Problem List   Diagnosis Date Noted   Bronchiectasis (HCC) 10/28/2020   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   COVID-19 09/16/2020    Asthmatic bronchitis 09/08/2020   OSA (obstructive sleep apnea) 09/08/2020   DVT (deep venous thrombosis) (HCC) 02/12/2019   Breast mass, left 07/10/2017   HTN (hypertension), benign 07/10/2017   Peripheral neuropathy 03/08/2016   Borderline diabetes    Rheumatoid arthritis (HCC) 10/29/2015   DJD (degenerative joint disease) 10/29/2015   Osteoporosis 10/29/2015   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   SUBJECTIVE:  SUBJECTIVE STATEMENT: Pt indicated feeling some reduction of pain today but reported always having pain complaints at some level for knees.  Medicine helps a little bit.     PERTINENT HISTORY:  Medical history: arthritis, asthma, HTN, hyperlipidemia, memory loss.  Recently seen by [facility] for referral unsteady gait. Today is a continuation of that treatment.   PAIN:  NPRS scale: upon arrival 8/10.  At best 7/10.  Pain location: Both knees (Lt > Rt) Pain description: sharp, achy, sore Aggravating factors: WB activity, standing for back Relieving factors: resting, sitting.  Takes medicine for pain  PRECAUTIONS: None  WEIGHT BEARING RESTRICTIONS: No  FALLS:  Has patient fallen in last 6  months? Yes. Number of falls 2 One fall was in the bathroom and then another in a parking lot.  LIVING ENVIRONMENT: Lives with: lives with their daughter and she lives in [place] and stays with daughter here in [place] Lives in: House/apartment Stairs: 2 story home with bedroom on 2nd floor  Has following equipment at home: Single point cane  OCCUPATION: Retired  PLOF: Independent in daily activity.  Reported no specific hobbies.   PATIENT GOALS: Reduce pain, walk better.   OBJECTIVE:   PATIENT SURVEYS:  Patient-Specific Activity Scoring Scheme  0 represents "unable to perform." 10 represents "able to perform at prior level. 0 1 2 3 4 5 6 7 8 9  10 (Date and Score)   Activity 07/31/2023  09/06/2023  1. Standing prolonged  0  5  2. walking  3  5  3. stairs 2 2  4.chair transfers 2 5  5.    Score 1.75 avg 4.25 avg   Total score = sum of the activity scores/number of activities Minimum detectable change (90%CI) for average score = 2 points Minimum detectable change (90%CI) for single activity score = 3 points  SCREENING FOR RED FLAGS: 07/31/2023 Bowel or bladder incontinence: No Cauda equina syndrome: No  COGNITION: 07/31/2023 Overall cognitive status: WFL normal      SENSATION: 07/31/2023 No specific testing today  MUSCLE LENGTH: 07/31/2023 No specific measurements.   POSTURE:  07/31/2023 rounded shoulders, forward head, decreased lumbar lordosis, increased thoracic kyphosis, and flexed trunk   PALPATION: 07/31/2023 No specific testing today  LOWER EXTREMITY ROM:      Right 07/31/2023 Left 07/31/2023  Hip flexion    Hip extension    Hip abduction    Hip adduction    Hip internal rotation    Hip external rotation    Knee flexion    Knee extension    Ankle dorsiflexion    Ankle plantarflexion    Ankle inversion    Ankle eversion     (Blank rows = not tested)  LOWER EXTREMITY MMT:    MMT Right 06/29/2023 Left 06/29/2023  Right 07/31/2023 Left 07/31/2023 Right 08/31/2023 Left 08/31/2023 Right 10/19/2023 Left 10/19/2023  Hip flexion 4/5 4/5 5/5 4/5   5/5 5/5  Hip extension          Hip abduction          Hip adduction          Hip internal rotation          Hip external rotation          Knee flexion 4/5 3+/5 5/5 5/5   5/5 5/5  Knee extension 4/5 3+/5 4/5 4/5 4/5 4/5 4+/5 4+/5  Ankle dorsiflexion 4/5 3+/5 4/5 4/5      Ankle plantarflexion  Ankle inversion          Ankle eversion           (Blank rows = not tested)  10/19/2023:  Seated isometric hip abduction holds painless with moderate strength.   SPECIAL TESTS:  07/31/2023 No specific testing today  FUNCTIONAL TESTS:  10/19/2023: Hands required in sit to stand to sit transfer from 18 inch chair. 2 min walk test:    Lars Testing:    10/19/23 0001  Berg Balance Test  Sit to Stand 3  Standing Unsupported 4  Sitting with Back Unsupported but Feet Supported on Floor or Stool 4  Stand to Sit 4  Transfers 3  Standing Unsupported with Eyes Closed 4  Standing Unsupported with Feet Together 4  From Standing, Reach Forward with Outstretched Arm 2  From Standing Position, Pick up Object from Floor 3  From Standing Position, Turn to Look Behind Over each Shoulder 3  Turn 360 Degrees 2  Standing Unsupported, Alternately Place Feet on Step/Stool 1  Standing Unsupported, One Foot in Front 3  Standing on One Leg 1  Total Score 41     10/18/2023: TUG with FWW:  29.5 seconds  10/02/2023: Able to perform 18 inch chair with airex pad sit to stand to sit without UE.   09/25/2023: 2 min walk test:  72 ft with SPC TUG with SPC:  51.73 seconds   09/06/2023:  BERG Testing 2 min walk test: 103 ft with [facility]   09/06/23 0001  Berg Balance Test  Sit to Stand 3  Standing Unsupported 4  Sitting with Back Unsupported but Feet Supported on Floor or Stool 4  Stand to Sit 3  Transfers 3  Standing Unsupported with Eyes Closed 4  Standing Unsupported  with Feet Together 4  From Standing, Reach Forward with Outstretched Arm 3  From Standing Position, Pick up Object from Floor 1  From Standing Position, Turn to Look Behind Over each Shoulder 2  Turn 360 Degrees 2  Standing Unsupported, Alternately Place Feet on Step/Stool 1  Standing Unsupported, One Foot in Front 3  Standing on One Leg 1  Total Score 38     08/29/2023: TUG with SPC in Rt UE:   31.39 seconds   08/11/2023: 18 inch chair with foam transfers without UE   07/31/2023 TUG with SPC in Rt UE;  34 seconds    07/31/23 0001  Berg Balance Test  Sit to Stand 2  Standing Unsupported 4  Sitting with Back Unsupported but Feet Supported on Floor or Stool 4  Stand to Sit 2  Transfers 3  Standing Unsupported with Eyes Closed 4  Standing Unsupported with Feet Together 3  From Standing, Reach Forward with Outstretched Arm 1  From Standing Position, Pick up Object from Floor 1  From Standing Position, Turn to Look Behind Over each Shoulder 2  Turn 360 Degrees 1  Standing Unsupported, Alternately Place Feet on Step/Stool 0  Standing Unsupported, One Foot in Front 2  Standing on One Leg 1  Total Score 30    07/03/2023: BERG testing: 38/56  06/29/2023 eval: 5 times sit to stand: 45.62 sec with BUE support 10 meter walk test: 32.85 sec (1 ft/sec)  GAIT: 10/18/2023: Ambulation in clinic with FWW with improved gait speed compared to [facility] use.    09/06/2023: Ambulation with SPC, reduced gait speed overall noted still.   07/31/2023 Household distances in clinic c SPC in Rt hand.  Reduced step length noted bilaterally with  forward head/trunk flexion, reduced gait speed and wider base of support.                                                                                                                                                                                                                    TODAY'S TREATMENT      DATE:  10/19/2023 Therex: Nustep Lvl 5 10 mins UE/LE  without resting.  Seated LAQ 2 x 10 bilateral 2.5 lbs  Seated alternating heel /toe lifts with SBA to CGA at times.    Physical Performance testing BERG testing as recorded above.   Sit to stan to sit testing noted. 2 min walk test with FWW.  Time spent for activity and education of techniques/instruction.   TODAY'S TREATMENT      DATE:  10/18/2023 Therex: Seated marching alternating to 4 inch step x 10 bilateral  Nustep Lvl 5 10 mins UE/LE for endurance, Rom.    Neuro Re-ed Forward stepping with weight shift to front leg with verbal and tactile cues for initiate stepping.  CGA to min A at times in // bars x 10 bilateral  Retro stepping with weight shift to front leg with verbal and tactile cues for initiate stepping.  CGA to min A at times in // bars x 10 bilateral  Tandem stance modified with front foot elevated step forward (not in line) 1 min x 1 bilateral with CGA  TherActivity Sit to stand to sit from 18 inch chair with minimal HHA x 12 throughout visit.  Sit to stand TUG x 1 c FWW - used to analysis and movement practice.    TODAY'S TREATMENT      DATE:  10/09/2023 Therex: Nustep lvl 5 5 mins, rest break for 2 mins with inhaler use.  Check of pulse ox at 99% while resting.  5 more mins at lvl 4.  94 % oberved at end of 2nd 5 mins.  Seated LAQ with contralateral leg movement opposite with end range pauses x 10 bilateral, 2 lb weight x 10 bilateral  Seated quad set 5 sec hold x 10 bilateral with back support  Seated quad set with SLR 3 x 5, performed bilaterally with back support.  Seated alternating heel/toe lifts x 10 each way with back support.      TODAY'S TREATMENT      DATE:  10/05/2023 Seated straight leg raises 3 sets of 5 for 3 seconds Seated TKE 10 x each side NuStep Level 4 Intervals 6 minutes  total with :15 rests for breathing as needed (4 breaks taken)  Functional Activities: Double Leg Press 25# 20 x limited range to minimize left knee cracking   TODAY'S  TREATMENT      DATE:  10/02/2023 Therex: Seated LAQ 2 lb 2 x 15 bilaterally  Seated alternating marching green band 2 x 10 bilaterally Seated green band hip clam shell c isometric hold contralateral leg x 20 bilateral   TherActivity(to improve functional reach, control, and progressive mobility improvements of daily life) 18 inch chair transfer with airex pad no UE assist with slow lowering focus x5 - additional time for planning and initiation of movement.   Neuro Re-ed Tandem stance in // bars with occasional to moderate HHA 1 min x 1 bilateral Forward BIG inspired stepping x 10 bilateral with CGA and constant cues verbally for activity.  Difficulty in generating initial movement of activity Alternate toe tapping 4 inch step    HOME EXERCISE PROGRAM: Access Code: [email or web address] URL: [email or web address] Date: 08/16/2023 Prepared by: Lamar Ivory  Program Notes stop if pain occurs  Exercises - Seated Long Arc Quad  - 1-2 x daily - 7 x weekly - 1-2 sets - 10 reps - 2 hold - Seated Hamstring Curls with Resistance  - 1 x daily - 7 x weekly - 1-2 sets - 10 reps - Seated March  - 1-2 x daily - 7 x weekly - 1-2 sets - 10 reps - Sit to Stand with Counter Support  - 1 x daily - 5 x weekly - 2 sets - 5 reps - Seated Hip Abduction with Resistance  - 1 x daily - 7 x weekly - 1-2 sets - 10 reps - Small Range Straight Leg Raise  - 2 x daily - 7 x weekly - 3-5 sets - 5 reps - 3 seconds hold  ASSESSMENT:  CLINICAL IMPRESSION: The patient has attended 6 visits since last progress note, 24 visits over the course of treatment cycle. See objective data above for updated information regarding current presentation.  Pt has demonstrated improvements in BERG testing compared to previous assessments as well as some improvements in LE strength.  Knee pain remains as a severe limiting factor for daily activity and stability.  Progress was also inhibited by recent ED visit for breathing  complications in end of June 2025.  Continued medical necessity indicated for skilled PT services at this time to help continue to address pain complaints, mobility control and balance, strength for improved safety with independent progressive mobility.    OBJECTIVE IMPAIRMENTS: Abnormal gait, decreased activity tolerance, decreased balance, decreased coordination, decreased endurance, decreased mobility, difficulty walking, decreased ROM, decreased strength, increased fascial restrictions, impaired perceived functional ability, impaired flexibility, impaired UE functional use, improper body mechanics, postural dysfunction, and pain.   ACTIVITY LIMITATIONS: carrying, lifting, bending, sitting, standing, squatting, stairs, transfers, bed mobility, reach over head, hygiene/grooming, and locomotion level  PARTICIPATION LIMITATIONS: meal prep, cleaning, laundry, interpersonal relationship, driving, shopping, and community activity  PERSONAL FACTORS: Medical history: arthritis, asthma, HTN, hyperlipidemia, memory loss, time since onset, multiple body parts involved are also affecting patient's functional outcome.   REHAB POTENTIAL: Fair to good  CLINICAL DECISION MAKING: Evolving/moderate complexity  EVALUATION COMPLEXITY: Moderate   GOALS: Goals reviewed with patient? Yes  SHORT TERM GOALS: (target date for Short term goals are 3 weeks 08/21/2023)  1. Patient will demonstrate independent use of home exercise program to maintain progress from in clinic treatments.  Goal status: Met 10/05/2023  LONG TERM GOALS: (  target dates for all long term goals are 8 weeks  12/14/2023 )   1. Patient will demonstrate/report pain at worst less than or equal to 2/10 to facilitate minimal limitation in daily activity secondary to pain symptoms.  Goal status: on going 10/19/2023   2. Patient will demonstrate independent use of home exercise program to facilitate ability to maintain/progress functional gains from  skilled physical therapy services.  Goal status: on going 10/19/2023   3. Patient will demonstrate Patient specific functional scale avg > or = 7/10 to indicate reduced disability due to condition.   Goal status: on going 10/19/2023   4. Patient will demonstrate Lars testing > 45 to indicate reduced fall risk for daily mobility.   Goal status: on going 10/19/2023   5.  Patient will demonstrate TUG < 15 seconds to indicate reduced fall risk for daily mobility.   Goal status: on going 10/19/2023   6.  Patient will demonstrate bilateral LE MMT 5/5 to facilitate transfers, ambulation.  Goal status: on going 10/19/2023   7.  Patient will demonstrate reciprocal gait pattern up/down stairs with hand rail and cane for household activity.  Goal Status: on going 10/19/2023  PLAN:  PT FREQUENCY: 2x/week  PT DURATION: 8 weeks (12 visits but added extra 2 weeks for scheduling )  PLANNED INTERVENTIONS: Can include 02853- PT Re-evaluation, 97110-Therapeutic exercises, 97530- Therapeutic activity, 97112- Neuromuscular re-education, 97535- Self Care, [id number]- Manual therapy, [phone]- Gait training,[id number]- Electrical stimulation (unattended), 97750 Physical performance testing,    Patient/Family education, Balance training, Stair training, Taping, Dry Needling, Joint mobilization, Joint manipulation, Spinal manipulation, Spinal mobilization, Scar mobilization, Vestibular training, Visual/preceptual remediation/compensation, DME instructions, Cryotherapy, and Moist heat.  All performed as medically necessary.  All included unless contraindicated  PLAN FOR NEXT SESSION:  Strength, balance improvements with attempts to reduce knee pain in mobility.   Ozell Silvan, PT, DPT, OCS, ATC 10/19/23  4:10 PM    PHYSICAL THERAPY DISCHARGE SUMMARY  Visits from Start of Care: 25  Current functional level related to goals / functional outcomes: See note   Remaining deficits: See note   Education /  Equipment: HEP  Patient goals were partially met. Patient is being discharged due to not returning since the last visit.  Ozell Silvan, PT, DPT, OCS, ATC 11/16/23  9:50 AM         Referring diagnosis? M25.561,M25.562 (ICD-10-CM) - Acute pain of both knees Treatment diagnosis? (if different than referring diagnosis) M25.562, M25.561, M54.59, M62.81 What was this (referring dx) caused by? []  Surgery []  Fall [x]  Ongoing issue [x]  Arthritis []  Other: ____________  Laterality: []  Rt []  Lt [x]  Both  Check all possible CPT codes:  *CHOOSE 10 OR LESS*    See Planned Interventions listed in the Plan section of the Evaluation.

## 2023-10-27 ENCOUNTER — Encounter: Payer: Self-pay | Admitting: Rehabilitative and Restorative Service Providers"

## 2023-10-27 ENCOUNTER — Encounter: Admitting: Rehabilitative and Restorative Service Providers"

## 2023-11-02 ENCOUNTER — Encounter: Admitting: Physical Therapy

## 2023-11-06 ENCOUNTER — Encounter: Admitting: Rehabilitative and Restorative Service Providers"

## 2023-11-08 ENCOUNTER — Encounter: Admitting: Rehabilitative and Restorative Service Providers"

## 2023-11-13 ENCOUNTER — Encounter: Admitting: Rehabilitative and Restorative Service Providers"

## 2023-11-14 ENCOUNTER — Ambulatory Visit: Admitting: Internal Medicine

## 2023-11-15 ENCOUNTER — Encounter: Admitting: Rehabilitative and Restorative Service Providers"

## 2023-11-21 ENCOUNTER — Encounter: Admitting: Rehabilitative and Restorative Service Providers"

## 2023-11-23 ENCOUNTER — Encounter: Admitting: Rehabilitative and Restorative Service Providers"

## 2023-12-04 NOTE — Progress Notes (Unsigned)
 HPI F never smoker, mother of [name], NP, followed for Bronchiectasis/ MAIC/ M abscessus)// cavitary nodules,  HTN, DVT, CAD, Aortic Atherosclerosis,  Peripheral Neuropathy, Rheumatoid Arthritis , Osteoporosis, Hyperlipidemia, Glaucoma, L Breast Mass, Environmental Allergies, Memory Loss, Covid Infection Jan 2022,  Sputum cx 09/18/20+ M. Abscessus. HST 11/01/20- AHI11.4/ hr, desaturation to 70%, body weight 188 lbs Walk Test on Room Air- 2 laps very slowly - lowest O2 sat 98%, Max Hr 80. Watching her slow walk- issue may be residual stiffness and pain in hips from hx bilateral total hip replacement. Dr Campbell/ ID had told her there was no effective treatment for her atypical AFB infection       ========================================================================================================================================  09/14/23- 83 yoF never smoker, mother of [name], NP, followed for Bronchiectasis/ MAIC/ M abscessus), OSA/ OAP,  Dyspnea,  complicated by  HTN, hxDVT, CAD, Aortic Atherosclerosis, CHF,  Peripheral Neuropathy, Rheumatoid Arthritis , Osteoporosis, Hyperlipidemia, Glaucoma, L Breast Mass, Environmental Allergies, Memory Loss, Covid Infection Jan 2022,   Dr Campbell/ ID had told her there was no effective treatment for her atypical AFB infection  (M.abscessus)                                       - Ventolin  hfa,  Neb albuterol , Breo 200,  Flonase, prednisone  5 mg daily,        note Timoptic  ED 6/24- hx 3 days, malaise, weakness, cough productive brown. WBC was 8000, respiratory viral panel neg. CXR stable with cavitary nodule suspected to be atypical infection. K was 2.9> supplemented orally. Sent home presumptive viral URI. Discussed the use of AI scribe software for clinical note transcription with the patient, who gave verbal consent to proceed.  History of Present Illness   Ansleigh Safer is an 83 year old female who presents with wheezing and  respiratory symptoms. She is accompanied by her daughter and granddaughter. An acute illness began 3-4 days ago with weakness, fatigue, exertional dyspnea and cough productive o brown sputum. She experiences persistent wheezing, which improves with home nebulizer use. During a recent emergency room visit, she was discharged with wheezing and chest rattling. She continues to produce brownish sputum, which appears infected. She denies current shortness of breath but recalls previous episodes. She has a history of COVID-19 hospitalization. Her current medications include potassium pills, albuterol  nebulizer treatments four times daily, and a daily dose of 5 mg prednisone . She has not been eating well and has experienced occasional diarrhea.     Assessment and Plan:    Acute Bronchitis Wheezing and respiratory symptoms improved but persist with rattling sounds. Shortness of breath improved. Possible viral infection or air quality issues suspected. Chest x-ray showed no significant new findings. - Prescribe prednisone  taper. - Continue albuterol  nebulizer treatments at home. - Administer DuoNeb treatment in office. - Prescribe azithromycin  (Z-Pak) and prednisone  taper.  Cough with brown sputum Persistent cough with brown sputum suggests possible bacterial infection. Respiratory virus panel negative. - Prescribe azithromycin  (Z-Pak).   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 Currently taking potassium pills at home. - Continue potassium supplementation as prescribed.  Diarrhea Intermittent diarrhea possibly related to previous antibiotic use. Azithromycin  chosen for lower gastrointestinal side effects. - Prescribe azithromycin  (Z-Pak).  Side pain Intermittent side pain in L hip area, present for months. Not a new symptom. - Make PCP  aware if it continues     12/05/23- 83 yoF never  smoker, mother of [name], NP, followed for Bronchiectasis/ MAIC/ M abscessus), OSA/ OAP,  RUL Cavitary nodule, Dyspnea,  complicated by  HTN, hxDVT, CAD, Aortic Atherosclerosis, CHF,  Peripheral Neuropathy, Rheumatoid Arthritis , Osteoporosis, Hyperlipidemia, Glaucoma, L Breast Mass, Environmental Allergies, Memory Loss, Covid Infection Jan 2022,   Dr Campbell/ ID had told her there was no effective treatment for her atypical AFB infection  (M.abscessus)                                       - Ventolin  hfa,  Neb albuterol , Breo 200,  Flonase, prednisone  5 mg daily,        note Timoptic    Completed PT for knee pain and unsteady gait. CPAP auto 5-15/ [employer] Download compliance 93%, AHI 1.2/hr Discussed the use of AI scribe software for clinical note transcription with the patient, who gave verbal consent to proceed.  History of Present Illness   Aubryanna Nesheim is an 83 year old female, followed for Atypical AFB infection/ lung nodules, asthmatic bronchitis, , and OSA. who presents for follow-up on respiratory issues. Daughter is here. She had completed Physical Therapy for dyspnea on exertion, knee pain.  She experiences shortness of breath, particularly when walking short distances, leading to heavy breathing. She uses a CPAP machine and is prescribed DuoNeb nebulizer medication, which contains ipratropium and albuterol . An albuterol  inhaler is used as a rescue medication. 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  Recent CT shows stable cavitary nodule.  Occasional central chest discomfort occurs, described as 'hurting right here in the center,' sometimes in the afternoon, especially when sitting up. The discomfort does not radiate to her back and is sometimes associated with eating. Tums may provide some relief.  There is no frequent coughing, but there is occasional  brownish sputum production at night. Her last chest x-ray was in June.     CXR 09/12/23 IMPRESSION: Stable cavitary lesion in the right upper lobe.  Assessment and Plan:    Chronic Asthmatic Bronchitis Chronic dyspnea and cough with brown sputum persist. Previous physical therapy minimally effective. No exacerbation or rattling noted. - Order chest x-ray to evaluate lung status. - Refill Duoneb for nebulizer use. - Discontinue albuterol  for nebulizer. - Continue albuterol  inhaler for rescue use.  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 - Order chest x-ray to monitor nodule stability.  Obstructive sleep apnea on CPAP Obstructive sleep apnea well-managed with CPAP. CPAP download showed effective usage and symptom control.  Heartburn (gastroesophageal reflux symptoms) Intermittent chest pain likely due to heartburn, relieved by Tums. No esophageal obstruction or cardiac pain signs. - Recommend continued use of Tums as needed for symptom relief.  Heart murmur Persistent loud heart murmur. No cardiac-related chest pain indicated by symptoms. Cardiology continues to follow.     ROS-see HPI   + = positive Constitutional:    weight loss, night sweats, fevers, chills, +fatigue, lassitude. HEENT:    headaches, difficulty swallowing, tooth/dental problems, sore throat,       +sneezing, itching, ear ache, +nasal congestion, post nasal drip, snoring CV:    chest pain, orthopnea, PND, +swelling in  lower extremities, anasarca,                                   dizziness, palpitations Resp:   +shortness of breath with exertion or at rest.                productive cough,   non-productive cough, coughing up of blood.              change in color of mucus.  wheezing.   Skin:    +rash or lesions. GI:  No-   heartburn, indigestion, abdominal pain, nausea,  vomiting, diarrhea,                 change in bowel habits, loss of appetite GU: dysuria, change in color of urine, no urgency or frequency.   flank pain. MS:   joint pain, stiffness, decreased range of motion, back pain. Neuro-     nothing unusual Psych:  change in mood or affect.  depression or anxiety.   memory loss.  OBJ- Physical Exam General- Alert, Oriented, Affect-appropriate, Distress- none acute, + obese, +walks slowly with cane Skin- rash-none, lesions- none, excoriation- none Lymphadenopathy- none Head- atraumatic            Eyes- Gross vision intact, PERRLA, conjunctivae and secretions clear            Ears- Hearing, canals-normal            Nose- Clear, no-Septal dev, mucus, polyps, erosion, perforation             Throat- Mallampati IV , mucosa clear , drainage- none, tonsils- atrophic, +teeth Neck- flexible , trachea midline, no stridor , thyroid nl, carotid no bruit Chest - symmetrical excursion , unlabored           Heart/CV- RRR , + murmur +1-2 S, no gallop  , no rub, nl s1 s2                           - JVD- none , edema- none, stasis changes- none, varices- none           Lung- wheeze-none, cough+scant/nonproductive here , dullness-none, rub- none           Chest wall-  Abd-  Br/ Gen/ Rectal- Not done, not indicated Extrem- cyanosis- none, clubbing, none, atrophy- none, strength- nl Neuro- grossly intact to observation

## 2023-12-05 ENCOUNTER — Ambulatory Visit: Admitting: Internal Medicine

## 2023-12-05 ENCOUNTER — Encounter: Payer: Self-pay | Admitting: Internal Medicine

## 2023-12-05 ENCOUNTER — Ambulatory Visit (INDEPENDENT_AMBULATORY_CARE_PROVIDER_SITE_OTHER)

## 2023-12-05 ENCOUNTER — Ambulatory Visit: Admitting: Orthopedic Surgery

## 2023-12-05 VITALS — BP 142/62 | HR 74 | Temp 98.3°F | Ht 64.0 in | Wt 172.0 lb

## 2023-12-05 DIAGNOSIS — G4733 Obstructive sleep apnea (adult) (pediatric): Secondary | ICD-10-CM | POA: Diagnosis not present

## 2023-12-05 DIAGNOSIS — R911 Solitary pulmonary nodule: Secondary | ICD-10-CM

## 2023-12-05 DIAGNOSIS — J4489 Other specified chronic obstructive pulmonary disease: Secondary | ICD-10-CM | POA: Diagnosis not present

## 2023-12-05 DIAGNOSIS — K219 Gastro-esophageal reflux disease without esophagitis: Secondary | ICD-10-CM

## 2023-12-05 DIAGNOSIS — M25561 Pain in right knee: Secondary | ICD-10-CM | POA: Diagnosis not present

## 2023-12-05 DIAGNOSIS — M25562 Pain in left knee: Secondary | ICD-10-CM | POA: Diagnosis not present

## 2023-12-05 DIAGNOSIS — M170 Bilateral primary osteoarthritis of knee: Secondary | ICD-10-CM | POA: Diagnosis not present

## 2023-12-05 DIAGNOSIS — R011 Cardiac murmur, unspecified: Secondary | ICD-10-CM

## 2023-12-05 MED ORDER — IPRATROPIUM-ALBUTEROL 0.5-2.5 (3) MG/3ML IN SOLN: 3.0000 mL | Freq: Four times a day (QID) | RESPIRATORY_TRACT | 12 refills | Status: AC | PRN

## 2023-12-05 NOTE — Patient Instructions (Signed)
 Order- CXR  dx lung nodule   Script sent for DuoNeb (ipratropium-albuterol ) neb solution   - you can use this every 6 hours, when needed, instead of albuterol  neb solution.  You are doing great with CPAP- we can continue auto 5-15  Please call if we can help

## 2023-12-06 ENCOUNTER — Encounter: Payer: Self-pay | Admitting: Orthopedic Surgery

## 2023-12-06 DIAGNOSIS — M170 Bilateral primary osteoarthritis of knee: Secondary | ICD-10-CM

## 2023-12-06 MED ORDER — LIDOCAINE HCL 1 % IJ SOLN
5.0000 mL | INTRAMUSCULAR | Status: AC | PRN
  Administered 2023-12-06: 5 mL

## 2023-12-06 MED ORDER — METHYLPREDNISOLONE ACETATE 40 MG/ML IJ SUSP
40.0000 mg | INTRAMUSCULAR | Status: AC | PRN
  Administered 2023-12-06: 40 mg via INTRA_ARTICULAR

## 2023-12-06 NOTE — Progress Notes (Signed)
 Office Visit Note   Patient: Teresa French           Date of Birth: [birth year]           MRN: [id number] Visit Date: 12/05/2023              Requested by: Sim Emery CROME, MD [phone] [address]  [place],  [place] [address] PCP: Sim Emery CROME, MD  Chief Complaint  Patient presents with   Left Knee - Pain   Right Knee - Pain      HPI: Discussed the use of AI scribe software for clinical note transcription with the patient, who gave verbal consent to proceed.  History of Present Illness Teresa French is an 83 year old female with bilateral knee osteoarthritis who presents with knee pain.  She experiences persistent bilateral knee pain, with the right knee being more painful than the left. The pain is constant and can reach a level of ten on a pain scale. Previous injections have not provided significant relief.  Radiographs of both knees reveal bone-on-bone contact in the medial joint line of the right knee and the medial lateral joint line of the left knee. There are osteophytic bone spurs in all three compartments, subchondral cysts, and subchondral sclerosis.  She has a history of using custom-made orthotics for foot support but is now considering over-the-counter options due to cost and availability.  She prefers to avoid knee replacement surgery due to concerns about recovery and potential risks. She is interested in alternative treatments such as injections.     Assessment & Plan: Visit Diagnoses: No diagnosis found.  Plan: Assessment and Plan Assessment & Plan Bilateral knee osteoarthritis Chronic osteoarthritis with bone on bone contact and osteophytic spurs. Severe pain, especially in the left knee. Limited relief from previous cortisone injections. She declined total knee arthroplasty due to surgical risks and prefers non-surgical options. - Administer cortisone injections in both knees. - Request authorization for hyaluronic acid injection for the left  knee.  Right posterior tibial tendon dysfunction Pain in the right posterior tibial tendon area. Previous orthotics provided relief but are not currently in use. - Recommend purchasing sole orthotics with cork from [facility].      Follow-Up Instructions: Return in about 4 weeks (around 01/02/2024).   Ortho Exam  Patient is alert, oriented, no adenopathy, well-dressed, normal affect, normal respiratory effort. Physical Exam MUSCULOSKELETAL: Crepitus with range of motion in both knees.  Collaterals and cruciates are stable.  No cellulitis.  This patient is diagnosed with osteoarthritis of the knee(s).    Radiographs show evidence of joint space narrowing, osteophytes, subchondral sclerosis and/or subchondral cysts.  This patient has knee pain which interferes with functional and activities of daily living.    This patient has experienced inadequate response, adverse effects and/or intolerance with conservative treatments such as acetaminophen , NSAIDS, topical creams, physical therapy or regular exercise, knee bracing and/or weight loss.   This patient has experienced inadequate response or has a contraindication to intra articular steroid injections for at least 3 months.   This patient is not scheduled to have a total knee replacement within 6 months of starting treatment with viscosupplementation.       Imaging: No results found. No images are attached to the encounter.  Labs: Lab Results  Component Value Date   HGBA1C 6.8 (H) 06/06/2023   HGBA1C 6.2 (H) 01/23/2018   HGBA1C 5.6 10/29/2015   GRAMSTAIN Gram positive cocci in chains (A) 11/03/2020   LABORGA Normal Oropharyngeal Flora  10/18/2016     Lab Results  Component Value Date   ALBUMIN 3.1 (L) 09/12/2023   ALBUMIN 4.3 04/08/2022   ALBUMIN 4.5 07/10/2017    Lab Results  Component Value Date   MG 1.7 09/12/2023   No results found for: VD25OH  No results found for: PREALBUMIN    Latest Ref Rng & Units  09/12/2023   12:00 AM 06/06/2023    3:17 PM 04/08/2022    2:06 PM  CBC EXTENDED  WBC 4.0 - 10.5 K/uL 8.0  5.2  8.7   RBC 3.87 - 5.11 MIL/uL 3.89  3.95  4.28   Hemoglobin 12.0 - 15.0 g/dL 88.8  88.3  87.6   HCT 36.0 - 46.0 % 33.3  34.0  37.3   Platelets 150 - 400 K/uL 169  230.0  277.0   NEUT# 1.7 - 7.7 K/uL 5.1  3.3  5.8   Lymph# 0.7 - 4.0 K/uL 1.8  1.4  2.1      There is no height or weight on file to calculate BMI.  Orders:  No orders of the defined types were placed in this encounter.  No orders of the defined types were placed in this encounter.    Procedures: Large Joint Inj: bilateral knee on 12/06/2023 7:35 AM Indications: pain and diagnostic evaluation Details: 22 G 1.5 in needle, anteromedial approach  Arthrogram: No  Medications (Right): 5 mL lidocaine  1 %; 40 mg methylPREDNISolone  acetate 40 MG/ML Medications (Left): 5 mL lidocaine  1 %; 40 mg methylPREDNISolone  acetate 40 MG/ML Outcome: tolerated well, no immediate complications Procedure, treatment alternatives, risks and benefits explained, specific risks discussed. Consent was given by the patient. Immediately prior to procedure a time out was called to verify the correct patient, procedure, equipment, support staff and site/side marked as required. Patient was prepped and draped in the usual sterile fashion.      Clinical Data: No additional findings.  ROS:  All other systems negative, except as noted in the HPI. Review of Systems  Objective: Vital Signs: There were no vitals taken for this visit.  Specialty Comments:  No specialty comments available.  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   COVID-19 09/16/2020   Asthmatic bronchitis 09/08/2020   OSA (obstructive sleep apnea) 09/08/2020   DVT (deep venous thrombosis) (HCC) 02/12/2019   Breast mass, left 07/10/2017   HTN (hypertension),  benign 07/10/2017   Peripheral neuropathy 03/08/2016   Borderline diabetes    Rheumatoid arthritis (HCC) 10/29/2015   DJD (degenerative joint disease) 10/29/2015   Osteoporosis 10/29/2015   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   Osteoarthritis of right hip 11/21/2014   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   Past Medical History:  Diagnosis Date   Arthritis    Asthma    Borderline diabetes    Breast mass, left 07/10/2017   06/19/17: mammos: lobulated mass sup & lat to nipple; US : 1.4x0.5x1.0 cm  Bi-RADS4   Complication of anesthesia    I SLEEP A VERY VERY LONG TIME   Eczema    Environmental allergies    Glaucoma    History of transfusion    HTN (hypertension), benign 07/10/2017   Hyperlipidemia    Hypertension    Memory loss    Tingling    OF FEET    Family History  Problem Relation Age of Onset   Heart disease Mother  Lung cancer Father    Breast cancer Paternal Aunt        46s   Heart disease Maternal Grandmother    Lung cancer Maternal Grandfather    Hypertension Maternal Grandfather    Breast cancer Cousin 33   Breast cancer Cousin 47    Past Surgical History:  Procedure Laterality Date   BREAST BIOPSY Bilateral    3-5 EACH BREAST   BREAST CYST ASPIRATION     CERVICAL LAMINECTOMY     DILATION AND CURETTAGE OF UTERUS     GANGLION CYST EXCISION     X3   JOINT REPLACEMENT  2007   left hip   TOTAL HIP ARTHROPLASTY Right 11/21/2014   Procedure: RIGHT TOTAL HIP ARTHROPLASTY ANTERIOR APPROACH;  Surgeon: Lonni CINDERELLA Poli, MD;  Location: [facility];  Service: Orthopedics;  Laterality: Right;   Social History   Occupational History   Not on file  Tobacco Use   Smoking status: Never   Smokeless tobacco: Never  Vaping Use   Vaping status: Never Used  Substance and Sexual Activity   Alcohol use: No   Drug use: No   Sexual activity: Not on file

## 2023-12-07 ENCOUNTER — Ambulatory Visit: Payer: Self-pay | Admitting: Internal Medicine

## 2023-12-08 ENCOUNTER — Encounter: Payer: Self-pay | Admitting: Internal Medicine

## 2024-01-22 ENCOUNTER — Encounter: Payer: Self-pay | Admitting: Radiology

## 2024-02-11 LAB — COLOGUARD: COLOGUARD: NEGATIVE

## 2024-04-11 ENCOUNTER — Telehealth: Payer: Self-pay

## 2024-04-11 NOTE — Telephone Encounter (Signed)
 Teresa French was returning your call concerning patient.  Would like a call back when you can.  Cb# [phone].  Thank you.

## 2024-04-12 ENCOUNTER — Telehealth: Payer: Self-pay | Admitting: Radiology

## 2024-04-12 NOTE — Telephone Encounter (Signed)
 I called and lm on vm to advise that we are happy to see the pt in the office at any time. To call the office and whomever answers the phone they can make an appt for the pt whenever the pt wants to come in.

## 2024-04-12 NOTE — Telephone Encounter (Signed)
 Rosaline, patients daughter left message on triage requesting call back from Autumn.  States it is about knee injections for mother.  Call back [phone]

## 2024-04-12 NOTE — Telephone Encounter (Signed)
 I called and lm on vm to advise that if the pt wants to have knee injections she an call the office and we would be happy to make an appt for her.

## 2024-04-26 ENCOUNTER — Telehealth: Payer: Self-pay

## 2024-04-26 ENCOUNTER — Other Ambulatory Visit: Payer: Self-pay

## 2024-04-26 DIAGNOSIS — M170 Bilateral primary osteoarthritis of knee: Secondary | ICD-10-CM

## 2024-04-26 NOTE — Telephone Encounter (Signed)
 No, but I can put the order in.

## 2024-04-26 NOTE — Telephone Encounter (Signed)
 Called and left a message for patients daughter Rosaline to Cb to schedule for gel injection.  Okay to schedule patient.  Thank you.

## 2024-04-26 NOTE — Telephone Encounter (Signed)
 Did we send information to you about gel injections for this pt?

## 2024-04-26 NOTE — Telephone Encounter (Signed)
 Patients daughter calling asking if her gel injections have been approved It doesn't look like there is an order for those? I saw your last message about an office visit but she said she has been seen several times, wondering if we can just order the gel injections for her that she is in a lot of pain

## 2024-04-26 NOTE — Telephone Encounter (Signed)
 I called and sw pt. Advised that the order is in for the injections and we will call once we have approval to make an appt to come in the office. I had called her daughter back each time she has called and left a message on voicemail and do not get return call. The messages just said  for injections had advised that we are happy to see her in the office. Did not know that she was wanting gel injections
